# Patient Record
Sex: Male | Born: 1945
Health system: Southern US, Community
[De-identification: ages and names within clinical notes are randomized; demographics above are authoritative.]

## PROBLEM LIST (undated history)

## (undated) DIAGNOSIS — Z860100 Personal history of colon polyps, unspecified: Secondary | ICD-10-CM

## (undated) DIAGNOSIS — I714 Abdominal aortic aneurysm, without rupture, unspecified: Secondary | ICD-10-CM

## (undated) DIAGNOSIS — I2510 Atherosclerotic heart disease of native coronary artery without angina pectoris: Secondary | ICD-10-CM

## (undated) DIAGNOSIS — R1310 Dysphagia, unspecified: Secondary | ICD-10-CM

## (undated) DIAGNOSIS — I6529 Occlusion and stenosis of unspecified carotid artery: Secondary | ICD-10-CM

## (undated) DIAGNOSIS — I499 Cardiac arrhythmia, unspecified: Secondary | ICD-10-CM

## (undated) DIAGNOSIS — F209 Schizophrenia, unspecified: Secondary | ICD-10-CM

## (undated) DIAGNOSIS — I10 Essential (primary) hypertension: Secondary | ICD-10-CM

## (undated) DIAGNOSIS — M549 Dorsalgia, unspecified: Secondary | ICD-10-CM

## (undated) DIAGNOSIS — E785 Hyperlipidemia, unspecified: Secondary | ICD-10-CM

## (undated) DIAGNOSIS — R569 Unspecified convulsions: Secondary | ICD-10-CM

## (undated) DIAGNOSIS — M1990 Unspecified osteoarthritis, unspecified site: Secondary | ICD-10-CM

## (undated) DIAGNOSIS — I639 Cerebral infarction, unspecified: Secondary | ICD-10-CM

## (undated) DIAGNOSIS — Z9289 Personal history of other medical treatment: Secondary | ICD-10-CM

## (undated) DIAGNOSIS — Z8709 Personal history of other diseases of the respiratory system: Secondary | ICD-10-CM

## (undated) DIAGNOSIS — G8929 Other chronic pain: Secondary | ICD-10-CM

## (undated) HISTORY — PX: COLONOSCOPY: SHX174

## (undated) HISTORY — PX: EYE SURGERY: SHX253

## (undated) HISTORY — PX: CARPAL TUNNEL RELEASE: SHX101

## (undated) HISTORY — DX: Abdominal aortic aneurysm, without rupture, unspecified: I71.40

## (undated) HISTORY — DX: Cerebral infarction, unspecified: I63.9

## (undated) HISTORY — DX: Abdominal aortic aneurysm, without rupture: I71.4

## (undated) HISTORY — DX: Occlusion and stenosis of unspecified carotid artery: I65.29

---

## 1986-04-16 DIAGNOSIS — I639 Cerebral infarction, unspecified: Secondary | ICD-10-CM

## 1986-04-16 HISTORY — DX: Cerebral infarction, unspecified: I63.9

## 2011-08-15 DIAGNOSIS — F29 Unspecified psychosis not due to a substance or known physiological condition: Secondary | ICD-10-CM | POA: Diagnosis not present

## 2011-08-15 DIAGNOSIS — E785 Hyperlipidemia, unspecified: Secondary | ICD-10-CM | POA: Diagnosis not present

## 2011-08-15 DIAGNOSIS — I10 Essential (primary) hypertension: Secondary | ICD-10-CM | POA: Diagnosis not present

## 2011-08-15 DIAGNOSIS — F0390 Unspecified dementia without behavioral disturbance: Secondary | ICD-10-CM | POA: Diagnosis not present

## 2011-09-06 DIAGNOSIS — M2040 Other hammer toe(s) (acquired), unspecified foot: Secondary | ICD-10-CM | POA: Diagnosis not present

## 2011-09-06 DIAGNOSIS — M79609 Pain in unspecified limb: Secondary | ICD-10-CM | POA: Diagnosis not present

## 2011-09-06 DIAGNOSIS — B351 Tinea unguium: Secondary | ICD-10-CM | POA: Diagnosis not present

## 2012-05-01 DIAGNOSIS — M79609 Pain in unspecified limb: Secondary | ICD-10-CM | POA: Diagnosis not present

## 2012-05-01 DIAGNOSIS — B351 Tinea unguium: Secondary | ICD-10-CM | POA: Diagnosis not present

## 2012-08-21 DIAGNOSIS — H2510 Age-related nuclear cataract, unspecified eye: Secondary | ICD-10-CM | POA: Diagnosis not present

## 2012-08-21 DIAGNOSIS — H26019 Infantile and juvenile cortical, lamellar, or zonular cataract, unspecified eye: Secondary | ICD-10-CM | POA: Diagnosis not present

## 2012-09-03 DIAGNOSIS — H2510 Age-related nuclear cataract, unspecified eye: Secondary | ICD-10-CM | POA: Diagnosis not present

## 2012-09-17 DIAGNOSIS — E785 Hyperlipidemia, unspecified: Secondary | ICD-10-CM | POA: Diagnosis not present

## 2012-09-17 DIAGNOSIS — I10 Essential (primary) hypertension: Secondary | ICD-10-CM | POA: Diagnosis not present

## 2012-09-17 DIAGNOSIS — F29 Unspecified psychosis not due to a substance or known physiological condition: Secondary | ICD-10-CM | POA: Diagnosis not present

## 2012-09-17 DIAGNOSIS — Z131 Encounter for screening for diabetes mellitus: Secondary | ICD-10-CM | POA: Diagnosis not present

## 2012-09-24 DIAGNOSIS — H2510 Age-related nuclear cataract, unspecified eye: Secondary | ICD-10-CM | POA: Diagnosis not present

## 2012-09-24 DIAGNOSIS — H26019 Infantile and juvenile cortical, lamellar, or zonular cataract, unspecified eye: Secondary | ICD-10-CM | POA: Diagnosis not present

## 2013-03-13 DIAGNOSIS — F29 Unspecified psychosis not due to a substance or known physiological condition: Secondary | ICD-10-CM | POA: Diagnosis not present

## 2013-03-13 DIAGNOSIS — I10 Essential (primary) hypertension: Secondary | ICD-10-CM | POA: Diagnosis not present

## 2013-03-13 DIAGNOSIS — E785 Hyperlipidemia, unspecified: Secondary | ICD-10-CM | POA: Diagnosis not present

## 2013-03-13 DIAGNOSIS — Z1211 Encounter for screening for malignant neoplasm of colon: Secondary | ICD-10-CM | POA: Diagnosis not present

## 2013-04-21 DIAGNOSIS — F205 Residual schizophrenia: Secondary | ICD-10-CM | POA: Diagnosis not present

## 2013-04-24 DIAGNOSIS — D126 Benign neoplasm of colon, unspecified: Secondary | ICD-10-CM | POA: Diagnosis not present

## 2013-04-24 DIAGNOSIS — Z1211 Encounter for screening for malignant neoplasm of colon: Secondary | ICD-10-CM | POA: Diagnosis not present

## 2013-06-12 DIAGNOSIS — E785 Hyperlipidemia, unspecified: Secondary | ICD-10-CM | POA: Diagnosis not present

## 2013-06-12 DIAGNOSIS — M1710 Unilateral primary osteoarthritis, unspecified knee: Secondary | ICD-10-CM | POA: Diagnosis not present

## 2013-06-12 DIAGNOSIS — I10 Essential (primary) hypertension: Secondary | ICD-10-CM | POA: Diagnosis not present

## 2013-06-12 DIAGNOSIS — Z23 Encounter for immunization: Secondary | ICD-10-CM | POA: Diagnosis not present

## 2013-06-23 DIAGNOSIS — F205 Residual schizophrenia: Secondary | ICD-10-CM | POA: Diagnosis not present

## 2013-09-07 DIAGNOSIS — I10 Essential (primary) hypertension: Secondary | ICD-10-CM | POA: Diagnosis not present

## 2013-09-07 DIAGNOSIS — Z131 Encounter for screening for diabetes mellitus: Secondary | ICD-10-CM | POA: Diagnosis not present

## 2013-09-07 DIAGNOSIS — F29 Unspecified psychosis not due to a substance or known physiological condition: Secondary | ICD-10-CM | POA: Diagnosis not present

## 2013-09-07 DIAGNOSIS — E785 Hyperlipidemia, unspecified: Secondary | ICD-10-CM | POA: Diagnosis not present

## 2013-10-12 DIAGNOSIS — R1900 Intra-abdominal and pelvic swelling, mass and lump, unspecified site: Secondary | ICD-10-CM | POA: Diagnosis not present

## 2013-10-12 DIAGNOSIS — I739 Peripheral vascular disease, unspecified: Secondary | ICD-10-CM | POA: Diagnosis not present

## 2013-10-12 DIAGNOSIS — R0602 Shortness of breath: Secondary | ICD-10-CM | POA: Diagnosis not present

## 2013-10-12 DIAGNOSIS — I4891 Unspecified atrial fibrillation: Secondary | ICD-10-CM | POA: Diagnosis not present

## 2013-10-19 ENCOUNTER — Ambulatory Visit (INDEPENDENT_AMBULATORY_CARE_PROVIDER_SITE_OTHER): Payer: Medicare Other | Admitting: Podiatry

## 2013-10-19 ENCOUNTER — Encounter: Payer: Self-pay | Admitting: Podiatry

## 2013-10-19 VITALS — BP 162/76 | HR 86 | Resp 12

## 2013-10-19 DIAGNOSIS — F0390 Unspecified dementia without behavioral disturbance: Secondary | ICD-10-CM | POA: Diagnosis not present

## 2013-10-19 DIAGNOSIS — M79609 Pain in unspecified limb: Secondary | ICD-10-CM

## 2013-10-19 DIAGNOSIS — I10 Essential (primary) hypertension: Secondary | ICD-10-CM | POA: Diagnosis not present

## 2013-10-19 DIAGNOSIS — B351 Tinea unguium: Secondary | ICD-10-CM

## 2013-10-19 DIAGNOSIS — I4892 Unspecified atrial flutter: Secondary | ICD-10-CM | POA: Diagnosis not present

## 2013-10-19 DIAGNOSIS — E785 Hyperlipidemia, unspecified: Secondary | ICD-10-CM | POA: Diagnosis not present

## 2013-10-23 DIAGNOSIS — I4891 Unspecified atrial fibrillation: Secondary | ICD-10-CM | POA: Diagnosis not present

## 2013-10-23 DIAGNOSIS — R0602 Shortness of breath: Secondary | ICD-10-CM | POA: Diagnosis not present

## 2013-10-26 DIAGNOSIS — Z8673 Personal history of transient ischemic attack (TIA), and cerebral infarction without residual deficits: Secondary | ICD-10-CM | POA: Diagnosis not present

## 2013-10-26 DIAGNOSIS — R0989 Other specified symptoms and signs involving the circulatory and respiratory systems: Secondary | ICD-10-CM | POA: Diagnosis not present

## 2013-11-09 DIAGNOSIS — I714 Abdominal aortic aneurysm, without rupture, unspecified: Secondary | ICD-10-CM | POA: Diagnosis not present

## 2013-11-09 DIAGNOSIS — I4891 Unspecified atrial fibrillation: Secondary | ICD-10-CM | POA: Diagnosis not present

## 2013-11-12 DIAGNOSIS — R0602 Shortness of breath: Secondary | ICD-10-CM | POA: Diagnosis not present

## 2013-11-12 DIAGNOSIS — I4891 Unspecified atrial fibrillation: Secondary | ICD-10-CM | POA: Diagnosis not present

## 2013-11-18 DIAGNOSIS — F205 Residual schizophrenia: Secondary | ICD-10-CM | POA: Diagnosis not present

## 2013-11-19 DIAGNOSIS — I739 Peripheral vascular disease, unspecified: Secondary | ICD-10-CM | POA: Diagnosis not present

## 2013-11-19 DIAGNOSIS — I7389 Other specified peripheral vascular diseases: Secondary | ICD-10-CM | POA: Diagnosis not present

## 2013-12-02 ENCOUNTER — Other Ambulatory Visit: Payer: Self-pay | Admitting: Cardiology

## 2013-12-02 DIAGNOSIS — I714 Abdominal aortic aneurysm, without rupture, unspecified: Secondary | ICD-10-CM | POA: Diagnosis not present

## 2013-12-02 DIAGNOSIS — R9439 Abnormal result of other cardiovascular function study: Secondary | ICD-10-CM | POA: Diagnosis not present

## 2013-12-02 DIAGNOSIS — I739 Peripheral vascular disease, unspecified: Secondary | ICD-10-CM | POA: Diagnosis not present

## 2013-12-02 DIAGNOSIS — E785 Hyperlipidemia, unspecified: Secondary | ICD-10-CM | POA: Diagnosis not present

## 2013-12-02 DIAGNOSIS — I6529 Occlusion and stenosis of unspecified carotid artery: Secondary | ICD-10-CM

## 2013-12-02 DIAGNOSIS — I4891 Unspecified atrial fibrillation: Secondary | ICD-10-CM | POA: Diagnosis not present

## 2013-12-09 ENCOUNTER — Ambulatory Visit
Admission: RE | Admit: 2013-12-09 | Discharge: 2013-12-09 | Disposition: A | Discharge status: Home / Self Care | Payer: Medicare Other | Source: Ambulatory Visit | Attending: Cardiology | Admitting: Cardiology

## 2013-12-09 DIAGNOSIS — I714 Abdominal aortic aneurysm, without rupture, unspecified: Secondary | ICD-10-CM

## 2013-12-09 DIAGNOSIS — I658 Occlusion and stenosis of other precerebral arteries: Secondary | ICD-10-CM | POA: Diagnosis not present

## 2013-12-09 DIAGNOSIS — I6529 Occlusion and stenosis of unspecified carotid artery: Secondary | ICD-10-CM

## 2013-12-09 MED ORDER — IOHEXOL 350 MG/ML SOLN
100.0000 mL | Freq: Once | INTRAVENOUS | Status: AC | PRN
  Administered 2013-12-09: 100 mL via INTRAVENOUS

## 2013-12-09 MED ORDER — IOHEXOL 350 MG/ML SOLN
80.0000 mL | Freq: Once | INTRAVENOUS | Status: AC | PRN
  Administered 2013-12-09: 80 mL via INTRAVENOUS

## 2013-12-14 DIAGNOSIS — I10 Essential (primary) hypertension: Secondary | ICD-10-CM | POA: Diagnosis not present

## 2013-12-14 NOTE — Progress Notes (Signed)
Subjective:     Patient ID: Wesley [name], male   DOB: 1946/04/03, 68 y.o.   MRN: [id number]  HPI patient is found to have thick nailbeds 1-5 both feet that he cannot cut and are making it difficult to wear shoe gear   Review of Systems     Objective:   Physical Exam Neurovascular status unchanged with thick nailbeds 1-5 both feet    Assessment:     Mycotic nail infection with pain 1-5 both feet    Plan:     Debridement painful nail bed 1-5 both feet with no bleeding noted

## 2013-12-23 DIAGNOSIS — I4892 Unspecified atrial flutter: Secondary | ICD-10-CM | POA: Diagnosis not present

## 2013-12-23 DIAGNOSIS — R7309 Other abnormal glucose: Secondary | ICD-10-CM | POA: Diagnosis not present

## 2013-12-23 DIAGNOSIS — F29 Unspecified psychosis not due to a substance or known physiological condition: Secondary | ICD-10-CM | POA: Diagnosis not present

## 2013-12-23 DIAGNOSIS — I10 Essential (primary) hypertension: Secondary | ICD-10-CM | POA: Diagnosis not present

## 2013-12-24 DIAGNOSIS — I63239 Cerebral infarction due to unspecified occlusion or stenosis of unspecified carotid arteries: Secondary | ICD-10-CM | POA: Diagnosis not present

## 2013-12-24 DIAGNOSIS — I4891 Unspecified atrial fibrillation: Secondary | ICD-10-CM | POA: Diagnosis not present

## 2013-12-24 DIAGNOSIS — Z8673 Personal history of transient ischemic attack (TIA), and cerebral infarction without residual deficits: Secondary | ICD-10-CM | POA: Diagnosis not present

## 2013-12-24 DIAGNOSIS — R9439 Abnormal result of other cardiovascular function study: Secondary | ICD-10-CM | POA: Diagnosis not present

## 2013-12-30 ENCOUNTER — Encounter: Payer: Self-pay | Admitting: Vascular Surgery

## 2013-12-31 ENCOUNTER — Ambulatory Visit (INDEPENDENT_AMBULATORY_CARE_PROVIDER_SITE_OTHER): Payer: Medicare Other | Admitting: Vascular Surgery

## 2013-12-31 ENCOUNTER — Encounter: Payer: Self-pay | Admitting: Vascular Surgery

## 2013-12-31 VITALS — BP 158/114 | HR 77 | Resp 20 | Ht 73.0 in | Wt 197.5 lb

## 2013-12-31 DIAGNOSIS — I714 Abdominal aortic aneurysm, without rupture, unspecified: Secondary | ICD-10-CM | POA: Diagnosis not present

## 2013-12-31 DIAGNOSIS — I6529 Occlusion and stenosis of unspecified carotid artery: Secondary | ICD-10-CM

## 2013-12-31 NOTE — Progress Notes (Signed)
[facility] HISTORY AND PHYSICAL  Referring: Clayton Lefort, M.D. History of Present Illness:  Patient is a 68 y.o. year old male who presents for evaluation of asymptomatic high-grade carotid stenosis and asymptomatic infrarenal abdominal aortic aneurysm. He denies abdominal or back pain. He denies recent symptoms of TIA amaurosis or stroke. He is a former smoker quit approximately 2 years ago to The patient lives with his son that overall performance his daily activities independently. He does have a history of abdominal aortic aneurysm in his sister. Other medical problems include psychiatric history with possible history of schizophrenia, "heat stroke" in 1987 details of this event are unavailable, chronic right upper extremity tremor and weakness since his "heat stroke"  Past Medical History  Diagnosis Date  . AAA (abdominal aortic aneurysm)   . Carotid artery occlusion   . Stroke 04/16/1986    Past Surgical History  Procedure Laterality Date  . Nerve block limb Right     Social History History  Substance Use Topics  . Smoking status: Former Smoker -- 1.00 packs/day for 58 years    Types: Cigarettes    Quit date: 12/29/2011  . Smokeless tobacco: Not on file  . Alcohol Use: No    Family History Family History  Problem Relation Age of Onset  . Cancer Father   . Hyperlipidemia Father   . Hypertension Father   . Heart disease Sister   . AAA (abdominal aortic aneurysm) Sister     Allergies  No Known Allergies   Current Outpatient Prescriptions  Medication Sig Dispense Refill  . atenolol (TENORMIN) 50 MG tablet       . benazepril-hydrochlorthiazide (LOTENSIN HCT) 10-12.5 MG per tablet       . cloNIDine (CATAPRES) 0.1 MG tablet       . haloperidol (HALDOL) 5 MG tablet       . simvastatin (ZOCOR) 20 MG tablet       . trihexyphenidyl (ARTANE) 2 MG tablet       . benztropine (COGENTIN) 2 MG tablet        No current facility-administered  medications for this visit.    ROS:   General:  No weight loss, Fever, chills  HEENT: No recent headaches, no nasal bleeding, no visual changes, no sore throat  Neurologic: No dizziness, blackouts, seizures. No recent symptoms of stroke or mini- stroke. No recent episodes of slurred speech, or temporary blindness.  Cardiac: No recent episodes of chest pain/pressure, no shortness of breath at rest.  + shortness of breath with exertion.  Denies history of atrial fibrillation or irregular heartbeat  Vascular: No history of rest pain in feet.  No history of claudication.  No history of non-healing ulcer, No history of DVT   Pulmonary: No home oxygen, no productive cough, no hemoptysis,  No asthma or wheezing  Musculoskeletal:  [ ]  Arthritis, [ ]  Low back pain,  [ ]  Joint pain  Hematologic:No history of hypercoagulable state.  No history of easy bleeding.  No history of anemia  Gastrointestinal: No hematochezia or melena,  No gastroesophageal reflux, no trouble swallowing  Urinary: [ ]  chronic Kidney disease, [ ]  on HD - [ ]  MWF or [ ]  TTHS, [ ]  Burning with urination, [ ]  Frequent urination, [ ]  Difficulty urinating;   Skin: No rashes  Psychological: No history of anxiety,  No history of depression   Physical Examination  Filed Vitals:   12/31/13 1342 12/31/13 1345  BP: 169/92 158/114  Pulse:  77   Resp: 20   Height: 6\' 1"  (1.854 m)   Weight: 197 lb 8 oz (89.585 kg)     Body mass index is 26.06 kg/(m^2).  General:  Alert and oriented, no acute distress HEENT: Normal Neck: No bruit or JVD Pulmonary: Clear to auscultation bilaterally Cardiac: Regular Rate and Rhythm without murmur Abdomen: Soft, non-tender, non-distended, no mass Skin: No rash Extremity Pulses:  2+ radial, brachial, femoral, dorsalis pedis, posterior tibial pulses bilaterally Musculoskeletal: No deformity or edema  Neurologic: Upper and lower extremity motor 5/5 and symmetric, tremor right hand  DATA:   CT scan of abdomen and pelvis is reviewed. This shows a 5.7 cm abdominal aortic aneurysm. The neck is 2.6 cm. The right common iliac artery is ectatic at 16 mm. The left external iliac artery is slightly narrowed. 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 CT Angio of the head and neck is also reviewed. This shows a 75% right internal carotid artery stenosis and an 85% left internal carotid artery stenosis   ASSESSMENT:  #1 asymptomatic 5.7 cm abdominal aortic aneurysm #2 bilateral high grade internal carotid artery stenosis, asymptomatic   PLAN:  On preliminary review the CT images the patient appears to be a percutaneous aneurysm stent graft candidate. We will schedule him for cardiac risk stratification by Dr. Clayton Lefort. After his aneurysm has been addressed we will consider whether or not to do elective carotid endarterectomy versus carotid stenting. I discussed all the image findings as well as the pathophysiology of carotid stenosis an abdominal aortic aneurysm with the patient and his son today. He understand agree to proceed aneurysm repair all potential by addressing the carotid artery stenosis. The patient also would benefit from being on antiplatelet therapy in the form of aspirin.  Ruta Hinds, MD [facility] Office: [phone] Pager: [phone]

## 2014-01-04 ENCOUNTER — Encounter: Payer: Self-pay | Admitting: Vascular Surgery

## 2014-01-05 ENCOUNTER — Other Ambulatory Visit: Payer: Self-pay

## 2014-01-07 ENCOUNTER — Encounter (HOSPITAL_COMMUNITY): Payer: Self-pay | Admitting: Pharmacy Technician

## 2014-01-13 DIAGNOSIS — R0602 Shortness of breath: Secondary | ICD-10-CM | POA: Diagnosis not present

## 2014-01-18 DIAGNOSIS — Z8673 Personal history of transient ischemic attack (TIA), and cerebral infarction without residual deficits: Secondary | ICD-10-CM | POA: Diagnosis not present

## 2014-01-18 DIAGNOSIS — I4891 Unspecified atrial fibrillation: Secondary | ICD-10-CM | POA: Diagnosis not present

## 2014-01-18 DIAGNOSIS — I2510 Atherosclerotic heart disease of native coronary artery without angina pectoris: Secondary | ICD-10-CM | POA: Diagnosis not present

## 2014-01-18 DIAGNOSIS — I70219 Atherosclerosis of native arteries of extremities with intermittent claudication, unspecified extremity: Secondary | ICD-10-CM | POA: Diagnosis not present

## 2014-01-18 DIAGNOSIS — I119 Hypertensive heart disease without heart failure: Secondary | ICD-10-CM | POA: Diagnosis not present

## 2014-01-18 DIAGNOSIS — E785 Hyperlipidemia, unspecified: Secondary | ICD-10-CM | POA: Diagnosis not present

## 2014-01-18 DIAGNOSIS — F0390 Unspecified dementia without behavioral disturbance: Secondary | ICD-10-CM | POA: Diagnosis not present

## 2014-01-18 MED ORDER — DEXTROSE 5 % IV SOLN
1.5000 g | INTRAVENOUS | Status: DC
  Filled 2014-01-18: qty 1.5

## 2014-01-18 MED ORDER — SODIUM CHLORIDE 0.9 % IV SOLN: INTRAVENOUS | Status: DC

## 2014-01-19 ENCOUNTER — Encounter (HOSPITAL_COMMUNITY): Payer: Self-pay | Admitting: General Practice

## 2014-01-19 ENCOUNTER — Ambulatory Visit (HOSPITAL_COMMUNITY)
Admission: RE | Admit: 2014-01-19 | Discharge: 2014-01-20 | Disposition: A | Discharge status: Home / Self Care | Payer: Medicare Other | Source: Ambulatory Visit | Attending: Cardiology | Admitting: Cardiology

## 2014-01-19 ENCOUNTER — Ambulatory Visit (HOSPITAL_COMMUNITY): Payer: Medicare Other

## 2014-01-19 ENCOUNTER — Other Ambulatory Visit (HOSPITAL_COMMUNITY): Payer: Medicare Other

## 2014-01-19 ENCOUNTER — Encounter (HOSPITAL_COMMUNITY)
Admission: RE | Disposition: A | Discharge status: Home / Self Care | Payer: Medicare Other | Source: Ambulatory Visit | Attending: Cardiology

## 2014-01-19 ENCOUNTER — Other Ambulatory Visit: Payer: Self-pay

## 2014-01-19 DIAGNOSIS — Z9861 Coronary angioplasty status: Secondary | ICD-10-CM

## 2014-01-19 DIAGNOSIS — J40 Bronchitis, not specified as acute or chronic: Secondary | ICD-10-CM | POA: Diagnosis not present

## 2014-01-19 DIAGNOSIS — I2510 Atherosclerotic heart disease of native coronary artery without angina pectoris: Secondary | ICD-10-CM | POA: Insufficient documentation

## 2014-01-19 DIAGNOSIS — E785 Hyperlipidemia, unspecified: Secondary | ICD-10-CM | POA: Insufficient documentation

## 2014-01-19 DIAGNOSIS — Z8673 Personal history of transient ischemic attack (TIA), and cerebral infarction without residual deficits: Secondary | ICD-10-CM | POA: Insufficient documentation

## 2014-01-19 DIAGNOSIS — R9439 Abnormal result of other cardiovascular function study: Secondary | ICD-10-CM | POA: Diagnosis not present

## 2014-01-19 DIAGNOSIS — I119 Hypertensive heart disease without heart failure: Secondary | ICD-10-CM | POA: Insufficient documentation

## 2014-01-19 DIAGNOSIS — I4891 Unspecified atrial fibrillation: Secondary | ICD-10-CM | POA: Diagnosis not present

## 2014-01-19 DIAGNOSIS — F0390 Unspecified dementia without behavioral disturbance: Secondary | ICD-10-CM | POA: Diagnosis not present

## 2014-01-19 DIAGNOSIS — Z01818 Encounter for other preprocedural examination: Secondary | ICD-10-CM | POA: Diagnosis not present

## 2014-01-19 DIAGNOSIS — I70219 Atherosclerosis of native arteries of extremities with intermittent claudication, unspecified extremity: Secondary | ICD-10-CM | POA: Insufficient documentation

## 2014-01-19 HISTORY — DX: Unspecified osteoarthritis, unspecified site: M19.90

## 2014-01-19 HISTORY — PX: PERCUTANEOUS CORONARY STENT INTERVENTION (PCI-S): SHX5485

## 2014-01-19 HISTORY — DX: Other chronic pain: G89.29

## 2014-01-19 HISTORY — DX: Essential (primary) hypertension: I10

## 2014-01-19 HISTORY — PX: LEFT HEART CATHETERIZATION WITH CORONARY ANGIOGRAM: SHX5451

## 2014-01-19 HISTORY — DX: Dorsalgia, unspecified: M54.9

## 2014-01-19 HISTORY — DX: Atherosclerotic heart disease of native coronary artery without angina pectoris: I25.10

## 2014-01-19 HISTORY — PX: CORONARY ANGIOPLASTY WITH STENT PLACEMENT: SHX49

## 2014-01-19 LAB — URINALYSIS, ROUTINE W REFLEX MICROSCOPIC
Bilirubin Urine: NEGATIVE
Glucose, UA: NEGATIVE mg/dL
Hgb urine dipstick: NEGATIVE
Ketones, ur: NEGATIVE mg/dL
Leukocytes, UA: NEGATIVE
Nitrite: NEGATIVE
Protein, ur: NEGATIVE mg/dL
Specific Gravity, Urine: 1.014 (ref 1.005–1.030)
Urobilinogen, UA: 1 mg/dL (ref 0.0–1.0)
pH: 6.5 (ref 5.0–8.0)

## 2014-01-19 LAB — BLOOD GAS, ARTERIAL
ACID-BASE DEFICIT: 0.9 mmol/L (ref 0.0–2.0)
Bicarbonate: 23.4 mEq/L (ref 20.0–24.0)
Drawn by: 274071
O2 Content: 2 L/min
O2 Saturation: 97.2 %
PCO2 ART: 39.4 mmHg (ref 35.0–45.0)
Patient temperature: 98.6
TCO2: 24.6 mmol/L (ref 0–100)
pH, Arterial: 7.391 (ref 7.350–7.450)
pO2, Arterial: 82.5 mmHg (ref 80.0–100.0)

## 2014-01-19 LAB — PROTIME-INR
INR: 1.25 (ref 0.00–1.49)
PROTHROMBIN TIME: 15.4 s — AB (ref 11.6–15.2)

## 2014-01-19 LAB — COMPREHENSIVE METABOLIC PANEL
ALK PHOS: 58 U/L (ref 39–117)
ALT: 16 U/L (ref 0–53)
AST: 17 U/L (ref 0–37)
Albumin: 3.3 g/dL — ABNORMAL LOW (ref 3.5–5.2)
BUN: 11 mg/dL (ref 6–23)
CALCIUM: 8.7 mg/dL (ref 8.4–10.5)
CO2: 21 mEq/L (ref 19–32)
Chloride: 105 mEq/L (ref 96–112)
Creatinine, Ser: 0.95 mg/dL (ref 0.50–1.35)
GFR calc Af Amer: >90 mL/min (ref >90)
GFR calc non Af Amer: 83 mL/min — ABNORMAL LOW (ref >90)
Glucose, Bld: 164 mg/dL — ABNORMAL HIGH (ref 70–99)
Potassium: 3.9 mEq/L (ref 3.7–5.3)
Sodium: 140 mEq/L (ref 137–147)
TOTAL PROTEIN: 6.9 g/dL (ref 6.0–8.3)
Total Bilirubin: 0.5 mg/dL (ref 0.3–1.2)

## 2014-01-19 LAB — SURGICAL PCR SCREEN
MRSA, PCR: NEGATIVE
Staphylococcus aureus: NEGATIVE

## 2014-01-19 LAB — TYPE AND SCREEN
ABO/RH(D): O POS
ANTIBODY SCREEN: NEGATIVE

## 2014-01-19 LAB — CBC
HEMATOCRIT: 39.6 % (ref 39.0–52.0)
HEMOGLOBIN: 13.2 g/dL (ref 13.0–17.0)
MCH: 29.1 pg (ref 26.0–34.0)
MCHC: 33.3 g/dL (ref 30.0–36.0)
MCV: 87.2 fL (ref 78.0–100.0)
Platelets: 169 10*3/uL (ref 150–400)
RBC: 4.54 MIL/uL (ref 4.22–5.81)
RDW: 13.5 % (ref 11.5–15.5)
WBC: 4.5 10*3/uL (ref 4.0–10.5)

## 2014-01-19 LAB — POCT ACTIVATED CLOTTING TIME: ACTIVATED CLOTTING TIME: 523 s

## 2014-01-19 LAB — APTT: aPTT: 45 seconds — ABNORMAL HIGH (ref 24–37)

## 2014-01-19 LAB — ABO/RH: ABO/RH(D): O POS

## 2014-01-19 SURGERY — LEFT HEART CATHETERIZATION WITH CORONARY ANGIOGRAM
Anesthesia: LOCAL

## 2014-01-19 MED ORDER — ASPIRIN 81 MG PO CHEW: 81.0000 mg | CHEWABLE_TABLET | ORAL | Status: DC

## 2014-01-19 MED ORDER — CLOPIDOGREL BISULFATE 75 MG PO TABS
75.0000 mg | ORAL_TABLET | Freq: Every day | ORAL | Status: DC
  Administered 2014-01-20: 09:00:00 75 mg via ORAL
  Filled 2014-01-19: qty 1

## 2014-01-19 MED ORDER — SODIUM CHLORIDE 0.9 % IJ SOLN: 3.0000 mL | INTRAMUSCULAR | Status: DC | PRN

## 2014-01-19 MED ORDER — SODIUM CHLORIDE 0.9 % IV SOLN: 250.0000 mL | INTRAVENOUS | Status: DC | PRN

## 2014-01-19 MED ORDER — LABETALOL HCL 5 MG/ML IV SOLN
15.0000 mg | INTRAVENOUS | Status: DC | PRN
  Administered 2014-01-19 (×2): 15 mg via INTRAVENOUS
  Filled 2014-01-19: qty 4

## 2014-01-19 MED ORDER — EDOXABAN TOSYLATE 60 MG PO TABS
60.0000 mg | ORAL_TABLET | Freq: Every day | ORAL | Status: DC
  Administered 2014-01-19 – 2014-01-20 (×2): 60 mg via ORAL
  Filled 2014-01-19 (×2): qty 60

## 2014-01-19 MED ORDER — DIAZEPAM 2 MG PO TABS
2.0000 mg | ORAL_TABLET | Freq: Four times a day (QID) | ORAL | Status: DC | PRN
  Administered 2014-01-19: 22:00:00 2 mg via ORAL
  Filled 2014-01-19: qty 1

## 2014-01-19 MED ORDER — CLOPIDOGREL BISULFATE 300 MG PO TABS
ORAL_TABLET | ORAL | Status: AC
Start: 2014-01-19 — End: 2014-01-19
  Filled 2014-01-19: qty 1

## 2014-01-19 MED ORDER — SODIUM CHLORIDE 0.9 % IV SOLN: INTRAVENOUS | Status: DC

## 2014-01-19 MED ORDER — NITROGLYCERIN 0.2 MG/ML ON CALL CATH LAB
INTRAVENOUS | Status: AC
  Filled 2014-01-19: qty 1

## 2014-01-19 MED ORDER — LIDOCAINE HCL (PF) 1 % IJ SOLN
INTRAMUSCULAR | Status: AC
Start: 2014-01-19 — End: 2014-01-19
  Filled 2014-01-19: qty 30

## 2014-01-19 MED ORDER — ATENOLOL 100 MG PO TABS
100.0000 mg | ORAL_TABLET | Freq: Every day | ORAL | Status: DC
  Administered 2014-01-19: 100 mg via ORAL
  Filled 2014-01-19: qty 1

## 2014-01-19 MED ORDER — HEPARIN (PORCINE) IN NACL 2-0.9 UNIT/ML-% IJ SOLN
INTRAMUSCULAR | Status: AC
  Filled 2014-01-19: qty 1500

## 2014-01-19 MED ORDER — TRIHEXYPHENIDYL HCL 2 MG PO TABS
2.0000 mg | ORAL_TABLET | Freq: Every day | ORAL | Status: DC
  Administered 2014-01-19 – 2014-01-20 (×2): 2 mg via ORAL
  Filled 2014-01-19 (×2): qty 1

## 2014-01-19 MED ORDER — MIDAZOLAM HCL 2 MG/2ML IJ SOLN
INTRAMUSCULAR | Status: AC
  Filled 2014-01-19: qty 2

## 2014-01-19 MED ORDER — ASPIRIN 81 MG PO CHEW
CHEWABLE_TABLET | ORAL | Status: AC
  Administered 2014-01-19: 81 mg
  Filled 2014-01-19: qty 1

## 2014-01-19 MED ORDER — CLOPIDOGREL BISULFATE 300 MG PO TABS
ORAL_TABLET | ORAL | Status: AC
  Filled 2014-01-19: qty 1

## 2014-01-19 MED ORDER — SODIUM CHLORIDE 0.9 % IJ SOLN: 3.0000 mL | Freq: Two times a day (BID) | INTRAMUSCULAR | Status: DC

## 2014-01-19 MED ORDER — FENTANYL CITRATE 0.05 MG/ML IJ SOLN
INTRAMUSCULAR | Status: AC
  Filled 2014-01-19: qty 2

## 2014-01-19 MED ORDER — CLONIDINE HCL 0.1 MG PO TABS
0.1000 mg | ORAL_TABLET | Freq: Every day | ORAL | Status: DC
  Administered 2014-01-19 – 2014-01-20 (×2): 0.1 mg via ORAL
  Filled 2014-01-19 (×2): qty 1

## 2014-01-19 MED ORDER — SODIUM CHLORIDE 0.9 % IV BOLUS (SEPSIS)
500.0000 mL | Freq: Once | INTRAVENOUS | Status: AC
  Administered 2014-01-19: 500 mL via INTRAVENOUS

## 2014-01-19 MED ORDER — BIVALIRUDIN 250 MG IV SOLR
INTRAVENOUS | Status: AC
Start: 2014-01-19 — End: 2014-01-19
  Filled 2014-01-19: qty 250

## 2014-01-19 MED ORDER — LABETALOL HCL 5 MG/ML IV SOLN
INTRAVENOUS | Status: AC
  Filled 2014-01-19: qty 4

## 2014-01-19 MED ORDER — HEPARIN SODIUM (PORCINE) 1000 UNIT/ML IJ SOLN
INTRAMUSCULAR | Status: AC
  Filled 2014-01-19: qty 1

## 2014-01-19 MED ORDER — HYDRALAZINE HCL 20 MG/ML IJ SOLN
10.0000 mg | INTRAMUSCULAR | Status: DC | PRN
  Administered 2014-01-19: 22:00:00 10 mg via INTRAVENOUS
  Filled 2014-01-19: qty 1

## 2014-01-19 MED ORDER — VERAPAMIL HCL 2.5 MG/ML IV SOLN
INTRAVENOUS | Status: AC
  Filled 2014-01-19: qty 2

## 2014-01-19 MED ORDER — HALOPERIDOL 2 MG PO TABS
2.5000 mg | ORAL_TABLET | Freq: Every day | ORAL | Status: DC
  Administered 2014-01-19 – 2014-01-20 (×2): 2.5 mg via ORAL
  Filled 2014-01-19 (×3): qty 1

## 2014-01-19 MED ORDER — SODIUM CHLORIDE 0.9 % IV SOLN: 1.0000 mL/kg/h | INTRAVENOUS | Status: AC

## 2014-01-19 MED ORDER — SIMVASTATIN 40 MG PO TABS
40.0000 mg | ORAL_TABLET | Freq: Every day | ORAL | Status: DC
  Administered 2014-01-19 – 2014-01-20 (×2): 40 mg via ORAL
  Filled 2014-01-19 (×2): qty 1

## 2014-01-19 NOTE — Interval H&P Note (Signed)
History and Physical Interval Note:  01/19/2014 7:42 AM  Wesley [name]  has presented today for surgery, with the diagnosis of cad  The various methods of treatment have been discussed with the patient and family. After consideration of risks, benefits and other options for treatment, the patient has consented to  Procedure(s): LEFT HEART CATHETERIZATION WITH CORONARY ANGIOGRAM (N/A) and possible PCI as a surgical intervention .  The patient's history has been reviewed, patient examined, no change in status, stable for surgery.  I have reviewed the patient's chart and labs.  Questions were answered to the patient's satisfaction.   .Patient consents to having students observing: Raoul Pitch,  Laverda Page

## 2014-01-19 NOTE — Progress Notes (Signed)
TR BAND REMOVAL  LOCATION:    right radial  DEFLATED PER PROTOCOL:    yes  TIME BAND OFF / DRESSING APPLIED:    1245   SITE UPON ARRIVAL:    Level 0  SITE AFTER BAND REMOVAL:    Level 0  REVERSE ALLEN'S TEST:     positive  CIRCULATION SENSATION AND MOVEMENT:    Within Normal Limits   yes  COMMENTS:   TRB air deflation completed on arrival to floor.site level 0

## 2014-01-19 NOTE — H&P (Signed)
  Please see office visit notes for complete details of HPI.  

## 2014-01-19 NOTE — CV Procedure (Addendum)
Procedure performed:  Left heart catheterization including hemodynamic monitoring of the left ventricle, LV gram. Selective right and left coronary arteriography. PTCA and stenting of the distal RCA with implantation of a 4.0 x 24 mm Rebel non-drug-eluting stent for a high-grade 99% stenosis..  Indication: Patient is a 68 year-old [place] American male with history of hypertension,  hyperlipidemia, mild dementia, cerebrovascular disease, large abdominal pannus, schedule for a abdominal aortic endograft placement in a week to 10 days, chronic atrial fibrillation. Outpatient stress testing had revealed severe inferior and inferolateral ischemia, moderate to large area of distribution. Given his high risk clinical features, although patient does not have symptoms of angina, patient also has peripheral arterial disease with claudication, felt it was necessary to proceed with coronary angiography to evaluate for proximal vessel, large vessel disease. Anticoagulation/antiplatelet regimen was previously discussed with Dr. Ruta Hinds.  Hemodynamic data: Left ventricular pressure was 91/6 with LVEDP of 11 mm mercury. Aortic pressure was 113/75 with a mean of 90 mm mercury. There was no pressure gradient across the aortic valve.   Left ventricle: Performed in the RAO projection revealed LVEF of 65 %. There was no significant MR. no wall motion abnormality.  Right coronary artery: Dominant. If you very large caliber vessel. There is a high-grade 99% stenosis followed by a 60-70% stenosis in the distal RCA. Very large PL branch and a moderate size PDA. There is mild diffuse luminal irregularity in the proximal to mid segments. There were no collaterals to the right coronary artery from the contralateral side.  Left main coronary artery is large and normal.  Circumflex coronary artery: A moderate to small sized vessel giving origin to a moderate sized obtuse marginal 1. Mild luminal noted  LAD:  LAD gives  origin to several small diagonals, D3 his small to moderate.  LAD has mild diffuse luminal irregularities.   Ramus intermediate: Very large vessel, giving origin to a large secondary branch, a second branch has multiple branches. Mild luminal irregularity evident.  Impression: Single vessel coronary artery disease, a very large RCA distal stenosis of 99%, no collaterals were evident to the right coronary artery from the left system. Ejection fraction was normal. Felt necessary to proceed with intervention due to his multiple medical comorbidities, although patient does not have symptoms of angina, has intermediate risk stress test. Probably patient asymptomatic due to presence of claudication, prior CVA, multiple medical comorbidities.  Interventional data: Successful PTCA and stenting of the distal RCA with implantation of a 4.0 x 24 mm Rebel Non-DES.  Will need antiplatelet therapy with Plavix and will continue Savyaso for A. Fib.  No ASA due to increased risk of bleed.  Technique of diagnostic cardiac catheterization:  Under sterile precautions using a 6 French right radial  arterial access, a 6 French sheath was introduced into the right radial artery. A 5 [place] Tig 4 catheter was advanced into the ascending aorta selective  right coronary artery and left coronary artery was cannulated and angiography was performed in multiple views. The catheter was pulled back Out of the body over exchange length J-wire. Same Catheter was used to perform LV gram which was performed in RAO projection. Catheter exchanged out of the body over J-Wire. NO immediate complications noted.  Patient tolerated the procedure well.   Technique of intervention:  Using a 6 [place] Ikari Right 1.0  guide catheter the RCA  coronary  was selected and cannulated. Using Angiomax for anticoagulation, I utilized a cougar 0.014 x 190 cm guidewire and  across the right coronary artery without any difficulty. I placed the tip of the wire  into the distal  coronary artery. Angiography was performed.   Then I utilized a 3.5 x 15 mm [place] Euphora balloon , I performed balloon angioplasty at 14 pressure x 1 for 45 seconds each. There was persistence of stenosis after the angioplasty, I try to readdress the balloon back to the site of the stenosis, I was unable to pass the balloon. Hence I utilized a 3.0 x 15 mm [place] Euphora and perform balloon angioplasty at 20 atmospheres for 40 seconds. Excellent result was evident post-balloon angioplasty mild luminal irregularity at the site of stenosis. I proceeded with implantation of a 4.0 x 24 mm Rebel non- drug-eluting stent into the distal RCA. The stent was deployed at 11 atmospheric pressure for 50  seconds.  Post-angioplasty results were excellent with 0% residual stenoses and TIMI-3 flow was maintained. There was no evidence of edge dissection. The guidewire was withdrawn out of the body and the guide catheter was engaged and pulled out of the body over the J-wire the was no immediate complication. Patient tolerated the procedure well. Hemostasis achieved with TR band. A total of 110 cc of contrast was utilized for diagnostic and interventional procedure.  Disposition: Patient will be discharged in morning unless complications with out-patient follow up.

## 2014-01-19 NOTE — Interval H&P Note (Signed)
History and Physical Interval Note:  01/19/2014 7:41 AM  Wesley [name]  has presented today for surgery, with the diagnosis of cad  The various methods of treatment have been discussed with the patient and family. After consideration of risks, benefits and other options for treatment, the patient has consented to  Procedure(s): LEFT HEART CATHETERIZATION WITH CORONARY ANGIOGRAM (N/A) and possible PCI as a surgical intervention .  The patient's history has been reviewed, patient examined, no change in status, stable for surgery.  I have reviewed the patient's chart and labs.  Questions were answered to the patient's satisfaction.     Laverda Page

## 2014-01-20 DIAGNOSIS — I70219 Atherosclerosis of native arteries of extremities with intermittent claudication, unspecified extremity: Secondary | ICD-10-CM | POA: Diagnosis not present

## 2014-01-20 DIAGNOSIS — F0390 Unspecified dementia without behavioral disturbance: Secondary | ICD-10-CM | POA: Diagnosis not present

## 2014-01-20 DIAGNOSIS — E785 Hyperlipidemia, unspecified: Secondary | ICD-10-CM | POA: Diagnosis not present

## 2014-01-20 DIAGNOSIS — I2510 Atherosclerotic heart disease of native coronary artery without angina pectoris: Secondary | ICD-10-CM | POA: Diagnosis not present

## 2014-01-20 LAB — CBC
HCT: 42.8 % (ref 39.0–52.0)
Hemoglobin: 14.2 g/dL (ref 13.0–17.0)
MCH: 29.5 pg (ref 26.0–34.0)
MCHC: 33.2 g/dL (ref 30.0–36.0)
MCV: 89 fL (ref 78.0–100.0)
PLATELETS: 181 10*3/uL (ref 150–400)
RBC: 4.81 MIL/uL (ref 4.22–5.81)
RDW: 13.8 % (ref 11.5–15.5)
WBC: 5.4 10*3/uL (ref 4.0–10.5)

## 2014-01-20 LAB — BASIC METABOLIC PANEL
BUN: 11 mg/dL (ref 6–23)
CALCIUM: 9.5 mg/dL (ref 8.4–10.5)
CO2: 21 meq/L (ref 19–32)
Chloride: 103 mEq/L (ref 96–112)
Creatinine, Ser: 0.95 mg/dL (ref 0.50–1.35)
GFR calc Af Amer: >90 mL/min (ref >90)
GFR, EST NON AFRICAN AMERICAN: 83 mL/min — AB (ref >90)
Glucose, Bld: 134 mg/dL — ABNORMAL HIGH (ref 70–99)
Potassium: 3.6 mEq/L — ABNORMAL LOW (ref 3.7–5.3)
SODIUM: 142 meq/L (ref 137–147)

## 2014-01-20 MED ORDER — CLOPIDOGREL BISULFATE 75 MG PO TABS
75.0000 mg | ORAL_TABLET | Freq: Every day | ORAL | Status: DC
Start: 2014-01-20 — End: 2019-05-04

## 2014-01-20 MED ORDER — ATENOLOL 100 MG PO TABS: 100.0000 mg | ORAL_TABLET | Freq: Two times a day (BID) | ORAL | Status: DC

## 2014-01-20 MED ORDER — ATENOLOL 100 MG PO TABS
100.0000 mg | ORAL_TABLET | Freq: Once | ORAL | Status: AC
  Administered 2014-01-20: 100 mg via ORAL
  Filled 2014-01-20: qty 1

## 2014-01-20 MED ORDER — ATENOLOL 100 MG PO TABS
100.0000 mg | ORAL_TABLET | Freq: Two times a day (BID) | ORAL | Status: DC
  Administered 2014-01-20: 100 mg via ORAL
  Filled 2014-01-20 (×3): qty 1

## 2014-01-20 MED ORDER — HYDRALAZINE HCL 50 MG PO TABS
50.0000 mg | ORAL_TABLET | Freq: Once | ORAL | Status: DC
Start: 2014-01-20 — End: 2014-02-01

## 2014-01-20 MED ORDER — ISOSORBIDE MONONITRATE ER 60 MG PO TB24
60.0000 mg | ORAL_TABLET | Freq: Every day | ORAL | Status: DC
Start: 2014-01-20 — End: 2014-01-27

## 2014-01-20 MED ORDER — HYDRALAZINE HCL 25 MG PO TABS
25.0000 mg | ORAL_TABLET | Freq: Three times a day (TID) | ORAL | Status: DC
  Filled 2014-01-20 (×4): qty 1

## 2014-01-20 MED ORDER — HYDRALAZINE HCL 50 MG PO TABS
50.0000 mg | ORAL_TABLET | Freq: Once | ORAL | Status: AC
  Administered 2014-01-20: 09:00:00 50 mg via ORAL
  Filled 2014-01-20: qty 1

## 2014-01-20 MED FILL — Sodium Chloride IV Soln 0.9%: INTRAVENOUS | Qty: 50 | Status: AC

## 2014-01-20 NOTE — Discharge Summary (Signed)
Physician Discharge Summary  Patient ID: Wesley [name] MRN: [id number] DOB/AGE: 1946-02-10 68 y.o.  Admit date: 01/19/2014 Discharge date: 01/20/2014  Primary Discharge Diagnosis CAD of the native vessels, status post stenting of dominant large mid RCA with implantation of a 4.0 x 24 mm Rebel non-DES. Secondary Discharge Diagnosis Hypertension with hypertensive heart disease Peripheral arterial disease Abdominal aortic aneurysm, patient scheduled for endograft placement on 02/01/2014 by Dr. Ruta Hinds. Chronic atrial fibrillation History of CVA Mild dementia  Significant Diagnostic Studies: 01/19/2014: Hemodynamic data:  Left ventricular pressure was 91/6 with LVEDP of 11 mm mercury. Aortic pressure was 113/75 with a mean of 90 mm mercury. There was no pressure gradient across the aortic valve.  Left ventricle: Performed in the RAO projection revealed LVEF of 65 %. There was no significant MR. no wall motion abnormality.  Right coronary artery: Dominant. If you very large caliber vessel. There is a high-grade 99% stenosis followed by a 60-70% stenosis in the distal RCA. Very large PL branch and a moderate size PDA. There is mild diffuse luminal irregularity in the proximal to mid segments. There were no collaterals to the right coronary artery from the contralateral side.  Left main coronary artery is large and normal.  Circumflex coronary artery: A moderate to small sized vessel giving origin to a moderate sized obtuse marginal 1. Mild luminal noted  LAD: LAD gives origin to several small diagonals, D3 his small to moderate. LAD has mild diffuse luminal irregularities.  Ramus intermediate: Very large vessel, giving origin to a large secondary branch, a second branch has multiple branches. Mild luminal irregularity evident.  Impression: Single vessel coronary artery disease, a very large RCA distal stenosis of 99%, no collaterals were evident to the right coronary artery from the left  system. Ejection fraction was normal. Felt necessary to proceed with intervention due to his multiple medical comorbidities, although patient does not have symptoms of angina, has intermediate risk stress test. Probably patient asymptomatic due to presence of claudication, prior CVA, multiple medical comorbidities.  Interventional data: Successful PTCA and stenting of the distal RCA with implantation of a 4.0 x 24 mm Rebel Non-DES. Will need antiplatelet therapy with Plavix and will continue Savyaso for A. Fib. No ASA due to increased risk of bleed.    Hospital Course:  Patient is a 68 year-old [place] American male with history of hypertension, hyperlipidemia, mild dementia, cerebrovascular disease, large abdominal pannus, schedule for a abdominal aortic endograft placement in a week to 10 days, chronic atrial fibrillation. Outpatient stress testing had revealed severe inferior and inferolateral ischemia, moderate to large area of distribution. Given his high risk clinical features, although patient does not have symptoms of angina, patient also has peripheral arterial disease with claudication, felt it was necessary to proceed with coronary angiography to evaluate for proximal vessel, large vessel disease. Anticoagulation/antiplatelet regimen was previously discussed with Dr. Juanda Crumble fields. Patient then went coronary angiography via right radial arterial access. He was found to have a focal high-grade stenosis in the very large right coronary artery with subtotal/95% stenosis, there were no collaterals from the left system to the right coronary artery. Hence was felt that we should proceed with angioplasty.  Patient tolerated the procedure well. Excellent result was evident post procedure. The following morning she was walking with the help of cardiac rehabilitation without any complications. His blood pressure continued to remain elevated, hence the dosage of atenolol was increased from 100 mg daily to  100 mg twice a day and hydralazine  50 mg 3 times a day and isosorbide mononitrate 90 mg by mouth daily were added upon discharge along with Plavix 75 mg by mouth daily. He will continue Savaysa for anticoagulation for atrial fibrillation which will be held for 48 hours prior to endovascular stent placement for AAA.  Recommendations on discharge: Do not drive for two days. Keep right hand straight as possible and not use frequently.  Stop Savaysa after the evening dose on Friday 01/29/2014 for Aortic procedure by Dr. Oneida Alar.  Discharge Exam: Blood pressure 189/124, pulse 105, temperature 98.1 F (36.7 C), temperature source Oral, resp. rate 18, height 6\' 1"  (1.854 m), weight 90.5 kg (199 lb 8.3 oz), SpO2 99.00%.     General appearance: alert, cooperative, appears stated age and no distress Resp: clear to auscultation bilaterally Cardio: S1 we will, S2 is normal. There is no gallop or murmur. No rub. GI: soft, non-tender; bowel sounds normal; no masses,  no organomegaly Extremities: extremities normal, atraumatic, no cyanosis or edema Pulses: Right radial arterial access has healed well without any complications. Faint bilateral carotid bruit. Pedal pulses absent bilaterally. Prominent abdominal pulsation felt, nontender. Labs:   Lab Results  Component Value Date   WBC 5.4 01/20/2014   HGB 14.2 01/20/2014   HCT 42.8 01/20/2014   MCV 89.0 01/20/2014   PLT 181 01/20/2014    Recent Labs Lab 01/19/14 1455 01/20/14 0415  NA 140 142  K 3.9 3.6*  CL 105 103  CO2 21 21  BUN 11 11  CREATININE 0.95 0.95  CALCIUM 8.7 9.5  PROT 6.9  --   BILITOT 0.5  --   ALKPHOS 58  --   ALT 16  --   AST 17  --   GLUCOSE 164* 134*   No results found for this basename: CKTOTAL, CKMB, CKMBINDEX, TROPONINI    Lipid Panel  No results found for this basename: chol, trig, hdl, cholhdl, vldl, ldlcalc    EKG 01/20/2014: Atrial fibrillation with controlled ventricular response, LVH, diffuse nonspecific T.  Abnormality. PVC.    Radiology: Dg Chest 2 View  01/19/2014   CLINICAL DATA:  Preop evaluation for EVAR, history hypertension, asthma  EXAM: CHEST  2 VIEW  COMPARISON:  None  FINDINGS: Upper normal heart size.  Calcified mildly tortuous thoracic aorta.  Pulmonary vascularity normal.  Minimal bronchitic changes.  No pulmonary infiltrate, pleural effusion or pneumothorax.  Bones diffusely demineralized.  IMPRESSION: Minimal bronchitic changes.   Electronically Signed   By: Lavonia Dana M.D.   On: 01/19/2014 15:25   FOLLOW UP PLANS AND APPOINTMENTS    Medication List         atenolol 100 MG tablet  Commonly known as:  TENORMIN  Take 1 tablet (100 mg total) by mouth 2 (two) times daily.     cloNIDine 0.1 MG tablet  Commonly known as:  CATAPRES  Take 0.1 mg by mouth daily.     clopidogrel 75 MG tablet  Commonly known as:  PLAVIX  Take 1 tablet (75 mg total) by mouth daily with breakfast.     haloperidol 5 MG tablet  Commonly known as:  HALDOL  Take 2.5 mg by mouth daily.     hydrALAZINE 50 MG tablet  Commonly known as:  APRESOLINE  Take 1 tablet (50 mg total) by mouth once.     isosorbide mononitrate 60 MG 24 hr tablet  Commonly known as:  IMDUR  Take 1 tablet (60 mg total) by mouth daily.  SAVAYSA 60 MG Tabs tablet  Generic drug:  edoxaban  Take 60 mg by mouth daily.     simvastatin 40 MG tablet  Commonly known as:  ZOCOR  Take 40 mg by mouth daily.     trihexyphenidyl 2 MG tablet  Commonly known as:  ARTANE  Take 2 mg by mouth daily.           Follow-up Information   Follow up with Laverda Page, MD. (Keep previous appointment)    Specialty:  Cardiology   Contact information:   [place] [place] [address] [phone]        Laverda Page, MD 01/20/2014, 8:54 AM  Pager: [phone] Office: [phone] If no answer: [phone]

## 2014-01-20 NOTE — Discharge Instructions (Signed)
Do not drive for two days. Keep right hand straight as possible and not use frequently.  Stop Savaysa after the evening dose on Friday 01/29/2014 for Aortic procedure by Dr. Oneida Alar.

## 2014-01-20 NOTE — Progress Notes (Signed)
CARDIAC REHAB PHASE I   PRE:  Rate/Rhythm: 98 afib with PVC    BP: sitting 175/100    SaO2: 99 RA  MODE:  Ambulation: 200 ft   POST:  Rate/Rhythm: 120 afib    BP: sitting 195/115     SaO2: 100 RA  Pt steady, no c/o. BP very elevated therefore only walked short distance. Afib with slightly elevated rate. Son present and ed completed. Pt able to complete some teach back. Interested in [place] once other procedures are taken care of. Will send referral to [facility]   Darrick Meigs CES, ACSM 01/20/2014 9:13 AM

## 2014-01-21 ENCOUNTER — Ambulatory Visit (INDEPENDENT_AMBULATORY_CARE_PROVIDER_SITE_OTHER): Payer: Medicare Other | Admitting: Podiatry

## 2014-01-21 ENCOUNTER — Encounter: Payer: Self-pay | Admitting: Podiatry

## 2014-01-21 DIAGNOSIS — M79673 Pain in unspecified foot: Secondary | ICD-10-CM

## 2014-01-21 DIAGNOSIS — M79609 Pain in unspecified limb: Secondary | ICD-10-CM

## 2014-01-21 DIAGNOSIS — B351 Tinea unguium: Secondary | ICD-10-CM | POA: Diagnosis not present

## 2014-01-21 NOTE — Progress Notes (Signed)
Subjective:     Patient ID: Wesley [name], male   DOB: 1945/08/23, 68 y.o.   MRN: [id number]  HPI patient has thick yellow nailbeds 1-5 both feet that can become tender   Review of Systems     Objective:   Physical Exam Neurovascular status is intact with thick yellow brittle nailbeds 1-5 of both feet that are painful when pressed from a dorsal direction    Assessment:     Mycotic nail infection with pain 1-5 both feet    Plan:     Debris painful nailbeds 1-5 both feet with no iatrogenic bleeding noted

## 2014-01-27 ENCOUNTER — Encounter (HOSPITAL_COMMUNITY): Payer: Self-pay | Admitting: *Deleted

## 2014-01-27 DIAGNOSIS — I209 Angina pectoris, unspecified: Secondary | ICD-10-CM | POA: Diagnosis not present

## 2014-01-27 DIAGNOSIS — I714 Abdominal aortic aneurysm, without rupture, unspecified: Secondary | ICD-10-CM | POA: Diagnosis not present

## 2014-01-27 DIAGNOSIS — I2510 Atherosclerotic heart disease of native coronary artery without angina pectoris: Secondary | ICD-10-CM | POA: Diagnosis not present

## 2014-01-27 DIAGNOSIS — I4891 Unspecified atrial fibrillation: Secondary | ICD-10-CM | POA: Diagnosis not present

## 2014-01-27 NOTE — Progress Notes (Signed)
01/27/14 1037  OBSTRUCTIVE SLEEP APNEA  Have you ever been diagnosed with sleep apnea through a sleep study? No  Do you snore loudly (loud enough to be heard through closed doors)?  0  Do you often feel tired, fatigued, or sleepy during the daytime? 1  Has anyone observed you stop breathing during your sleep? 1  Do you have, or are you being treated for high blood pressure? 1  BMI more than 35 kg/m2? 0  Age over 70 years old? 1  Neck circumference greater than 40 cm/16 inches? 1  Gender: 1  Obstructive Sleep Apnea Score 6  Score 4 or greater  Results sent to PCP

## 2014-01-27 NOTE — Progress Notes (Signed)
Pt's son verified medical and surgical hx, meds and allergies for pre-op call. Pre-op instructions given to son, Othel Hoogendoorn.  Per Dr. Irven Shelling discharge note on 01/19/14, pt is not to take [facility] after 01/29/14 (last dose to be 01/29/14). Pt's son thought they told him last dose would be 01/28/14. Pt has an appt with Dr. Einar Gip this morning and he will clarify this with Dr. Einar Gip. His son will also clarify that pt continues his Plavix prior to surgery, but not take it day of surgery.   Pt did not keep Blood Bank bracelet on, will need to obtain another type and screen morning of surgery.

## 2014-01-28 NOTE — Progress Notes (Signed)
Discussed with Dr. Oneida Alar the pre-op instructions for holding Savaysa.  Recommended pt. to hold Savaysa 72 hrs. pre-op.  Notified pt's son, Jenny Reichmann to have pt. hold Savaysa starting tomorrow 7/3, and remain off of it until instructed to resume following surgery.  Son, Jenny Reichmann verbalized understanding.

## 2014-01-28 NOTE — Progress Notes (Signed)
Anesthesia Chart Review:  Patient is a 68 year old male scheduled for EVAR AAA on 02/01/14 by Dr. Oneida Alar. 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 PCP is Dr. Jeanie Cooks.   Cardiologist is Dr. Einar Gip who is aware of plans for surgery. He has instructed patient to "continue Savaysa for anticoagulation for atrial fibrillation which will be held for 48 hours prior to endovascular stent placement for AAA." Patient is staying on Plavix.  Dr. Oneida Alar is aware.  I spoke with Colletta Maryland, RN this morning who said they have already been in contact with patient today regarding clarification of perioperative anti-platelet therapy instructions.    He had an abnormal nuclear stress test on 10/24/13 showing a moderate sized severe ischemia in the mid inferior wall and inferolateral wall.  EF 61%. He subsequently had a cardiac cath on 01/19/14 showed:  Left ventricle: Performed in the RAO projection revealed LVEF of 65 %. There was no significant MR. no wall motion abnormality.  Right coronary artery: Dominant. If you very large caliber vessel. There is a high-grade 99% stenosis followed by a 60-70% stenosis in the distal RCA. Very large PL branch and a moderate size PDA. There is mild diffuse luminal irregularity in the proximal to mid segments. There were no collaterals to the right coronary artery from the contralateral side. S/P successful PTCA and Rebel Non-DES to distal RCA. Will need antiplatelet therapy with Plavix and will continue Savyaso for A. Fib. No ASA due to increased risk of bleed. Left main coronary artery is large and normal.  Circumflex coronary artery: A moderate to small sized vessel giving origin to a moderate sized obtuse marginal 1. Mild luminal noted  LAD: LAD gives origin to several small diagonals,  D3 his small to moderate. LAD has mild diffuse luminal irregularities.  Ramus intermediate: Very large vessel, giving origin to a large secondary branch, a second branch has multiple branches. Mild luminal irregularity evident.  Echo on 11/12/13 [facility] CV) showed: Normal LV cavity size. Mild concentric LVH. Calculated EF 62%. No LV wall motion abnormalities.  Mildly dilated LA/RA. Moderate MR. Moderate TR. Mild pulmonary hypertension with approximate PA systolic pressure 40 mmHg.   EKG on 01/27/14 [facility] CV) showed afib @ 85 bpm. 01/20/14 showed: afib @ 87 bpm, PVC or aberrantly conducted complexes, non-specific T wave abnormalilty.  DATA: 12/09/13 CT scan of abdomen and pelvis showed a 5.7 cm abdominal aortic aneurysm. The neck is 2.6 cm. The right common iliac artery is ectatic at 16 mm. The left external iliac artery is slightly narrowed. 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 12/09/13 CT Angio of the head and neck showed a 75% right internal carotid artery stenosis and an 85% left internal carotid artery stenosis   CXR on 01/19/14 showed: Minimal bronchitic changes.  Labs from 01/19/14 and 01/20/14 noted. He took his blood bracelet off, so T&S will need to be repeated on the day of surgery.  A repeat PTT was also ordered.   If no acute changes then I anticipate that he can proceed as planned.  I did discuss patient being on Artane for extrapyramidal symptoms with anesthesiologist Dr. Albertina Parr. He thought it may be best for patient to hold on the morning of surgery, but if patient felt it really helped with his symptoms then it should  be okay to take.  I spoke with patients' son. He is planning to hold it until he speaks with patient's assigned anesthesiologist on the day of surgery.    George Hugh [facility]/Anesthesiology Phone [phone] 01/28/2014 2:15 PM

## 2014-01-31 MED ORDER — DEXTROSE 5 % IV SOLN
1.5000 g | INTRAVENOUS | Status: AC
  Administered 2014-02-01: 1.5 g via INTRAVENOUS
  Filled 2014-01-31: qty 1.5

## 2014-02-01 ENCOUNTER — Inpatient Hospital Stay (HOSPITAL_COMMUNITY): Payer: Medicare Other | Admitting: Vascular Surgery

## 2014-02-01 ENCOUNTER — Inpatient Hospital Stay (HOSPITAL_COMMUNITY): Payer: Medicare Other

## 2014-02-01 ENCOUNTER — Encounter (HOSPITAL_COMMUNITY): Payer: Self-pay | Admitting: Certified Registered Nurse Anesthetist

## 2014-02-01 ENCOUNTER — Other Ambulatory Visit: Payer: Self-pay

## 2014-02-01 ENCOUNTER — Inpatient Hospital Stay (HOSPITAL_COMMUNITY)
Admission: RE | Admit: 2014-02-01 | Discharge: 2014-02-02 | DRG: 238 | Disposition: A | Discharge status: Home / Self Care | Payer: Medicare Other | Source: Ambulatory Visit | Attending: Vascular Surgery | Admitting: Vascular Surgery

## 2014-02-01 ENCOUNTER — Encounter (HOSPITAL_COMMUNITY)
Admission: RE | Disposition: A | Discharge status: Home / Self Care | Payer: Self-pay | Source: Ambulatory Visit | Attending: Vascular Surgery

## 2014-02-01 ENCOUNTER — Encounter (HOSPITAL_COMMUNITY): Payer: Medicare Other | Admitting: Vascular Surgery

## 2014-02-01 DIAGNOSIS — I714 Abdominal aortic aneurysm, without rupture, unspecified: Principal | ICD-10-CM | POA: Diagnosis present

## 2014-02-01 DIAGNOSIS — I10 Essential (primary) hypertension: Secondary | ICD-10-CM | POA: Diagnosis present

## 2014-02-01 DIAGNOSIS — Z9849 Cataract extraction status, unspecified eye: Secondary | ICD-10-CM | POA: Diagnosis not present

## 2014-02-01 DIAGNOSIS — Z8249 Family history of ischemic heart disease and other diseases of the circulatory system: Secondary | ICD-10-CM

## 2014-02-01 DIAGNOSIS — I6529 Occlusion and stenosis of unspecified carotid artery: Secondary | ICD-10-CM | POA: Diagnosis not present

## 2014-02-01 DIAGNOSIS — M549 Dorsalgia, unspecified: Secondary | ICD-10-CM | POA: Diagnosis not present

## 2014-02-01 DIAGNOSIS — Z87891 Personal history of nicotine dependence: Secondary | ICD-10-CM

## 2014-02-01 DIAGNOSIS — Z79899 Other long term (current) drug therapy: Secondary | ICD-10-CM | POA: Diagnosis not present

## 2014-02-01 DIAGNOSIS — F209 Schizophrenia, unspecified: Secondary | ICD-10-CM | POA: Diagnosis present

## 2014-02-01 DIAGNOSIS — I713 Abdominal aortic aneurysm, ruptured, unspecified: Secondary | ICD-10-CM | POA: Diagnosis not present

## 2014-02-01 DIAGNOSIS — I4891 Unspecified atrial fibrillation: Secondary | ICD-10-CM | POA: Diagnosis present

## 2014-02-01 DIAGNOSIS — R259 Unspecified abnormal involuntary movements: Secondary | ICD-10-CM | POA: Diagnosis present

## 2014-02-01 DIAGNOSIS — Z7902 Long term (current) use of antithrombotics/antiplatelets: Secondary | ICD-10-CM | POA: Diagnosis not present

## 2014-02-01 DIAGNOSIS — Z8673 Personal history of transient ischemic attack (TIA), and cerebral infarction without residual deficits: Secondary | ICD-10-CM

## 2014-02-01 DIAGNOSIS — G8929 Other chronic pain: Secondary | ICD-10-CM | POA: Diagnosis present

## 2014-02-01 DIAGNOSIS — Z9861 Coronary angioplasty status: Secondary | ICD-10-CM

## 2014-02-01 DIAGNOSIS — I658 Occlusion and stenosis of other precerebral arteries: Secondary | ICD-10-CM | POA: Diagnosis present

## 2014-02-01 DIAGNOSIS — Z48812 Encounter for surgical aftercare following surgery on the circulatory system: Secondary | ICD-10-CM

## 2014-02-01 DIAGNOSIS — I2510 Atherosclerotic heart disease of native coronary artery without angina pectoris: Secondary | ICD-10-CM | POA: Diagnosis present

## 2014-02-01 DIAGNOSIS — R918 Other nonspecific abnormal finding of lung field: Secondary | ICD-10-CM | POA: Diagnosis not present

## 2014-02-01 HISTORY — DX: Cardiac arrhythmia, unspecified: I49.9

## 2014-02-01 HISTORY — DX: Schizophrenia, unspecified: F20.9

## 2014-02-01 HISTORY — PX: ABDOMINAL AORTIC ENDOVASCULAR STENT GRAFT: SHX5707

## 2014-02-01 LAB — BASIC METABOLIC PANEL
Anion gap: 11 (ref 5–15)
BUN: 15 mg/dL (ref 6–23)
CO2: 24 mEq/L (ref 19–32)
CREATININE: 1.04 mg/dL (ref 0.50–1.35)
Calcium: 8.9 mg/dL (ref 8.4–10.5)
Chloride: 105 mEq/L (ref 96–112)
GFR calc Af Amer: 83 mL/min — ABNORMAL LOW (ref >90)
GFR, EST NON AFRICAN AMERICAN: 71 mL/min — AB (ref >90)
Glucose, Bld: 105 mg/dL — ABNORMAL HIGH (ref 70–99)
Potassium: 4.6 mEq/L (ref 3.7–5.3)
Sodium: 140 mEq/L (ref 137–147)

## 2014-02-01 LAB — APTT
APTT: 33 s (ref 24–37)
aPTT: 34 seconds (ref 24–37)

## 2014-02-01 LAB — CBC
HEMATOCRIT: 39.8 % (ref 39.0–52.0)
Hemoglobin: 13.1 g/dL (ref 13.0–17.0)
MCH: 29.6 pg (ref 26.0–34.0)
MCHC: 32.9 g/dL (ref 30.0–36.0)
MCV: 90 fL (ref 78.0–100.0)
Platelets: 212 10*3/uL (ref 150–400)
RBC: 4.42 MIL/uL (ref 4.22–5.81)
RDW: 14 % (ref 11.5–15.5)
WBC: 4.1 10*3/uL (ref 4.0–10.5)

## 2014-02-01 LAB — PROTIME-INR
INR: 1.27 (ref 0.00–1.49)
Prothrombin Time: 15.9 seconds — ABNORMAL HIGH (ref 11.6–15.2)

## 2014-02-01 LAB — TYPE AND SCREEN
ABO/RH(D): O POS
ANTIBODY SCREEN: NEGATIVE

## 2014-02-01 LAB — MAGNESIUM: Magnesium: 2 mg/dL (ref 1.5–2.5)

## 2014-02-01 SURGERY — INSERTION, ENDOVASCULAR STENT GRAFT, AORTA, ABDOMINAL
Anesthesia: General | Site: Abdomen

## 2014-02-01 MED ORDER — IODIXANOL 320 MG/ML IV SOLN
INTRAVENOUS | Status: DC | PRN
  Administered 2014-02-01: 70.6 mL via INTRA_ARTERIAL

## 2014-02-01 MED ORDER — ROCURONIUM BROMIDE 100 MG/10ML IV SOLN
INTRAVENOUS | Status: DC | PRN
  Administered 2014-02-01: 10 mg via INTRAVENOUS
  Administered 2014-02-01: 50 mg via INTRAVENOUS

## 2014-02-01 MED ORDER — ONDANSETRON HCL 4 MG/2ML IJ SOLN
INTRAMUSCULAR | Status: DC | PRN
  Administered 2014-02-01: 4 mg via INTRAVENOUS

## 2014-02-01 MED ORDER — DOPAMINE-DEXTROSE 3.2-5 MG/ML-% IV SOLN: 3.0000 ug/kg/min | INTRAVENOUS | Status: DC

## 2014-02-01 MED ORDER — ATENOLOL 50 MG PO TABS
100.0000 mg | ORAL_TABLET | Freq: Two times a day (BID) | ORAL | Status: DC
  Administered 2014-02-01 – 2014-02-02 (×3): 100 mg via ORAL
  Filled 2014-02-01: qty 1
  Filled 2014-02-01: qty 2
  Filled 2014-02-01: qty 1
  Filled 2014-02-01: qty 2
  Filled 2014-02-01: qty 1

## 2014-02-01 MED ORDER — PROPOFOL 10 MG/ML IV BOLUS
INTRAVENOUS | Status: DC | PRN
  Administered 2014-02-01: 150 mg via INTRAVENOUS

## 2014-02-01 MED ORDER — DEXTROSE 5 % IV SOLN
10.0000 mg | INTRAVENOUS | Status: DC | PRN
  Administered 2014-02-01: 15 ug/min via INTRAVENOUS

## 2014-02-01 MED ORDER — SODIUM CHLORIDE 0.9 % IV SOLN: 500.0000 mL | Freq: Once | INTRAVENOUS | Status: AC | PRN

## 2014-02-01 MED ORDER — ONDANSETRON HCL 4 MG/2ML IJ SOLN
INTRAMUSCULAR | Status: AC
  Filled 2014-02-01: qty 2

## 2014-02-01 MED ORDER — HYDROMORPHONE HCL PF 1 MG/ML IJ SOLN
INTRAMUSCULAR | Status: AC
  Filled 2014-02-01: qty 1

## 2014-02-01 MED ORDER — HYDRALAZINE HCL 20 MG/ML IJ SOLN
10.0000 mg | INTRAMUSCULAR | Status: DC | PRN
  Administered 2014-02-01: 10 mg via INTRAVENOUS
  Filled 2014-02-01: qty 0.5

## 2014-02-01 MED ORDER — GLYCOPYRROLATE 0.2 MG/ML IJ SOLN
INTRAMUSCULAR | Status: DC | PRN
  Administered 2014-02-01: .8 mg via INTRAVENOUS

## 2014-02-01 MED ORDER — HEPARIN SODIUM (PORCINE) 1000 UNIT/ML IJ SOLN
INTRAMUSCULAR | Status: DC | PRN
  Administered 2014-02-01: 10000 [IU] via INTRAVENOUS

## 2014-02-01 MED ORDER — MIDAZOLAM HCL 2 MG/2ML IJ SOLN
INTRAMUSCULAR | Status: AC
  Filled 2014-02-01: qty 2

## 2014-02-01 MED ORDER — LACTATED RINGERS IV SOLN
INTRAVENOUS | Status: DC | PRN
  Administered 2014-02-01: 08:00:00 via INTRAVENOUS

## 2014-02-01 MED ORDER — FENTANYL CITRATE 0.05 MG/ML IJ SOLN
INTRAMUSCULAR | Status: DC | PRN
  Administered 2014-02-01 (×2): 50 ug via INTRAVENOUS
  Administered 2014-02-01: 100 ug via INTRAVENOUS

## 2014-02-01 MED ORDER — OXYCODONE HCL 5 MG PO TABS: 5.0000 mg | ORAL_TABLET | Freq: Once | ORAL | Status: DC | PRN

## 2014-02-01 MED ORDER — METOPROLOL TARTRATE 1 MG/ML IV SOLN: 2.0000 mg | INTRAVENOUS | Status: DC | PRN

## 2014-02-01 MED ORDER — PANTOPRAZOLE SODIUM 40 MG PO TBEC
40.0000 mg | DELAYED_RELEASE_TABLET | Freq: Every day | ORAL | Status: DC
  Administered 2014-02-01 – 2014-02-02 (×2): 40 mg via ORAL
  Filled 2014-02-01 (×2): qty 1

## 2014-02-01 MED ORDER — HYDRALAZINE HCL 50 MG PO TABS
50.0000 mg | ORAL_TABLET | Freq: Every day | ORAL | Status: DC
  Administered 2014-02-02: 50 mg via ORAL
  Filled 2014-02-01: qty 1

## 2014-02-01 MED ORDER — ACETAMINOPHEN 325 MG PO TABS: 325.0000 mg | ORAL_TABLET | ORAL | Status: DC | PRN

## 2014-02-01 MED ORDER — OXYCODONE HCL 5 MG PO TABS
5.0000 mg | ORAL_TABLET | ORAL | Status: DC | PRN
  Administered 2014-02-01: 10 mg via ORAL

## 2014-02-01 MED ORDER — OXYCODONE HCL 5 MG/5ML PO SOLN: 5.0000 mg | Freq: Once | ORAL | Status: DC | PRN

## 2014-02-01 MED ORDER — ISOSORBIDE MONONITRATE ER 60 MG PO TB24
60.0000 mg | ORAL_TABLET | Freq: Every day | ORAL | Status: DC
  Administered 2014-02-01: 60 mg via ORAL
  Filled 2014-02-01 (×2): qty 1

## 2014-02-01 MED ORDER — HALOPERIDOL 2 MG PO TABS
2.5000 mg | ORAL_TABLET | Freq: Every day | ORAL | Status: DC
  Administered 2014-02-01: 2.5 mg via ORAL
  Filled 2014-02-01 (×2): qty 1

## 2014-02-01 MED ORDER — MIDAZOLAM HCL 5 MG/5ML IJ SOLN
INTRAMUSCULAR | Status: DC | PRN
  Administered 2014-02-01: 1 mg via INTRAVENOUS

## 2014-02-01 MED ORDER — LIDOCAINE HCL (CARDIAC) 20 MG/ML IV SOLN
INTRAVENOUS | Status: DC | PRN
  Administered 2014-02-01: 60 mg via INTRAVENOUS

## 2014-02-01 MED ORDER — DEXTROSE 5 % IV SOLN
1.5000 g | Freq: Two times a day (BID) | INTRAVENOUS | Status: AC
  Administered 2014-02-01 – 2014-02-02 (×2): 1.5 g via INTRAVENOUS
  Filled 2014-02-01 (×2): qty 1.5

## 2014-02-01 MED ORDER — 0.9 % SODIUM CHLORIDE (POUR BTL) OPTIME
TOPICAL | Status: DC | PRN
  Administered 2014-02-01: 1000 mL

## 2014-02-01 MED ORDER — TRIHEXYPHENIDYL HCL 2 MG PO TABS
2.0000 mg | ORAL_TABLET | Freq: Every day | ORAL | Status: DC
  Administered 2014-02-01 – 2014-02-02 (×2): 2 mg via ORAL
  Filled 2014-02-01 (×2): qty 1

## 2014-02-01 MED ORDER — ALUM & MAG HYDROXIDE-SIMETH 200-200-20 MG/5ML PO SUSP: 15.0000 mL | ORAL | Status: DC | PRN

## 2014-02-01 MED ORDER — CHLORHEXIDINE GLUCONATE CLOTH 2 % EX PADS: 6.0000 | MEDICATED_PAD | Freq: Once | CUTANEOUS | Status: DC

## 2014-02-01 MED ORDER — FENTANYL CITRATE 0.05 MG/ML IJ SOLN
INTRAMUSCULAR | Status: AC
  Filled 2014-02-01: qty 5

## 2014-02-01 MED ORDER — POTASSIUM CHLORIDE CRYS ER 20 MEQ PO TBCR: 20.0000 meq | EXTENDED_RELEASE_TABLET | Freq: Every day | ORAL | Status: DC | PRN

## 2014-02-01 MED ORDER — LABETALOL HCL 5 MG/ML IV SOLN
10.0000 mg | INTRAVENOUS | Status: DC | PRN
  Administered 2014-02-01: 10 mg via INTRAVENOUS

## 2014-02-01 MED ORDER — OXYCODONE HCL 5 MG/5ML PO SOLN
ORAL | Status: AC
  Filled 2014-02-01: qty 10

## 2014-02-01 MED ORDER — PROTAMINE SULFATE 10 MG/ML IV SOLN
INTRAVENOUS | Status: DC | PRN
  Administered 2014-02-01: 10 mg via INTRAVENOUS
  Administered 2014-02-01: 90 mg via INTRAVENOUS

## 2014-02-01 MED ORDER — SODIUM CHLORIDE 0.9 % IV SOLN
INTRAVENOUS | Status: DC
  Administered 2014-02-01: 18:00:00 via INTRAVENOUS

## 2014-02-01 MED ORDER — PROPOFOL 10 MG/ML IV BOLUS
INTRAVENOUS | Status: AC
  Filled 2014-02-01: qty 20

## 2014-02-01 MED ORDER — LIDOCAINE HCL (CARDIAC) 20 MG/ML IV SOLN
INTRAVENOUS | Status: AC
  Filled 2014-02-01: qty 5

## 2014-02-01 MED ORDER — CLONIDINE HCL 0.1 MG PO TABS
0.1000 mg | ORAL_TABLET | Freq: Every day | ORAL | Status: DC
  Administered 2014-02-01 – 2014-02-02 (×2): 0.1 mg via ORAL
  Filled 2014-02-01 (×2): qty 1

## 2014-02-01 MED ORDER — LABETALOL HCL 5 MG/ML IV SOLN
INTRAVENOUS | Status: AC
  Filled 2014-02-01: qty 4

## 2014-02-01 MED ORDER — PHENOL 1.4 % MT LIQD: 1.0000 | OROMUCOSAL | Status: DC | PRN

## 2014-02-01 MED ORDER — GUAIFENESIN-DM 100-10 MG/5ML PO SYRP: 15.0000 mL | ORAL_SOLUTION | ORAL | Status: DC | PRN

## 2014-02-01 MED ORDER — ONDANSETRON HCL 4 MG/2ML IJ SOLN: 4.0000 mg | Freq: Four times a day (QID) | INTRAMUSCULAR | Status: DC | PRN

## 2014-02-01 MED ORDER — MORPHINE SULFATE 2 MG/ML IJ SOLN: 2.0000 mg | INTRAMUSCULAR | Status: DC | PRN

## 2014-02-01 MED ORDER — LACTATED RINGERS IV SOLN: INTRAVENOUS | Status: DC

## 2014-02-01 MED ORDER — LACTATED RINGERS IV SOLN
INTRAVENOUS | Status: DC | PRN
  Administered 2014-02-01: 09:00:00 via INTRAVENOUS

## 2014-02-01 MED ORDER — EDOXABAN TOSYLATE 60 MG PO TABS
60.0000 mg | ORAL_TABLET | Freq: Every day | ORAL | Status: DC
  Administered 2014-02-02: 60 mg via ORAL
  Filled 2014-02-01: qty 60

## 2014-02-01 MED ORDER — DOCUSATE SODIUM 100 MG PO CAPS
100.0000 mg | ORAL_CAPSULE | Freq: Every day | ORAL | Status: DC
  Administered 2014-02-02: 100 mg via ORAL
  Filled 2014-02-01: qty 1

## 2014-02-01 MED ORDER — SIMVASTATIN 40 MG PO TABS
40.0000 mg | ORAL_TABLET | Freq: Every day | ORAL | Status: DC
  Administered 2014-02-01: 40 mg via ORAL
  Filled 2014-02-01 (×2): qty 1

## 2014-02-01 MED ORDER — HYDROMORPHONE HCL PF 1 MG/ML IJ SOLN
0.2500 mg | INTRAMUSCULAR | Status: DC | PRN
  Administered 2014-02-01: 1 mg via INTRAVENOUS

## 2014-02-01 MED ORDER — CLOPIDOGREL BISULFATE 75 MG PO TABS
75.0000 mg | ORAL_TABLET | Freq: Every day | ORAL | Status: DC
  Administered 2014-02-02: 75 mg via ORAL
  Filled 2014-02-01 (×2): qty 1

## 2014-02-01 MED ORDER — ROCURONIUM BROMIDE 50 MG/5ML IV SOLN
INTRAVENOUS | Status: AC
  Filled 2014-02-01: qty 1

## 2014-02-01 MED ORDER — METOCLOPRAMIDE HCL 5 MG/ML IJ SOLN: 10.0000 mg | Freq: Once | INTRAMUSCULAR | Status: DC | PRN

## 2014-02-01 MED ORDER — ACETAMINOPHEN 650 MG RE SUPP: 325.0000 mg | RECTAL | Status: DC | PRN

## 2014-02-01 MED ORDER — SODIUM CHLORIDE 0.9 % IR SOLN
Status: DC | PRN
  Administered 2014-02-01: 10:00:00

## 2014-02-01 MED ORDER — NEOSTIGMINE METHYLSULFATE 10 MG/10ML IV SOLN
INTRAVENOUS | Status: DC | PRN
  Administered 2014-02-01: 5 mg via INTRAVENOUS

## 2014-02-01 SURGICAL SUPPLY — 78 items
BAG BANDED W/RUBBER/TAPE 36X54 (MISCELLANEOUS) ×2 IMPLANT
BAG SNAP BAND KOVER 36X36 (MISCELLANEOUS) ×2 IMPLANT
BLADE 10 SAFETY STRL DISP (BLADE) ×2 IMPLANT
CANISTER SUCTION 2500CC (MISCELLANEOUS) ×2 IMPLANT
CATH BEACON 5.038 65CM KMP-01 (CATHETERS) ×2 IMPLANT
CATH OMNI FLUSH .035X70CM (CATHETERS) ×2 IMPLANT
CLIP TI MEDIUM 24 (CLIP) IMPLANT
CLIP TI WIDE RED SMALL 24 (CLIP) IMPLANT
COVER DOME SNAP 22 D (MISCELLANEOUS) ×2 IMPLANT
COVER MAYO STAND STRL (DRAPES) ×2 IMPLANT
COVER PROBE W GEL 5X96 (DRAPES) ×2 IMPLANT
COVER SURGICAL LIGHT HANDLE (MISCELLANEOUS) ×2 IMPLANT
DERMABOND ADVANCED (GAUZE/BANDAGES/DRESSINGS) ×2
DERMABOND ADVANCED .7 DNX12 (GAUZE/BANDAGES/DRESSINGS) ×2 IMPLANT
DEVICE CLOSURE PERCLS PRGLD 6F (VASCULAR PRODUCTS) ×6 IMPLANT
DRAPE TABLE COVER HEAVY DUTY (DRAPES) ×2 IMPLANT
DRESSING OPSITE X SMALL 2X3 (GAUZE/BANDAGES/DRESSINGS) ×4 IMPLANT
DRSG TEGADERM 2-3/8X2-3/4 SM (GAUZE/BANDAGES/DRESSINGS) ×4 IMPLANT
DRYSEAL FLEXSHEATH 12FR 33CM (SHEATH) ×1
DRYSEAL FLEXSHEATH 18FR 33CM (SHEATH) ×1
ELECT CAUTERY BLADE 6.4 (BLADE) ×2 IMPLANT
ELECT REM PT RETURN 9FT ADLT (ELECTROSURGICAL) ×4
ELECTRODE REM PT RTRN 9FT ADLT (ELECTROSURGICAL) ×2 IMPLANT
EXCLUDER TNK 28X14.5MMX12CM (Endovascular Graft) ×1 IMPLANT
EXCLUDER TRUNK 28X14.5MMX12CM (Endovascular Graft) ×2 IMPLANT
GAUZE SPONGE 2X2 8PLY STRL LF (GAUZE/BANDAGES/DRESSINGS) ×2 IMPLANT
GAUZE SPONGE 4X4 16PLY XRAY LF (GAUZE/BANDAGES/DRESSINGS) ×2 IMPLANT
GLOVE BIO SURGEON STRL SZ 6.5 (GLOVE) ×4 IMPLANT
GLOVE BIO SURGEON STRL SZ7.5 (GLOVE) ×2 IMPLANT
GLOVE BIOGEL PI IND STRL 6.5 (GLOVE) ×1 IMPLANT
GLOVE BIOGEL PI IND STRL 7.0 (GLOVE) ×1 IMPLANT
GLOVE BIOGEL PI INDICATOR 6.5 (GLOVE) ×1
GLOVE BIOGEL PI INDICATOR 7.0 (GLOVE) ×1
GLOVE ECLIPSE 6.5 STRL STRAW (GLOVE) ×2 IMPLANT
GLOVE SURG SS PI 6.5 STRL IVOR (GLOVE) ×2 IMPLANT
GOWN STRL REUS W/ TWL LRG LVL3 (GOWN DISPOSABLE) ×4 IMPLANT
GOWN STRL REUS W/TWL LRG LVL3 (GOWN DISPOSABLE) ×4
GRAFT BALLN CATH 65CM (STENTS) ×1 IMPLANT
GRAFT EXCLUDER LEG 14.5X14 (Endovascular Graft) ×2 IMPLANT
KIT BASIN OR (CUSTOM PROCEDURE TRAY) ×2 IMPLANT
KIT ROOM TURNOVER OR (KITS) ×2 IMPLANT
LEG CONTRALATERAL 16X20X9.5 (Endovascular Graft) ×1 IMPLANT
NEEDLE PERC 18GX7CM (NEEDLE) ×2 IMPLANT
NS IRRIG 1000ML POUR BTL (IV SOLUTION) ×2 IMPLANT
PACK AORTA (CUSTOM PROCEDURE TRAY) ×2 IMPLANT
PAD ARMBOARD 7.5X6 YLW CONV (MISCELLANEOUS) ×4 IMPLANT
PENCIL BUTTON HOLSTER BLD 10FT (ELECTRODE) IMPLANT
PERCLOSE PROGLIDE 6F (VASCULAR PRODUCTS) ×12
PROBE PENCIL 8 MHZ STRL DISP (MISCELLANEOUS) ×2 IMPLANT
PROTECTION STATION PRESSURIZED (MISCELLANEOUS) ×2
SHEATH AVANTI 11CM 8FR (MISCELLANEOUS) ×2 IMPLANT
SHEATH BRITE TIP 8FR 23CM (MISCELLANEOUS) ×2 IMPLANT
SHEATH DRYSEAL FLEX 12FR 33CM (SHEATH) ×1 IMPLANT
SHEATH DRYSEAL FLEX 18FR 33CM (SHEATH) ×1 IMPLANT
SPONGE GAUZE 2X2 STER 10/PKG (GAUZE/BANDAGES/DRESSINGS) ×2
SPONGE SURGIFOAM ABS GEL 100 (HEMOSTASIS) IMPLANT
STAPLER VISISTAT 35W (STAPLE) IMPLANT
STATION PROTECTION PRESSURIZED (MISCELLANEOUS) ×1 IMPLANT
STENT GRAFT BALLN CATH 65CM (STENTS) ×1
STENT GRAFT CONTRALAT 16X20X9. (Endovascular Graft) ×1 IMPLANT
STOPCOCK MORSE 400PSI 3WAY (MISCELLANEOUS) ×2 IMPLANT
SUT PROLENE 5 0 C 1 24 (SUTURE) IMPLANT
SUT VIC AB 2-0 CT1 27 (SUTURE)
SUT VIC AB 2-0 CT1 TAPERPNT 27 (SUTURE) IMPLANT
SUT VIC AB 3-0 SH 27 (SUTURE)
SUT VIC AB 3-0 SH 27X BRD (SUTURE) IMPLANT
SUT VICRYL 4-0 PS2 18IN ABS (SUTURE) ×4 IMPLANT
SYR 20CC LL (SYRINGE) ×4 IMPLANT
SYR 30ML LL (SYRINGE) IMPLANT
SYR 5ML LL (SYRINGE) ×2 IMPLANT
SYR MEDRAD MARK V 150ML (SYRINGE) ×2 IMPLANT
SYRINGE 10CC LL (SYRINGE) ×6 IMPLANT
TOWEL OR 17X24 6PK STRL BLUE (TOWEL DISPOSABLE) ×4 IMPLANT
TOWEL OR 17X26 10 PK STRL BLUE (TOWEL DISPOSABLE) ×4 IMPLANT
TRAY FOLEY CATH 16FRSI W/METER (SET/KITS/TRAYS/PACK) ×2 IMPLANT
TUBING HIGH PRESSURE 120CM (CONNECTOR) ×2 IMPLANT
WIRE AMPLATZ SS-J .035X180CM (WIRE) ×4 IMPLANT
WIRE BENTSON .035X145CM (WIRE) ×4 IMPLANT

## 2014-02-01 NOTE — Transfer of Care (Signed)
Immediate Anesthesia Transfer of Care Note  Patient: Wesley [name]  Procedure(s) Performed: Procedure(s): ABDOMINAL AORTIC ENDOVASCULAR STENT GRAFT (N/A)  Patient Location: PACU  Anesthesia Type:General  Level of Consciousness: awake, alert  and oriented  Airway & Oxygen Therapy: Patient Spontanous Breathing  Post-op Assessment: Report given to PACU RN  Post vital signs: Reviewed and stable  Complications: No apparent anesthesia complications

## 2014-02-01 NOTE — Consult Note (Signed)
[facility] [place] HISTORY AND PHYSICAL   Referring: Clayton Lefort, M.D. History of Present Illness:  Patient is a 68 y.o. year old male who presents for evaluation of asymptomatic high-grade carotid stenosis and asymptomatic infrarenal abdominal aortic aneurysm. He denies abdominal or back pain. He denies recent symptoms of TIA amaurosis or stroke. He is a former smoker quit approximately 2 years ago to The patient lives with his son that overall performance his daily activities independently. He does have a history of abdominal aortic aneurysm in his sister. Other medical problems include psychiatric history with possible history of schizophrenia, "heat stroke" in 1987 details of this event are unavailable, chronic right upper extremity tremor and weakness since his "heat stroke"    Past Medical History   Diagnosis  Date   .  AAA (abdominal aortic aneurysm)     .  Carotid artery occlusion     .  Stroke  04/16/1986       Past Surgical History   Procedure  Laterality  Date   .  Nerve block limb  Right       Social History History   Substance Use Topics   .  Smoking status:  Former Smoker -- 1.00 packs/day for 58 years       Types:  Cigarettes       Quit date:  12/29/2011   .  Smokeless tobacco:  Not on file   .  Alcohol Use:  No     Family History Family History   Problem  Relation  Age of Onset   .  Cancer  Father     .  Hyperlipidemia  Father     .  Hypertension  Father     .  Heart disease  Sister     .  AAA (abdominal aortic aneurysm)  Sister       Allergies  No Known Allergies     No current facility-administered medications on file prior to encounter.   Current Outpatient Prescriptions on File Prior to Encounter  Medication Sig Dispense Refill  . atenolol (TENORMIN) 100 MG tablet Take 1 tablet (100 mg total) by mouth 2 (two) times daily.  180 tablet  1  . cloNIDine (CATAPRES) 0.1 MG tablet Take 0.1 mg by mouth daily.       . clopidogrel  (PLAVIX) 75 MG tablet Take 1 tablet (75 mg total) by mouth daily with breakfast.  30 tablet  6  . Edoxaban Tosylate (SAVAYSA) 60 MG TABS Take 60 mg by mouth daily.      . haloperidol (HALDOL) 5 MG tablet Take 2.5 mg by mouth at bedtime.       . hydrALAZINE (APRESOLINE) 50 MG tablet Take 1 tablet (50 mg total) by mouth once.  90 tablet  3  . simvastatin (ZOCOR) 40 MG tablet Take 40 mg by mouth at bedtime.       . trihexyphenidyl (ARTANE) 2 MG tablet Take 2 mg by mouth daily.         ROS:    General:  No weight loss, Fever, chills  HEENT: No recent headaches, no nasal bleeding, no visual changes, no sore throat  Neurologic: No dizziness, blackouts, seizures. No recent symptoms of stroke or mini- stroke. No recent episodes of slurred speech, or temporary blindness.  Cardiac: No recent episodes of chest pain/pressure, no shortness of breath at rest.  + shortness of breath with exertion.  Denies history of atrial fibrillation or irregular heartbeat  Vascular: No history of rest pain in feet.  No history of claudication.  No history of non-healing ulcer, No history of DVT    Pulmonary: No home oxygen, no productive cough, no hemoptysis,  No asthma or wheezing  Musculoskeletal:  [ ]  Arthritis, [ ]  Low back pain,  [ ]  Joint pain  Hematologic:No history of hypercoagulable state.  No history of easy bleeding.  No history of anemia  Gastrointestinal: No hematochezia or melena,  No gastroesophageal reflux, no trouble swallowing  Urinary: [ ]  chronic Kidney disease, [ ]  on HD - [ ]  MWF or [ ]  TTHS, [ ]  Burning with urination, [ ]  Frequent urination, [ ]  Difficulty urinating;    Skin: No rashes  Psychological: No history of anxiety,  No history of depression   Physical Examination     Filed Vitals:   02/01/14 0759 02/01/14 0805 02/01/14 0825  BP: 141/101  152/91  Pulse: 86    Temp: 97.9 F (36.6 C)    TempSrc: Oral    Resp: 16    Height: 6\' 1"  (1.854 m)    Weight:  193 lb 5.5 oz  (87.7 kg)   SpO2: 100%     General:  Alert and oriented, no acute distress HEENT: Normal Neck: No bruit or JVD Pulmonary: Clear to auscultation bilaterally Cardiac: Regular Rate and Rhythm without murmur Abdomen: Soft, non-tender, non-distended, no mass Skin: No rash Extremity Pulses:  2+ radial, brachial, femoral, dorsalis pedis, posterior tibial pulses bilaterally Musculoskeletal: No deformity or edema     Neurologic: Upper and lower extremity motor 5/5 and symmetric, tremor right hand  DATA:  CT scan of abdomen and pelvis is reviewed. This shows a 5.7 cm abdominal aortic aneurysm. The neck is 2.6 cm. The right common iliac artery is ectatic at 16 mm. The left external iliac artery is slightly narrowed. 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 CT Angio of the head and neck is also reviewed. This shows a 75% right internal carotid artery stenosis and an 85% left internal carotid artery stenosis   ASSESSMENT:  #1 asymptomatic 5.7 cm abdominal aortic aneurysm #2 bilateral high grade internal carotid artery stenosis, asymptomatic   PLAN:  On preliminary review the CT images the patient appears to be a percutaneous aneurysm stent graft candidate. We will schedule him for cardiac risk stratification by Dr. Clayton Lefort. After his aneurysm has been addressed we will consider whether or not to do elective carotid endarterectomy versus carotid stenting. I discussed all the image findings as well as the pathophysiology of carotid stenosis an abdominal aortic aneurysm with the patient and his son today. He understand agree to proceed aneurysm repair all potential by addressing the carotid artery stenosis.  His sayvasa was stopped 3 days ago and will need to be restarted postoperatively.  He remains on Plavix.  Ruta Hinds, MD [facility] [place] Office: [phone] Pager: [phone]

## 2014-02-01 NOTE — Op Note (Signed)
Procedure: Gore Excluder Stent graft repair of Infrarenal Aortic Aneurysm  Preop Diagnosis: AAA Post op Diagnosis: AAA  Asst: Samantha Rhyne PA-C Operative findings:   #1 Bilateral Proglide closure   #2 28x14x12 cm main body Gore Excluder device delivered via a right femoral system   #3 14 x 14 cm left iliac limb contralateral    #4 20 x 9.5 ipsilateral iliac extension right   PROCEDURE DETAIL: After obtaining informed consent the patient was taken to the operating room. The patient was placed in supine position the operating room table. After induction of general anesthesia and endotracheal intubation a Foley catheter was placed. Next the patient was prepped and draped in usual sterile fashion from the nipples down to the knees. Ultrasound was used to identify the right common femoral artery as well as the femoral bifurcation. An 11 blade was used to make a small neck in the skin over the level of the right common femoral artery. An introducer needle was then used to cannulate the right common femoral artery without difficulty. A 0.035 Bentson wire was then threaded up into the abdominal aorta through the right femoral system. A short 9 French dilator was placed over the guidewire the right femoral system. This was used to dilate the tract. The dilator was then removed and a Proglide device inserted over the guidewire into the right femoral system and this was deployed at the 2:00 position. The Proglide device was then removed and an additional Proglide device was brought in operative field and deployed at the 10:00 position. The sutures were kept separate and tagged with suture tags. Next the short 9 French sheath was then placed back over the guidewire into the right femoral system and the dilator removed and the sheath thoroughly flushed with heparinized saline. Attention was then turned to the left groin. Again using ultrasound the left common femoral artery was identified. The femoral  bifurcation was also identified. A small nick was made in the skin with the 11 blade. A hemostat was used to dilate a tract down to the artery. An introducer needle was then used to cannulate the left common femoral artery without difficulty. A 0.035 Bentson wire was then threaded up into the abdominal aorta under fluoroscopic guidance. A 9 French dilator was then placed over the wire to dilate the tract. Two Proglide devices were again brought on operative field and these were fired sequentially in the 10:00 position followed by an additional Proglide at the 2:00 position. The Proglide delivery systems were removed and the long 9 French sheath placed back over the guidewire up to the level of the iliac of the aortic bifurcation.  At this point, a 5 [place] Omni flush catheter was placed over the guidewire and the left groin up and the abdominal aorta. An abdominal aortogram was obtained in the AP position to determine level of the left and right renal arteries. At this point a 28 x 14 x 12 Gore Excluder main body device was selected. The Bentson wire from the right groin was advanced up into the descending thoracic aorta over a Kumpe catheter and the Bentsen wire replaced with an 035 Amplatz wire. An 18 Fr sheath was placed over the Amplatz wire to the mid infrarenal aorta.  The main body device was then placed over the Amplatz wire in the right groin and advanced up to the level of the renal arteries. Magnified views of the renal arteries were performed to make sure that we were not covering these.  The top portion of the stent graft was then deployed with the end of the stent just below the level of the right renal artery and this came over to just below the level of the left renal artery. The main body was delivered all the way down to the contralateral gate. Attention was then turned to the left groin. The Omni flush catheter was pulled down over a guidewire and removed and the Bentson wire placed in position  to cannulate the contralateral gate. The Kumpe catheter was placed back over the guidewire in the left femoral system. A 5 [place] Kumpe catheter was placed over this and this was used to selectively catheterize the contralateral gate and the guidewire was then advanced into the descending thoracic aorta. The main body portion of the gate was confirmed by twirling the pigtail catheter. The pigtail  catheter was then placed in a location so that we could use its markers to determine the exact length to the iliac bifurcation. An Amplatz wire was placed back through the pigtail catheter. A retrograde contrast angiogram was performed to determine the level of the left internal iliac artery and a 14 x 14 cm iliac limb was selected. The pigtail catheter was removed over the guidewire as well as the 9 Fr sheath and a 12 French sheath placed over the wire into the gate.  A  14 x 14 cm limb advanced so there was full coverage of the long marker on the contralateral limb. This was then deployed in the usual fashion down to the iliac bifurcation. The delivery system was then removed. The remainder of the ipsilateral iliac limb was also deployed.  A retrograde contrast angiogram was also performed to make sure that the right iliac limb did not cover the right internal iliac artery.  Measurement was made of the right iliac bifurcation and a 22 x 9.5 cm ipsilateral limb was placed.  Attention was then turned back to the left iliac system and a Gore aortic balloon placed over the wire up to the level of the proximal end of the stent and this was ballooned to profile. The limb attachment site was also ballooned as well as the distal attachment site. Attention was then turned to the right groin and the balloon was advanced over the guidewire on the right side and the distal attachment site as well as the midportion of iliac limb were also ballooned. The 5 [place] Omni flush catheter was then placed back to the guidewire on the right  side and a completion arteriogram was obtained. This showed no evidence of proximal or distal type I endoleak. There was no type II endoleak. There is no filling of the aneurysm sac. There were patent internal external iliac and renal arteries bilaterally with no endoleak.    At this point the Omni flush catheter was removed over a guidewire. All delivery devices were removed. We then proceeded to remove the large 18 French sheath from the right side with the guidewire in place. With pressure held above and below the insertion site the lateral Proglide closure was secured down.  The lateral proglide was also secured over the wire. There was good hemostasis. The guidewire was removed from the right side. Attention was then turned to the left groin. In similar fashion the 12 French sheath was removed and the guidewire left in place.The lateral Proglide was secured to obtain hemostasis.  There was good hemostasis and the Bentsen wire was removed from the left groin. Both feet had  doppler signals.  The patient had been given 10000 units of heparin before introducing the main body device. This was fully reversed with 100 mg of protamine at the end of the case. Each groin puncture site was then closed with a running 4-0 Vicryl subcuticular stitch.  The patient tolerated procedure well and there were no complications. Instrument sponge and needle counts correct in the case. Patient was awakened in the operating room extubated and taken to the recovery room in stable condition.  Ruta Hinds, MD [facility] [place] Office: [phone] Pager: [phone]

## 2014-02-01 NOTE — Progress Notes (Signed)
Pt with non focal neuro exam.  Moves all extremities follows commands.  He had some memory deficits month year president immediate post procedure but this seems to be resolving.  Sheath is now out of groin.  To 3 S soon.  Ruta Hinds, MD [facility] [place] Office: [phone] Pager: [phone]

## 2014-02-01 NOTE — Progress Notes (Signed)
  [facility] Day of Surgery Note  Subjective:  No complaints  Filed Vitals:   02/01/14 1400  BP:   Pulse: 125  Temp: 97.4 F (36.3 C)  Resp: 18    Incisions:  Bilateral groins are soft without hematoma Extremities:  Bilateral feet warm with palpable DP bilaterally Lungs:  Non labored Abdomen: Soft, NT,ND   Assessment/Plan:  This is a 68 y.o. male who is s/p Gore Excluder Stent graft repair of Infrarenal Aortic Aneurysm   -pt doing well  -HR/BP increased this afternoon-will give pt's home BP medications and continue PRN meds -palpable DP bilaterally -will transfer to 3S this afternoon.   Leontine Locket, PA-C 02/01/2014 2:24 PM

## 2014-02-01 NOTE — Anesthesia Postprocedure Evaluation (Signed)
Anesthesia Post Note  Patient: Wesley [name]  Procedure(s) Performed: Procedure(s) (LRB): ABDOMINAL AORTIC ENDOVASCULAR STENT GRAFT (N/A)  Anesthesia type: General  Patient location: PACU  Post pain: Pain level controlled  Post assessment: Patient's Cardiovascular Status Stable  Last Vitals:  Filed Vitals:   02/01/14 1326  BP: 149/103  Pulse:   Temp: 36.4 C  Resp:     Post vital signs: Reviewed and stable  Level of consciousness: alert  Complications: No apparent anesthesia complications

## 2014-02-01 NOTE — Progress Notes (Signed)
Dr fields in to assess/routine. No changes

## 2014-02-01 NOTE — Anesthesia Preprocedure Evaluation (Addendum)
Anesthesia Evaluation  Patient identified by MRN, date of birth, ID band Patient awake    Reviewed: Allergy & Precautions, H&P , NPO status , Patient's Chart, lab work & pertinent test results, reviewed documented beta blocker date and time   Airway Mallampati: II TM Distance: >3 FB Neck ROM: full    Dental  (+) Missing   Pulmonary former smoker,  breath sounds clear to auscultation        Cardiovascular hypertension, + CAD and + Peripheral Vascular Disease + dysrhythmias Atrial Fibrillation Rhythm:Irregular     Neuro/Psych PSYCHIATRIC DISORDERS Schizophrenia CVA, Residual Symptoms    GI/Hepatic negative GI ROS, Neg liver ROS,   Endo/Other  negative endocrine ROS  Renal/GU negative Renal ROS  negative genitourinary   Musculoskeletal   Abdominal   Peds  Hematology negative hematology ROS (+)   Anesthesia Other Findings See surgeon's H&P   Reproductive/Obstetrics negative OB ROS                          Anesthesia Physical Anesthesia Plan  ASA: III  Anesthesia Plan: General   Post-op Pain Management:    Induction: Intravenous  Airway Management Planned: Oral ETT  Additional Equipment: Arterial line  Intra-op Plan:   Post-operative Plan: Extubation in OR  Informed Consent: I have reviewed the patients History and Physical, chart, labs and discussed the procedure including the risks, benefits and alternatives for the proposed anesthesia with the patient or authorized representative who has indicated his/her understanding and acceptance.   Dental Advisory Given  Plan Discussed with: CRNA, Surgeon and Anesthesiologist  Anesthesia Plan Comments:        Anesthesia Quick Evaluation

## 2014-02-02 ENCOUNTER — Telehealth: Payer: Self-pay | Admitting: Vascular Surgery

## 2014-02-02 ENCOUNTER — Other Ambulatory Visit: Payer: Self-pay

## 2014-02-02 DIAGNOSIS — I6529 Occlusion and stenosis of unspecified carotid artery: Secondary | ICD-10-CM

## 2014-02-02 LAB — BASIC METABOLIC PANEL
Anion gap: 13 (ref 5–15)
BUN: 13 mg/dL (ref 6–23)
CALCIUM: 8.6 mg/dL (ref 8.4–10.5)
CHLORIDE: 101 meq/L (ref 96–112)
CO2: 23 meq/L (ref 19–32)
CREATININE: 1.02 mg/dL (ref 0.50–1.35)
GFR calc Af Amer: 85 mL/min — ABNORMAL LOW (ref >90)
GFR calc non Af Amer: 73 mL/min — ABNORMAL LOW (ref >90)
GLUCOSE: 103 mg/dL — AB (ref 70–99)
Potassium: 4.1 mEq/L (ref 3.7–5.3)
SODIUM: 137 meq/L (ref 137–147)

## 2014-02-02 LAB — CBC
HCT: 35.5 % — ABNORMAL LOW (ref 39.0–52.0)
HEMOGLOBIN: 11.7 g/dL — AB (ref 13.0–17.0)
MCH: 29.1 pg (ref 26.0–34.0)
MCHC: 33 g/dL (ref 30.0–36.0)
MCV: 88.3 fL (ref 78.0–100.0)
Platelets: 223 10*3/uL (ref 150–400)
RBC: 4.02 MIL/uL — AB (ref 4.22–5.81)
RDW: 14 % (ref 11.5–15.5)
WBC: 6.3 10*3/uL (ref 4.0–10.5)

## 2014-02-02 MED ORDER — OXYCODONE HCL 5 MG PO TABS: 5.0000 mg | ORAL_TABLET | Freq: Four times a day (QID) | ORAL | Status: DC | PRN

## 2014-02-02 NOTE — Progress Notes (Addendum)
  [facility] Progress Note  02/02/2014 7:37 AM 1 Day Post-Op  Subjective:  States he is hungry  Afebrile HR  80's-110's irregular 144'R-154'M systolic  (086 yesterday afternoon at 1400 --since then, he has been 761'P-509'T systolic) 26% RA  Filed Vitals:   02/02/14 0700  BP: 122/70  Pulse: 76  Temp:   Resp: 18    Physical Exam: Incisions:  Bilateral groins are soft without hematoma Extremities:  +palpable DP bilaterally Abdomen:  Soft NT/ND Lungs:  Non labored  CBC    Component Value Date/Time   WBC 6.3 02/02/2014 0415   RBC 4.02* 02/02/2014 0415   HGB 11.7* 02/02/2014 0415   HCT 35.5* 02/02/2014 0415   PLT 223 02/02/2014 0415   MCV 88.3 02/02/2014 0415   MCH 29.1 02/02/2014 0415   MCHC 33.0 02/02/2014 0415   RDW 14.0 02/02/2014 0415    BMET    Component Value Date/Time   NA 137 02/02/2014 0415   K 4.1 02/02/2014 0415   CL 101 02/02/2014 0415   CO2 23 02/02/2014 0415   GLUCOSE 103* 02/02/2014 0415   BUN 13 02/02/2014 0415   CREATININE 1.02 02/02/2014 0415   CALCIUM 8.6 02/02/2014 0415   GFRNONAA 73* 02/02/2014 0415   GFRAA 85* 02/02/2014 0415    INR    Component Value Date/Time   INR 1.27 02/01/2014 1320     Intake/Output Summary (Last 24 hours) at 02/02/14 0737 Last data filed at 02/02/14 0700  Gross per 24 hour  Intake 1940.83 ml  Output   2715 ml  Net -774.17 ml     Assessment:  68 y.o. male is s/p:  Excluder Stent graft repair of Infrarenal Aortic Aneurysm   1 Day Post-Op  Plan: -doing well this am -foley out in the past hour-needs to void -dc A-line -ambulate Home later today -DVT prophylaxis:  Lovenox to start later today if pt is still here -Savaysa to be restarted today -f/u with Dr. Oneida Alar in 4 weeks with CTA    Leontine Locket, PA-C [facility] [phone] 02/02/2014 7:37 AM    Agree with above, restart Savaysa, no Lovenox Needs bilateral carotid duplex in one month with office visit to address carotid  disease  Ruta Hinds, MD [facility] [place]: [phone] Pager: [phone]

## 2014-02-02 NOTE — Progress Notes (Signed)
Volunteer services called to wheel patient out to front entrance of [employer] to meet his son whom will be driving him home

## 2014-02-02 NOTE — Telephone Encounter (Signed)
Message copied by Gena Fray on Tue Feb 02, 2014 10:24 AM ------      Message from: Denman George      Created: Mon Feb 01, 2014  2:25 PM      Regarding: needs 4 wk vasc lab and f/u appt w/ [facility]                    ----- Message -----         From: Gabriel Earing, PA-C         Sent: 02/01/2014   1:09 PM           To: [facility] Charge Pool            S/p EVAR 02/01/14.  F/u with [facility] in 4 weeks with CTA protocol.            Thanks      Aldona Bar ------

## 2014-02-02 NOTE — Progress Notes (Signed)
Discharge instructions and education completed with patients son in room, Wesley [name], A copy of all instructions along with prescription were given to the son, Wesley [name]. All questions were answered. Pt stable at time of discharge. All belongings were sent with patient

## 2014-02-02 NOTE — Telephone Encounter (Signed)
Received additional message from [facility] that patient needs a bilateral carotid at time of follow up. Patient is currently admitted. Waiting for discharge to schedule CTA. dpm

## 2014-02-03 ENCOUNTER — Other Ambulatory Visit: Payer: Self-pay

## 2014-02-03 ENCOUNTER — Encounter (HOSPITAL_COMMUNITY): Payer: Self-pay | Admitting: Vascular Surgery

## 2014-02-03 DIAGNOSIS — G8918 Other acute postprocedural pain: Secondary | ICD-10-CM

## 2014-02-03 MED ORDER — OXYCODONE HCL 5 MG PO TABS: 5.0000 mg | ORAL_TABLET | Freq: Four times a day (QID) | ORAL | Status: DC | PRN

## 2014-02-03 NOTE — Progress Notes (Signed)
Reordered/ printed Rx for Oxycodone IR 5 mg tablet, due to initial Rx not signed upon hospital discharge.  Pt's son here to pick up script.

## 2014-02-04 NOTE — Telephone Encounter (Signed)
Spoke with patients son, Jenny Reichmann to inform. Mailed detailed CT letter.  dpm

## 2014-02-11 NOTE — Discharge Summary (Signed)
[facility] Discharge Summary  Wesley [name] 1945-11-11 68 y.o. male  [id number]  Admission Date: 02/01/2014  Discharge Date: 02/02/14  Physician: No att. providers found  Admission Diagnosis: Abdominal aneurysm without mention of rupture    HPI:   This is a 68 y.o. male who presents for evaluation of asymptomatic high-grade carotid stenosis and asymptomatic infrarenal abdominal aortic aneurysm. He denies abdominal or back pain. He denies recent symptoms of TIA amaurosis or stroke. He is a former smoker quit approximately 2 years ago to The patient lives with his son that overall performance his daily activities independently. He does have a history of abdominal aortic aneurysm in his sister. Other medical problems include psychiatric history with possible history of schizophrenia, "heat stroke" in 1987 details of this event are unavailable, chronic right upper extremity tremor and weakness since his "heat stroke"  Hospital Course:  The patient was admitted to the hospital and taken to the operating room on 02/01/2014 and underwent: #1 Bilateral Proglide closure  #2 28x14x12 cm main body Gore Excluder device delivered via a right femoral system  #3 14 x 14 cm left iliac limb contralateral  #4 20 x 9.5 ipsilateral iliac extension right    The pt tolerated the procedure well and was transported to the PACU in good condition. Post operatively, he did have some memory deficits to month, year and president immediately post op, but was resolving.  Otherwise, he had a non focal neuro exam.  By  POD 1, he was doing well with palpable DP pulse bilaterally.  His memory deficits had resolved.  Wesley [name] was restarted and the pt discharged home.  He is to f/u in the office in one month with a carotid duplex.  The remainder of the hospital course consisted of increasing mobilization and increasing intake of solids without difficulty.  CBC    Component Value Date/Time   WBC  6.3 02/02/2014 0415   RBC 4.02* 02/02/2014 0415   HGB 11.7* 02/02/2014 0415   HCT 35.5* 02/02/2014 0415   PLT 223 02/02/2014 0415   MCV 88.3 02/02/2014 0415   MCH 29.1 02/02/2014 0415   MCHC 33.0 02/02/2014 0415   RDW 14.0 02/02/2014 0415    BMET    Component Value Date/Time   NA 137 02/02/2014 0415   K 4.1 02/02/2014 0415   CL 101 02/02/2014 0415   CO2 23 02/02/2014 0415   GLUCOSE 103* 02/02/2014 0415   BUN 13 02/02/2014 0415   CREATININE 1.02 02/02/2014 0415   CALCIUM 8.6 02/02/2014 0415   GFRNONAA 73* 02/02/2014 0415   GFRAA 85* 02/02/2014 0415     Discharge Instructions:   The patient is discharged with extensive instructions on wound care and progressive ambulation.  They are instructed not to drive or perform any heavy lifting until returning to see the physician in his office.  Discharge Instructions   ABDOMINAL PROCEDURE/ANEURYSM REPAIR/AORTO-BIFEMORAL BYPASS:  Call MD for increased abdominal pain; cramping diarrhea; nausea/vomiting    Complete by:  As directed      Call MD for:  redness, tenderness, or signs of infection (pain, swelling, bleeding, redness, odor or green/yellow discharge around incision site)    Complete by:  As directed      Call MD for:  severe or increased pain, loss or decreased feeling  in affected limb(s)    Complete by:  As directed      Call MD for:  temperature >100.5    Complete by:  As directed  Discharge patient    Complete by:  As directed   Discharge pt to home     Discharge wound care:    Complete by:  As directed   Shower daily with soap and water starting 02/03/14     Driving Restrictions    Complete by:  As directed   No driving for 2 weeks     Lifting restrictions    Complete by:  As directed   No lifting for 4 weeks     Resume previous diet    Complete by:  As directed            Discharge Diagnosis:  Abdominal aneurysm without mention of rupture   Secondary Diagnosis: Patient Active Problem List   Diagnosis Date Noted  . AAA (abdominal aortic  aneurysm) 02/01/2014  . S/P PTCA (percutaneous transluminal coronary angioplasty) 01/19/2014  . AAA (abdominal aortic aneurysm) without rupture 12/31/2013  . Occlusion and stenosis of carotid artery without mention of cerebral infarction 12/31/2013   Past Medical History  Diagnosis Date  . AAA (abdominal aortic aneurysm)   . Carotid artery occlusion   . Hypertension   . Coronary artery disease   . Stroke 04/16/1986    "had stroke in right arm; it's been weak since"  . Arthritis     "legs" (01/19/2014)  . Chronic back pain     "neck to tailbone" (01/19/2014)  . Schizophrenia     takes Haldol  . Dysrhythmia     Atrial Fib       Medication List         atenolol 100 MG tablet  Commonly known as:  TENORMIN  Take 1 tablet (100 mg total) by mouth 2 (two) times daily.     cloNIDine 0.1 MG tablet  Commonly known as:  CATAPRES  Take 0.1 mg by mouth daily.     clopidogrel 75 MG tablet  Commonly known as:  PLAVIX  Take 1 tablet (75 mg total) by mouth daily with breakfast.     haloperidol 5 MG tablet  Commonly known as:  HALDOL  Take 2.5 mg by mouth at bedtime.     hydrALAZINE 50 MG tablet  Commonly known as:  APRESOLINE  Take 50 mg by mouth daily.     isosorbide mononitrate 60 MG 24 hr tablet  Commonly known as:  IMDUR  Take 60 mg by mouth at bedtime.     SAVAYSA 60 MG Tabs tablet  Generic drug:  edoxaban  Take 60 mg by mouth daily.     simvastatin 40 MG tablet  Commonly known as:  ZOCOR  Take 40 mg by mouth at bedtime.     trihexyphenidyl 2 MG tablet  Commonly known as:  ARTANE  Take 2 mg by mouth daily.          Disposition: home  Patient's condition: is Good  Follow up: 1. Wesley [name] in 4 weeks with CTA and carotid duplex scan   Wesley Locket, PA-C [facility] [id number] 02/11/2014  9:03 AM   - For VQI Registry use --- Instructions: Press F2 to tab through selections.  Delete question if not applicable.   Post-op:  Time  to Extubation: [x ] In OR, [ ]  < 12 hrs, [ ]  12-24 hrs, [ ]  >=24 hrs Vasopressors Req. Post-op: No MI: No., [ ]  Troponin only, [ ]  EKG or Clinical New Arrhythmia: No CHF: No ICU Stay: 1 day in stepdown Transfusion: No  If yes, n/a units  given  Complications: Resp failure: No., [ ]  Pneumonia, [ ]  Ventilator Chg in renal function: No., [ ]  Inc. Cr > 0.5, [ ]  Temp. Dialysis, [ ]  Permanent dialysis Leg ischemia: No., no Surgery needed, [ ]  Yes, Surgery needed, [ ]  Amputation Bowel ischemia: No., [ ]  Medical Rx, [ ]  Surgical Rx Wound complication: No., [ ]  Superficial separation/infection, [ ]  Return to OR Return to OR: No  Return to OR for bleeding: No Stroke: No., [ ]  Minor, [ ]  Major  Discharge medications: Statin use:  Yes If No: [ ]  For Medical reasons, [ ]  Non-compliant, [ ]  Not-indicated ASA use:  No  If No: [ ]  For Medical reasons, [ ]  Non-compliant, [ ]  Not-indicated Plavix use:  Yes If No: [ ]  For Medical reasons, [ ]  Non-compliant, [ ]  Not-indicated Beta blocker use:  Yes If No: [ ]  For Medical reasons, [ ]  Non-compliant, [ ]  Not-indicated

## 2014-03-03 ENCOUNTER — Encounter: Payer: Self-pay | Admitting: Family

## 2014-03-04 ENCOUNTER — Ambulatory Visit
Admission: RE | Admit: 2014-03-04 | Discharge: 2014-03-04 | Disposition: A | Discharge status: Home / Self Care | Payer: Medicare Other | Source: Ambulatory Visit | Attending: Vascular Surgery | Admitting: Vascular Surgery

## 2014-03-04 ENCOUNTER — Encounter (INDEPENDENT_AMBULATORY_CARE_PROVIDER_SITE_OTHER): Payer: Self-pay

## 2014-03-04 ENCOUNTER — Encounter: Payer: Self-pay | Admitting: Vascular Surgery

## 2014-03-04 ENCOUNTER — Ambulatory Visit (HOSPITAL_COMMUNITY)
Admit: 2014-03-04 | Discharge: 2014-03-04 | Disposition: A | Discharge status: Home / Self Care | Payer: Medicare Other | Source: Ambulatory Visit | Attending: Vascular Surgery | Admitting: Vascular Surgery

## 2014-03-04 ENCOUNTER — Ambulatory Visit (INDEPENDENT_AMBULATORY_CARE_PROVIDER_SITE_OTHER): Payer: Medicare Other | Admitting: Vascular Surgery

## 2014-03-04 VITALS — BP 112/68 | HR 82 | Ht 73.0 in | Wt 192.0 lb

## 2014-03-04 DIAGNOSIS — I714 Abdominal aortic aneurysm, without rupture, unspecified: Secondary | ICD-10-CM

## 2014-03-04 DIAGNOSIS — I6529 Occlusion and stenosis of unspecified carotid artery: Secondary | ICD-10-CM

## 2014-03-04 DIAGNOSIS — Z48812 Encounter for surgical aftercare following surgery on the circulatory system: Secondary | ICD-10-CM

## 2014-03-04 MED ORDER — IOHEXOL 350 MG/ML SOLN
80.0000 mL | Freq: Once | INTRAVENOUS | Status: AC | PRN
  Administered 2014-03-04: 80 mL via INTRAVENOUS

## 2014-03-04 NOTE — Progress Notes (Signed)
[facility] [place] HISTORY AND PHYSICAL   Referring: Clayton Lefort, M.D. History of Present Illness:  Patient is a 68 y.o. year old male who presents for evaluation of asymptomatic high-grade carotid stenosis.  He underwent repair of infrarenal abdominal aortic aneurysm with a Gore Excluder device on July 6.  He denies abdominal or back pain. He denies recent symptoms of TIA amaurosis or stroke. He is a former smoker quit approximately 2 years ago to The patient lives with his son that overall performance his daily activities independently. He does have a history of abdominal aortic aneurysm in his sister. 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 He is on Savaysa for anticoagulation. This is for atrial fibrillation.    Past Medical History   Diagnosis  Date   .  AAA (abdominal aortic aneurysm)     .  Carotid artery occlusion     .  Stroke  04/16/1986   Atrial fibrillation    Past Surgical History   Procedure  Laterality  Date   .  Nerve block limb  Right       Social History History   Substance Use Topics   .  Smoking status:  Former Smoker -- 1.00 packs/day for 58 years       Types:  Cigarettes       Quit date:  12/29/2011   .  Smokeless tobacco:  Not on file   .  Alcohol Use:  No     Family History Family History   Problem  Relation  Age of Onset   .  Cancer  Father     .  Hyperlipidemia  Father     .  Hypertension  Father     .  Heart disease  Sister     .  AAA (abdominal aortic aneurysm)  Sister       Allergies  No Known Allergies     Current Outpatient Prescriptions on File Prior to Visit  Medication Sig Dispense Refill  . atenolol (TENORMIN) 100 MG tablet Take 1 tablet (100 mg total) by mouth 2 (two) times daily.  180 tablet  1  . cloNIDine (CATAPRES) 0.1 MG tablet  Take 0.1 mg by mouth daily.       . clopidogrel (PLAVIX) 75 MG tablet Take 1 tablet (75 mg total) by mouth daily with breakfast.  30 tablet  6  . Edoxaban Tosylate (SAVAYSA) 60 MG TABS Take 60 mg by mouth daily.      . haloperidol (HALDOL) 5 MG tablet Take 2.5 mg by mouth at bedtime.       . hydrALAZINE (APRESOLINE) 50 MG tablet Take 50 mg by mouth daily.      . isosorbide mononitrate (IMDUR) 60 MG 24 hr tablet Take 60 mg by mouth at bedtime.      Marland Kitchen oxyCODONE (ROXICODONE) 5 MG immediate release tablet Take 1 tablet (5 mg total) by mouth every 6 (six) hours as needed for severe pain.  15 tablet  0  . simvastatin (ZOCOR) 40 MG tablet Take 40 mg by mouth at bedtime.       . trihexyphenidyl (ARTANE) 2 MG tablet Take 2 mg by mouth daily.        No current facility-administered medications on file prior to visit.   ROS:    General:  No weight  loss, Fever, chills  Neurologic: No dizziness, blackouts, seizures. No recent symptoms of stroke or mini- stroke. No recent episodes of slurred speech, or temporary blindness.  Cardiac: No recent episodes of chest pain/pressure, no shortness of breath at rest.  + shortness of breath with exertion.  Denies history of atrial fibrillation or irregular heartbeat  Vascular: No history of rest pain in feet.  No history of claudication.  No history of non-healing ulcer, No history of DVT      Physical Examination     Filed Vitals:   03/04/14 1600 03/04/14 1602  BP: 104/71 112/68  Pulse: 82   Height: 6\' 1"  (1.854 m)   Weight: 192 lb (87.091 kg)   SpO2: 100%    General:  Alert and oriented, no acute distress HEENT: Normal Neck: No bruit or JVD Pulmonary: Clear to auscultation bilaterally Cardiac: Regular Rate and Rhythm without murmur Abdomen: Soft, non-tender, non-distended, no mass Skin: No rash Extremity Pulses:  2+ radial, brachial, femoral, dorsalis pedis, posterior tibial pulses bilaterally Musculoskeletal: No deformity or edema      Neurologic: Upper and lower extremity motor 5/5 and symmetric, tremor right hand  DATA:  patient had a carotid duplex scan today which showed a 60-80% right internal carotid artery stenosis and greater than 80% left internal carotid artery stenosis.  CT Angio of the head and neck is also reviewed. This shows a 75% right internal carotid artery stenosis and an 85% left internal carotid artery stenosis  CT scan of abdomen and pelvis is performed today post stent graft this shows a stent graft in good position with the proximal aspect just below the renal arteries. There is no endoleak. Aneurysm diameter is 5.8 cm.   ASSESSMENT:  #1 status post repair of abdominal aortic aneurysm needs  followup CT scan in 5 months #2 bilateral high grade internal carotid artery stenosis, asymptomatic left greater than right   PLAN:  needs left carotid endarterectomy for stroke prophylaxis. He will continue his Plavix. We will stop his Savaysa perioperatively. Left carotid endarterectomy scheduled for 03/16/2014. Risks benefits possible complications and procedure details including but not limited to cranial nerve injury bleeding infection stroke risk were explained the patient and his son today. They understand and agree to proceed.  Ruta Hinds, MD [facility] [place] Office: [phone] Pager: [phone]

## 2014-03-05 ENCOUNTER — Other Ambulatory Visit: Payer: Self-pay | Admitting: *Deleted

## 2014-03-10 ENCOUNTER — Encounter (HOSPITAL_COMMUNITY): Payer: Self-pay | Admitting: Pharmacy Technician

## 2014-03-10 DIAGNOSIS — Z95828 Presence of other vascular implants and grafts: Secondary | ICD-10-CM | POA: Diagnosis not present

## 2014-03-10 DIAGNOSIS — I714 Abdominal aortic aneurysm, without rupture, unspecified: Secondary | ICD-10-CM | POA: Diagnosis not present

## 2014-03-10 DIAGNOSIS — I4891 Unspecified atrial fibrillation: Secondary | ICD-10-CM | POA: Diagnosis not present

## 2014-03-10 DIAGNOSIS — Z8673 Personal history of transient ischemic attack (TIA), and cerebral infarction without residual deficits: Secondary | ICD-10-CM | POA: Diagnosis not present

## 2014-03-10 NOTE — Pre-Procedure Instructions (Signed)
Wesley [name]  03/10/2014   Your procedure is scheduled on:  Tues, Aug 18 @ 7:45 AM  Report to [facility]  at 5:30 AM.  Call this number if you have problems the morning of surgery: [phone]   Remember:   Do not eat food or drink liquids after midnight.   Take these medicines the morning of surgery with A SIP OF WATER: Atenolol(Tenormin),Clonidine(Catapres),and Pain Pill(if needed)               Follow instructions per Dr.Ganji regarding Plavix. No Goody's,BC's,Aleve,Ibuprofen,Fish Oil,or any Herbal Medications   Do not wear jewelry  Do not wear lotions, powders, or colognes. You may wear deodorant.  Men may shave face and neck.  Do not bring valuables to the hospital.  [facility] is not responsible                  for any belongings or valuables.               Contacts, dentures or bridgework may not be worn into surgery.  Leave suitcase in the car. After surgery it may be brought to your room.  For patients admitted to the hospital, discharge time is determined by your                treatment team.                Special Instructions:  [facility] - Preparing for Surgery  Before surgery, you can play an important role.  Because skin is not sterile, your skin needs to be as free of germs as possible.  You can reduce the number of germs on you skin by washing with CHG (chlorahexidine gluconate) soap before surgery.  CHG is an antiseptic cleaner which kills germs and bonds with the skin to continue killing germs even after washing.  Please DO NOT use if you have an allergy to CHG or antibacterial soaps.  If your skin becomes reddened/irritated stop using the CHG and inform your nurse when you arrive at Short Stay.  Do not shave (including legs and underarms) for at least 48 hours prior to the first CHG shower.  You may shave your face.  Please follow these instructions carefully:   1.  Shower with CHG Soap the night before surgery and the                                 morning of Surgery.  2.  If you choose to wash your hair, wash your hair first as usual with your       normal shampoo.  3.  After you shampoo, rinse your hair and body thoroughly to remove the                      Shampoo.  4.  Use CHG as you would any other liquid soap.  You can apply chg directly       to the skin and wash gently with scrungie or a clean washcloth.  5.  Apply the CHG Soap to your body ONLY FROM THE NECK DOWN.        Do not use on open wounds or open sores.  Avoid contact with your eyes,       ears, mouth and genitals (private parts).  Wash genitals (private parts)       with your normal soap.  6.  Wash thoroughly, paying special attention to the area where your surgery        will be performed.  7.  Thoroughly rinse your body with warm water from the neck down.  8.  DO NOT shower/wash with your normal soap after using and rinsing off       the CHG Soap.  9.  Pat yourself dry with a clean towel.            10.  Wear clean pajamas.            11.  Place clean sheets on your bed the night of your first shower and do not        sleep with pets.  Day of Surgery  Do not apply any lotions/deoderants the morning of surgery.  Please wear clean clothes to the hospital/surgery center.    Please read over the following fact sheets that you were given: Pain Booklet, Coughing and Deep Breathing, Blood Transfusion Information, MRSA Information and Surgical Site Infection Prevention

## 2014-03-11 ENCOUNTER — Encounter (HOSPITAL_COMMUNITY): Payer: Self-pay

## 2014-03-11 ENCOUNTER — Encounter (HOSPITAL_COMMUNITY)
Admission: RE | Admit: 2014-03-11 | Discharge: 2014-03-11 | Disposition: A | Discharge status: Home / Self Care | Payer: Medicare Other | Source: Ambulatory Visit | Attending: Vascular Surgery | Admitting: Vascular Surgery

## 2014-03-11 DIAGNOSIS — Z01818 Encounter for other preprocedural examination: Secondary | ICD-10-CM | POA: Diagnosis not present

## 2014-03-11 DIAGNOSIS — Z8673 Personal history of transient ischemic attack (TIA), and cerebral infarction without residual deficits: Secondary | ICD-10-CM | POA: Diagnosis not present

## 2014-03-11 DIAGNOSIS — I2510 Atherosclerotic heart disease of native coronary artery without angina pectoris: Secondary | ICD-10-CM | POA: Insufficient documentation

## 2014-03-11 DIAGNOSIS — I10 Essential (primary) hypertension: Secondary | ICD-10-CM | POA: Diagnosis not present

## 2014-03-11 DIAGNOSIS — G8929 Other chronic pain: Secondary | ICD-10-CM | POA: Insufficient documentation

## 2014-03-11 DIAGNOSIS — Z87891 Personal history of nicotine dependence: Secondary | ICD-10-CM | POA: Insufficient documentation

## 2014-03-11 DIAGNOSIS — I779 Disorder of arteries and arterioles, unspecified: Secondary | ICD-10-CM | POA: Insufficient documentation

## 2014-03-11 DIAGNOSIS — I4891 Unspecified atrial fibrillation: Secondary | ICD-10-CM | POA: Diagnosis not present

## 2014-03-11 DIAGNOSIS — Z01812 Encounter for preprocedural laboratory examination: Secondary | ICD-10-CM | POA: Diagnosis not present

## 2014-03-11 DIAGNOSIS — Z9861 Coronary angioplasty status: Secondary | ICD-10-CM | POA: Insufficient documentation

## 2014-03-11 HISTORY — DX: Dysphagia, unspecified: R13.10

## 2014-03-11 HISTORY — DX: Dorsalgia, unspecified: M54.9

## 2014-03-11 HISTORY — DX: Personal history of other diseases of the respiratory system: Z87.09

## 2014-03-11 HISTORY — DX: Hyperlipidemia, unspecified: E78.5

## 2014-03-11 HISTORY — DX: Unspecified convulsions: R56.9

## 2014-03-11 HISTORY — DX: Personal history of other medical treatment: Z92.89

## 2014-03-11 HISTORY — DX: Personal history of colon polyps, unspecified: Z86.0100

## 2014-03-11 HISTORY — DX: Personal history of colonic polyps: Z86.010

## 2014-03-11 LAB — TYPE AND SCREEN
ABO/RH(D): O POS
Antibody Screen: NEGATIVE

## 2014-03-11 LAB — COMPREHENSIVE METABOLIC PANEL
ALT: 17 U/L (ref 0–53)
AST: 18 U/L (ref 0–37)
Albumin: 3.4 g/dL — ABNORMAL LOW (ref 3.5–5.2)
Alkaline Phosphatase: 67 U/L (ref 39–117)
Anion gap: 16 — ABNORMAL HIGH (ref 5–15)
BUN: 17 mg/dL (ref 6–23)
CALCIUM: 8.9 mg/dL (ref 8.4–10.5)
CO2: 19 meq/L (ref 19–32)
CREATININE: 1.09 mg/dL (ref 0.50–1.35)
Chloride: 102 mEq/L (ref 96–112)
GFR calc non Af Amer: 67 mL/min — ABNORMAL LOW (ref >90)
GFR, EST AFRICAN AMERICAN: 78 mL/min — AB (ref >90)
Glucose, Bld: 86 mg/dL (ref 70–99)
Potassium: 3.9 mEq/L (ref 3.7–5.3)
Sodium: 137 mEq/L (ref 137–147)
Total Bilirubin: 0.4 mg/dL (ref 0.3–1.2)
Total Protein: 7.1 g/dL (ref 6.0–8.3)

## 2014-03-11 LAB — SURGICAL PCR SCREEN
MRSA, PCR: NEGATIVE
Staphylococcus aureus: NEGATIVE

## 2014-03-11 LAB — CBC
HCT: 38.5 % — ABNORMAL LOW (ref 39.0–52.0)
Hemoglobin: 12.8 g/dL — ABNORMAL LOW (ref 13.0–17.0)
MCH: 28.9 pg (ref 26.0–34.0)
MCHC: 33.2 g/dL (ref 30.0–36.0)
MCV: 86.9 fL (ref 78.0–100.0)
PLATELETS: 208 10*3/uL (ref 150–400)
RBC: 4.43 MIL/uL (ref 4.22–5.81)
RDW: 14.4 % (ref 11.5–15.5)
WBC: 4.9 10*3/uL (ref 4.0–10.5)

## 2014-03-11 LAB — URINALYSIS, ROUTINE W REFLEX MICROSCOPIC
BILIRUBIN URINE: NEGATIVE
Glucose, UA: NEGATIVE mg/dL
HGB URINE DIPSTICK: NEGATIVE
Ketones, ur: NEGATIVE mg/dL
Leukocytes, UA: NEGATIVE
Nitrite: NEGATIVE
PROTEIN: NEGATIVE mg/dL
Specific Gravity, Urine: 1.008 (ref 1.005–1.030)
UROBILINOGEN UA: 0.2 mg/dL (ref 0.0–1.0)
pH: 5.5 (ref 5.0–8.0)

## 2014-03-11 MED ORDER — CHLORHEXIDINE GLUCONATE 4 % EX LIQD: 60.0000 mL | Freq: Once | CUTANEOUS | Status: DC

## 2014-03-11 NOTE — Progress Notes (Addendum)
Cardiologist is Dr.Ganji to request visit  EKG repeated by [facility] 03/10/14  Stress test done about 88months ago  Unsure if he has ever had an echo but will check with [place] cath report in epic from 2015  EKG and CXR in epic from 01-19-14  Medical MD is Dr.Edwin Avbuere

## 2014-03-11 NOTE — Progress Notes (Signed)
03/11/14 0841  OBSTRUCTIVE SLEEP APNEA  Have you ever been diagnosed with sleep apnea through a sleep study? No  Do you snore loudly (loud enough to be heard through closed doors)?  0  Do you often feel tired, fatigued, or sleepy during the daytime? 0  Has anyone observed you stop breathing during your sleep? 1  Do you have, or are you being treated for high blood pressure? 1  BMI more than 35 kg/m2? 0  Age over 70 years old? 1  Neck circumference greater than 40 cm/16 inches? 1  Gender: 1  Obstructive Sleep Apnea Score 5  Score 4 or greater  Results sent to PCP

## 2014-03-11 NOTE — Progress Notes (Signed)
Main lab called Wesley [name] to say that not enough blood obtained to run PT

## 2014-03-12 NOTE — Progress Notes (Addendum)
Anesthesia Chart Review: Patient is a 68 year old male scheduled for left carotid endarterectomy on 03/16/14 by Dr. Oneida Alar.    History includes afib, CAD s/p non-DES stent to distal RCA 01/19/14, AAA s/p endovascular repair 02/01/14, carotid artery stenosis, HTN, CVA with RUE weakness '87, chronic back pain, schizophrenia, former smoker, cataract extraction. OSA screening score was 5. PCP is Dr. Jeanie Cooks. Notes indicate that he will stay on Plavix but hold Savaysa preoperatively. Cardiologist is Dr. Einar Gip who is aware of plans for surgery.   He had an abnormal nuclear stress test on 10/24/13 showing a moderate sized severe ischemia in the mid inferior wall and inferolateral wall. EF 61%. He subsequently had a cardiac cath on 01/19/14 showed:  Left ventricle: Performed in the RAO projection revealed LVEF of 65 %. There was no significant MR. no wall motion abnormality.  Right coronary artery: Dominant. If you very large caliber vessel. There is a high-grade 99% stenosis followed by a 60-70% stenosis in the distal RCA. Very large PL branch and a moderate size PDA. There is mild diffuse luminal irregularity in the proximal to mid segments. There were no collaterals to the right coronary artery from the contralateral side. S/P successful PTCA and Rebel Non-DES to distal RCA. Will need antiplatelet therapy with Plavix and will continue Savyaso for A. Fib. No ASA due to increased risk of bleed.  Left main coronary artery is large and normal.  Circumflex coronary artery: A moderate to small sized vessel giving origin to a moderate sized obtuse marginal 1. Mild luminal noted  LAD: LAD gives origin to several small diagonals, D3 his small to moderate. LAD has mild diffuse luminal irregularities.  Ramus intermediate: Very large vessel, giving origin to a large secondary branch, a second branch has multiple branches. Mild luminal irregularity evident.   Echo on 11/12/13 [facility] CV) showed: Normal LV cavity size. Mild  concentric LVH. Calculated EF 62%. No LV wall motion abnormalities. Mildly dilated LA/RA. Moderate MR. Moderate TR. Mild pulmonary hypertension with approximate PA systolic pressure 40 mmHg.   EKG on 03/10/14 [facility] CV) showed afib @ 77 bpm.   Carotid duplex 03/04/14 showed: 60-79% RICA stenosis and 17-49% LICA stenosis, antegrade VA flow. 12/09/13 CT Angio of the head and neck showed a 75% right internal carotid artery stenosis and an 85% left internal carotid artery stenosis.  CTA of the abd/pelvis on 03/04/14 showed: Endovascular repair of the abdominal aortic aneurysm. The stent graft is patent. There has been slight enlargement of the aneurysm sac, measuring up to 5.8 cm, but no evidence for an endoleak. Left renal cysts.   1V CXR on 02/01/14 showed No acute disease. 2VCXR on 01/19/14 showed: Minimal bronchitic changes.   Preoperative labs noted.  There was not enough blood sent at PAT to do coags, so PT/PTT will be done on the day of surgery.  If no acute changes then I anticipate that he can proceed as planned. Prior to his last surgery I reviewed with one of our anesthesiologist and spoke with patient's son regarding preoperative instructions for Artane which patient takes for extrapyramidal symptoms.  He was given permission to take on the morning of surgery if patient felt it really helped his symptoms, although I believe the patient ultimately decided to hold it.    George Hugh [facility] Short Stay Center/Anesthesiology Phone [phone] 03/12/2014 2:06 PM

## 2014-03-15 MED ORDER — SODIUM CHLORIDE 0.9 % IV SOLN
INTRAVENOUS | Status: DC
  Administered 2014-03-16: 20 mL via INTRAVENOUS
  Administered 2014-03-16: 1000 mL via INTRAVENOUS

## 2014-03-15 MED ORDER — CEFUROXIME SODIUM 1.5 G IJ SOLR
1.5000 g | INTRAMUSCULAR | Status: AC
  Administered 2014-03-16: 1.5 g via INTRAVENOUS
  Filled 2014-03-15: qty 1.5

## 2014-03-16 ENCOUNTER — Encounter (HOSPITAL_COMMUNITY)
Admission: RE | Disposition: A | Discharge status: Home / Self Care | Payer: Self-pay | Source: Ambulatory Visit | Attending: Vascular Surgery

## 2014-03-16 ENCOUNTER — Encounter (HOSPITAL_COMMUNITY): Payer: Medicare Other | Admitting: Vascular Surgery

## 2014-03-16 ENCOUNTER — Inpatient Hospital Stay (HOSPITAL_COMMUNITY)
Admission: RE | Admit: 2014-03-16 | Discharge: 2014-03-17 | DRG: 039 | Disposition: A | Discharge status: Home / Self Care | Payer: Medicare Other | Source: Ambulatory Visit | Attending: Vascular Surgery | Admitting: Vascular Surgery

## 2014-03-16 ENCOUNTER — Encounter (HOSPITAL_COMMUNITY): Payer: Self-pay | Admitting: *Deleted

## 2014-03-16 ENCOUNTER — Inpatient Hospital Stay (HOSPITAL_COMMUNITY): Payer: Medicare Other | Admitting: Certified Registered Nurse Anesthetist

## 2014-03-16 DIAGNOSIS — I6523 Occlusion and stenosis of bilateral carotid arteries: Secondary | ICD-10-CM | POA: Diagnosis present

## 2014-03-16 DIAGNOSIS — F209 Schizophrenia, unspecified: Secondary | ICD-10-CM | POA: Diagnosis present

## 2014-03-16 DIAGNOSIS — I6529 Occlusion and stenosis of unspecified carotid artery: Principal | ICD-10-CM

## 2014-03-16 DIAGNOSIS — Z8673 Personal history of transient ischemic attack (TIA), and cerebral infarction without residual deficits: Secondary | ICD-10-CM | POA: Diagnosis not present

## 2014-03-16 DIAGNOSIS — I4891 Unspecified atrial fibrillation: Secondary | ICD-10-CM | POA: Diagnosis present

## 2014-03-16 DIAGNOSIS — Z87891 Personal history of nicotine dependence: Secondary | ICD-10-CM | POA: Diagnosis not present

## 2014-03-16 DIAGNOSIS — I2510 Atherosclerotic heart disease of native coronary artery without angina pectoris: Secondary | ICD-10-CM | POA: Diagnosis not present

## 2014-03-16 DIAGNOSIS — M1990 Unspecified osteoarthritis, unspecified site: Secondary | ICD-10-CM | POA: Diagnosis not present

## 2014-03-16 DIAGNOSIS — I63239 Cerebral infarction due to unspecified occlusion or stenosis of unspecified carotid arteries: Secondary | ICD-10-CM | POA: Diagnosis not present

## 2014-03-16 HISTORY — PX: ENDARTERECTOMY: SHX5162

## 2014-03-16 LAB — PROTIME-INR
INR: 1.09 (ref 0.00–1.49)
Prothrombin Time: 14.1 seconds (ref 11.6–15.2)

## 2014-03-16 LAB — APTT: aPTT: 31 seconds (ref 24–37)

## 2014-03-16 SURGERY — ENDARTERECTOMY, CAROTID
Anesthesia: General | Site: Neck | Laterality: Left

## 2014-03-16 MED ORDER — ALBUMIN HUMAN 5 % IV SOLN
INTRAVENOUS | Status: DC | PRN
  Administered 2014-03-16: 09:00:00 via INTRAVENOUS

## 2014-03-16 MED ORDER — ONDANSETRON HCL 4 MG/2ML IJ SOLN
INTRAMUSCULAR | Status: AC
  Filled 2014-03-16: qty 2

## 2014-03-16 MED ORDER — LABETALOL HCL 5 MG/ML IV SOLN
INTRAVENOUS | Status: AC
  Filled 2014-03-16: qty 4

## 2014-03-16 MED ORDER — ALUM & MAG HYDROXIDE-SIMETH 200-200-20 MG/5ML PO SUSP: 15.0000 mL | ORAL | Status: DC | PRN

## 2014-03-16 MED ORDER — DOCUSATE SODIUM 100 MG PO CAPS
100.0000 mg | ORAL_CAPSULE | Freq: Every day | ORAL | Status: DC
  Administered 2014-03-17: 100 mg via ORAL
  Filled 2014-03-16: qty 1

## 2014-03-16 MED ORDER — PROPOFOL 10 MG/ML IV BOLUS
INTRAVENOUS | Status: AC
  Filled 2014-03-16: qty 20

## 2014-03-16 MED ORDER — DOPAMINE-DEXTROSE 3.2-5 MG/ML-% IV SOLN: 3.0000 ug/kg/min | INTRAVENOUS | Status: DC

## 2014-03-16 MED ORDER — HYDRALAZINE HCL 20 MG/ML IJ SOLN
INTRAMUSCULAR | Status: AC
  Filled 2014-03-16: qty 1

## 2014-03-16 MED ORDER — HYDRALAZINE HCL 20 MG/ML IJ SOLN
10.0000 mg | INTRAMUSCULAR | Status: DC | PRN
  Administered 2014-03-16: 10 mg via INTRAVENOUS
  Filled 2014-03-16: qty 0.5

## 2014-03-16 MED ORDER — SUCCINYLCHOLINE CHLORIDE 20 MG/ML IJ SOLN
INTRAMUSCULAR | Status: DC | PRN
  Administered 2014-03-16: 120 mg via INTRAVENOUS

## 2014-03-16 MED ORDER — ATENOLOL 100 MG PO TABS
100.0000 mg | ORAL_TABLET | Freq: Two times a day (BID) | ORAL | Status: DC
  Administered 2014-03-16 – 2014-03-17 (×2): 100 mg via ORAL
  Filled 2014-03-16 (×3): qty 1

## 2014-03-16 MED ORDER — TRIHEXYPHENIDYL HCL 2 MG PO TABS
2.0000 mg | ORAL_TABLET | Freq: Every day | ORAL | Status: DC
  Administered 2014-03-16 – 2014-03-17 (×2): 2 mg via ORAL
  Filled 2014-03-16 (×2): qty 1

## 2014-03-16 MED ORDER — LISINOPRIL 10 MG PO TABS
10.0000 mg | ORAL_TABLET | Freq: Every day | ORAL | Status: DC
  Administered 2014-03-16 – 2014-03-17 (×2): 10 mg via ORAL
  Filled 2014-03-16 (×2): qty 1

## 2014-03-16 MED ORDER — LIDOCAINE HCL 4 % MT SOLN
OROMUCOSAL | Status: DC | PRN
  Administered 2014-03-16: 4 mL via TOPICAL

## 2014-03-16 MED ORDER — CEFUROXIME SODIUM 1.5 G IJ SOLR
1.5000 g | Freq: Two times a day (BID) | INTRAMUSCULAR | Status: AC
  Administered 2014-03-16 – 2014-03-17 (×2): 1.5 g via INTRAVENOUS
  Filled 2014-03-16 (×2): qty 1.5

## 2014-03-16 MED ORDER — ISOSORBIDE MONONITRATE ER 60 MG PO TB24
60.0000 mg | ORAL_TABLET | Freq: Every day | ORAL | Status: DC
  Administered 2014-03-16: 60 mg via ORAL
  Filled 2014-03-16 (×2): qty 1

## 2014-03-16 MED ORDER — EDOXABAN TOSYLATE 60 MG PO TABS
60.0000 mg | ORAL_TABLET | Freq: Every day | ORAL | Status: DC
  Administered 2014-03-17: 60 mg via ORAL
  Filled 2014-03-16: qty 60

## 2014-03-16 MED ORDER — LIDOCAINE HCL (CARDIAC) 20 MG/ML IV SOLN
INTRAVENOUS | Status: AC
  Filled 2014-03-16: qty 5

## 2014-03-16 MED ORDER — THROMBIN 20000 UNITS EX SOLR
CUTANEOUS | Status: AC
  Filled 2014-03-16: qty 20000

## 2014-03-16 MED ORDER — NITROGLYCERIN IN D5W 200-5 MCG/ML-% IV SOLN: INTRAVENOUS | Status: DC | PRN

## 2014-03-16 MED ORDER — HEPARIN SODIUM (PORCINE) 1000 UNIT/ML IJ SOLN
INTRAMUSCULAR | Status: DC | PRN
  Administered 2014-03-16: 8000 [IU] via INTRAVENOUS
  Administered 2014-03-16: 3000 [IU] via INTRAVENOUS

## 2014-03-16 MED ORDER — LIDOCAINE HCL (PF) 1 % IJ SOLN
INTRAMUSCULAR | Status: AC
  Filled 2014-03-16: qty 30

## 2014-03-16 MED ORDER — HEPARIN SODIUM (PORCINE) 1000 UNIT/ML IJ SOLN
INTRAMUSCULAR | Status: AC
  Filled 2014-03-16: qty 1

## 2014-03-16 MED ORDER — THROMBIN 20000 UNITS EX SOLR
CUTANEOUS | Status: DC | PRN
  Administered 2014-03-16: 10:00:00 via TOPICAL

## 2014-03-16 MED ORDER — GUAIFENESIN-DM 100-10 MG/5ML PO SYRP: 15.0000 mL | ORAL_SOLUTION | ORAL | Status: DC | PRN

## 2014-03-16 MED ORDER — ONDANSETRON HCL 4 MG/2ML IJ SOLN: 4.0000 mg | Freq: Four times a day (QID) | INTRAMUSCULAR | Status: DC | PRN

## 2014-03-16 MED ORDER — BISACODYL 10 MG RE SUPP: 10.0000 mg | Freq: Every day | RECTAL | Status: DC | PRN

## 2014-03-16 MED ORDER — FENTANYL CITRATE 0.05 MG/ML IJ SOLN
INTRAMUSCULAR | Status: AC
  Filled 2014-03-16: qty 5

## 2014-03-16 MED ORDER — ARTIFICIAL TEARS OP OINT
TOPICAL_OINTMENT | OPHTHALMIC | Status: AC
  Filled 2014-03-16: qty 3.5

## 2014-03-16 MED ORDER — MORPHINE SULFATE 2 MG/ML IJ SOLN: 2.0000 mg | INTRAMUSCULAR | Status: DC | PRN

## 2014-03-16 MED ORDER — LIDOCAINE HCL (CARDIAC) 20 MG/ML IV SOLN
INTRAVENOUS | Status: DC | PRN
  Administered 2014-03-16: 60 mg via INTRAVENOUS

## 2014-03-16 MED ORDER — METOPROLOL TARTRATE 1 MG/ML IV SOLN: 2.0000 mg | INTRAVENOUS | Status: DC | PRN

## 2014-03-16 MED ORDER — SODIUM CHLORIDE 0.9 % IV SOLN: 500.0000 mL | Freq: Once | INTRAVENOUS | Status: AC | PRN

## 2014-03-16 MED ORDER — HYDRALAZINE HCL 50 MG PO TABS
50.0000 mg | ORAL_TABLET | Freq: Every day | ORAL | Status: DC
  Administered 2014-03-16 – 2014-03-17 (×2): 50 mg via ORAL
  Filled 2014-03-16 (×2): qty 1

## 2014-03-16 MED ORDER — PROTAMINE SULFATE 10 MG/ML IV SOLN
INTRAVENOUS | Status: AC
  Filled 2014-03-16: qty 5

## 2014-03-16 MED ORDER — ROCURONIUM BROMIDE 50 MG/5ML IV SOLN
INTRAVENOUS | Status: AC
  Filled 2014-03-16: qty 1

## 2014-03-16 MED ORDER — FENTANYL CITRATE 0.05 MG/ML IJ SOLN
INTRAMUSCULAR | Status: DC | PRN
  Administered 2014-03-16 (×3): 25 ug via INTRAVENOUS
  Administered 2014-03-16 (×2): 50 ug via INTRAVENOUS
  Administered 2014-03-16: 25 ug via INTRAVENOUS
  Administered 2014-03-16: 50 ug via INTRAVENOUS

## 2014-03-16 MED ORDER — SODIUM CHLORIDE 0.9 % IJ SOLN
INTRAMUSCULAR | Status: AC
  Filled 2014-03-16: qty 10

## 2014-03-16 MED ORDER — OXYCODONE HCL 5 MG PO TABS: 5.0000 mg | ORAL_TABLET | ORAL | Status: DC | PRN

## 2014-03-16 MED ORDER — CLOPIDOGREL BISULFATE 75 MG PO TABS
75.0000 mg | ORAL_TABLET | Freq: Every day | ORAL | Status: DC
  Administered 2014-03-17: 75 mg via ORAL
  Filled 2014-03-16 (×2): qty 1

## 2014-03-16 MED ORDER — PROPOFOL 10 MG/ML IV BOLUS
INTRAVENOUS | Status: DC | PRN
  Administered 2014-03-16: 100 mg via INTRAVENOUS

## 2014-03-16 MED ORDER — CLONIDINE HCL 0.1 MG PO TABS
0.1000 mg | ORAL_TABLET | Freq: Every day | ORAL | Status: DC
  Administered 2014-03-17: 0.1 mg via ORAL
  Filled 2014-03-16: qty 1

## 2014-03-16 MED ORDER — DEXAMETHASONE SODIUM PHOSPHATE 10 MG/ML IJ SOLN
INTRAMUSCULAR | Status: DC | PRN
  Administered 2014-03-16: 4 mg via INTRAVENOUS

## 2014-03-16 MED ORDER — ACETAMINOPHEN 325 MG PO TABS: 325.0000 mg | ORAL_TABLET | ORAL | Status: DC | PRN

## 2014-03-16 MED ORDER — 0.9 % SODIUM CHLORIDE (POUR BTL) OPTIME
TOPICAL | Status: DC | PRN
  Administered 2014-03-16: 1000 mL

## 2014-03-16 MED ORDER — PHENOL 1.4 % MT LIQD: 1.0000 | OROMUCOSAL | Status: DC | PRN

## 2014-03-16 MED ORDER — NEOSTIGMINE METHYLSULFATE 10 MG/10ML IV SOLN
INTRAVENOUS | Status: AC
  Filled 2014-03-16: qty 1

## 2014-03-16 MED ORDER — SODIUM CHLORIDE 0.9 % IR SOLN
Status: DC | PRN
  Administered 2014-03-16: 09:00:00

## 2014-03-16 MED ORDER — NEOSTIGMINE METHYLSULFATE 10 MG/10ML IV SOLN
INTRAVENOUS | Status: DC | PRN
  Administered 2014-03-16: 3 mg via INTRAVENOUS

## 2014-03-16 MED ORDER — HALOPERIDOL 2 MG PO TABS
2.5000 mg | ORAL_TABLET | Freq: Every day | ORAL | Status: DC
  Administered 2014-03-16: 2.5 mg via ORAL
  Filled 2014-03-16 (×2): qty 1

## 2014-03-16 MED ORDER — SIMVASTATIN 40 MG PO TABS
40.0000 mg | ORAL_TABLET | Freq: Every day | ORAL | Status: DC
  Administered 2014-03-16: 40 mg via ORAL
  Filled 2014-03-16 (×2): qty 1

## 2014-03-16 MED ORDER — ROCURONIUM BROMIDE 100 MG/10ML IV SOLN
INTRAVENOUS | Status: DC | PRN
  Administered 2014-03-16: 30 mg via INTRAVENOUS

## 2014-03-16 MED ORDER — HYDROMORPHONE HCL PF 1 MG/ML IJ SOLN
INTRAMUSCULAR | Status: AC
  Filled 2014-03-16: qty 1

## 2014-03-16 MED ORDER — SODIUM CHLORIDE 0.9 % IV SOLN: INTRAVENOUS | Status: DC

## 2014-03-16 MED ORDER — PHENYLEPHRINE HCL 10 MG/ML IJ SOLN
10.0000 mg | INTRAMUSCULAR | Status: DC | PRN
  Administered 2014-03-16: 10 ug/min via INTRAVENOUS

## 2014-03-16 MED ORDER — LACTATED RINGERS IV SOLN
INTRAVENOUS | Status: DC | PRN
  Administered 2014-03-16 (×3): via INTRAVENOUS

## 2014-03-16 MED ORDER — LABETALOL HCL 5 MG/ML IV SOLN
10.0000 mg | INTRAVENOUS | Status: DC | PRN
  Administered 2014-03-16 (×3): 5 mg via INTRAVENOUS
  Administered 2014-03-17 (×2): 10 mg via INTRAVENOUS
  Filled 2014-03-16 (×3): qty 4

## 2014-03-16 MED ORDER — EPHEDRINE SULFATE 50 MG/ML IJ SOLN
INTRAMUSCULAR | Status: AC
  Filled 2014-03-16: qty 1

## 2014-03-16 MED ORDER — NITROGLYCERIN IN D5W 200-5 MCG/ML-% IV SOLN
INTRAVENOUS | Status: DC | PRN
  Administered 2014-03-16: 5 ug/min via INTRAVENOUS

## 2014-03-16 MED ORDER — ARTIFICIAL TEARS OP OINT
TOPICAL_OINTMENT | OPHTHALMIC | Status: DC | PRN
  Administered 2014-03-16: 1 via OPHTHALMIC

## 2014-03-16 MED ORDER — ONDANSETRON HCL 4 MG/2ML IJ SOLN
INTRAMUSCULAR | Status: DC | PRN
Start: 2014-03-16 — End: 2014-03-16
  Administered 2014-03-16: 4 mg via INTRAVENOUS

## 2014-03-16 MED ORDER — PANTOPRAZOLE SODIUM 40 MG PO TBEC
40.0000 mg | DELAYED_RELEASE_TABLET | Freq: Every day | ORAL | Status: DC
  Administered 2014-03-16 – 2014-03-17 (×2): 40 mg via ORAL
  Filled 2014-03-16: qty 1

## 2014-03-16 MED ORDER — PROTAMINE SULFATE 10 MG/ML IV SOLN
INTRAVENOUS | Status: DC | PRN
  Administered 2014-03-16: 30 mg via INTRAVENOUS
  Administered 2014-03-16: 80 mg via INTRAVENOUS

## 2014-03-16 MED ORDER — POTASSIUM CHLORIDE CRYS ER 20 MEQ PO TBCR: 20.0000 meq | EXTENDED_RELEASE_TABLET | Freq: Every day | ORAL | Status: DC | PRN

## 2014-03-16 MED ORDER — GLYCOPYRROLATE 0.2 MG/ML IJ SOLN
INTRAMUSCULAR | Status: DC | PRN
  Administered 2014-03-16: 0.4 mg via INTRAVENOUS

## 2014-03-16 MED ORDER — LABETALOL HCL 5 MG/ML IV SOLN
INTRAVENOUS | Status: AC
  Administered 2014-03-16: 5 mg via INTRAVENOUS
  Filled 2014-03-16: qty 4

## 2014-03-16 MED ORDER — GLYCOPYRROLATE 0.2 MG/ML IJ SOLN
INTRAMUSCULAR | Status: AC
  Filled 2014-03-16: qty 3

## 2014-03-16 MED ORDER — NITROGLYCERIN 0.2 MG/ML ON CALL CATH LAB
INTRAVENOUS | Status: DC | PRN
  Administered 2014-03-16 (×4): 80 ug via INTRAVENOUS

## 2014-03-16 MED ORDER — ACETAMINOPHEN 650 MG RE SUPP: 325.0000 mg | RECTAL | Status: DC | PRN

## 2014-03-16 MED ORDER — HYDROMORPHONE HCL PF 1 MG/ML IJ SOLN
0.2500 mg | INTRAMUSCULAR | Status: DC | PRN
  Administered 2014-03-16 (×2): 0.5 mg via INTRAVENOUS

## 2014-03-16 SURGICAL SUPPLY — 50 items
BLADE SURG 10 STRL SS (BLADE) ×2 IMPLANT
CANISTER SUCTION 2500CC (MISCELLANEOUS) ×2 IMPLANT
CANNULA VESSEL 3MM 2 BLNT TIP (CANNULA) ×2 IMPLANT
CATH ROBINSON RED A/P 18FR (CATHETERS) ×2 IMPLANT
CLIP TI MEDIUM 6 (CLIP) ×2 IMPLANT
CLIP TI WIDE RED SMALL 6 (CLIP) ×2 IMPLANT
CRADLE DONUT ADULT HEAD (MISCELLANEOUS) ×2 IMPLANT
DECANTER SPIKE VIAL GLASS SM (MISCELLANEOUS) IMPLANT
DERMABOND ADVANCED (GAUZE/BANDAGES/DRESSINGS) ×1
DERMABOND ADVANCED .7 DNX12 (GAUZE/BANDAGES/DRESSINGS) ×1 IMPLANT
DRAIN HEMOVAC 1/8 X 5 (WOUND CARE) IMPLANT
ELECT REM PT RETURN 9FT ADLT (ELECTROSURGICAL) ×2
ELECTRODE REM PT RTRN 9FT ADLT (ELECTROSURGICAL) ×1 IMPLANT
EVACUATOR SILICONE 100CC (DRAIN) IMPLANT
GAUZE SPONGE 4X4 12PLY STRL (GAUZE/BANDAGES/DRESSINGS) ×2 IMPLANT
GEL ULTRASOUND 20GR AQUASONIC (MISCELLANEOUS) IMPLANT
GLOVE BIO SURGEON STRL SZ 6.5 (GLOVE) ×2 IMPLANT
GLOVE BIO SURGEON STRL SZ7.5 (GLOVE) ×2 IMPLANT
GLOVE BIOGEL PI IND STRL 6.5 (GLOVE) ×1 IMPLANT
GLOVE BIOGEL PI INDICATOR 6.5 (GLOVE) ×1
GLOVE ECLIPSE 6.5 STRL STRAW (GLOVE) ×2 IMPLANT
GLOVE SURG SS PI 6.5 STRL IVOR (GLOVE) ×4 IMPLANT
GOWN STRL REUS W/ TWL LRG LVL3 (GOWN DISPOSABLE) ×3 IMPLANT
GOWN STRL REUS W/ TWL XL LVL3 (GOWN DISPOSABLE) ×1 IMPLANT
GOWN STRL REUS W/TWL LRG LVL3 (GOWN DISPOSABLE) ×3
GOWN STRL REUS W/TWL XL LVL3 (GOWN DISPOSABLE) ×1
KIT BASIN OR (CUSTOM PROCEDURE TRAY) ×2 IMPLANT
KIT ROOM TURNOVER OR (KITS) ×2 IMPLANT
LOOP VESSEL MINI RED (MISCELLANEOUS) IMPLANT
NEEDLE HYPO 25GX1X1/2 BEV (NEEDLE) IMPLANT
NS IRRIG 1000ML POUR BTL (IV SOLUTION) ×4 IMPLANT
PACK CAROTID (CUSTOM PROCEDURE TRAY) ×2 IMPLANT
PAD ARMBOARD 7.5X6 YLW CONV (MISCELLANEOUS) ×4 IMPLANT
PATCH HEMASHIELD 8X75 (Vascular Products) ×2 IMPLANT
PROBE PENCIL 8 MHZ STRL DISP (MISCELLANEOUS) ×2 IMPLANT
SHUNT CAROTID BYPASS 10 (VASCULAR PRODUCTS) ×2 IMPLANT
SHUNT CAROTID BYPASS 12FRX15.5 (VASCULAR PRODUCTS) IMPLANT
SPONGE INTESTINAL PEANUT (DISPOSABLE) ×2 IMPLANT
SPONGE SURGIFOAM ABS GEL 100 (HEMOSTASIS) ×2 IMPLANT
SUT ETHILON 3 0 PS 1 (SUTURE) IMPLANT
SUT PROLENE 6 0 CC (SUTURE) ×4 IMPLANT
SUT PROLENE 7 0 BV 1 (SUTURE) IMPLANT
SUT PROLENE 7 0 BV1 MDA (SUTURE) ×2 IMPLANT
SUT SILK 3 0 TIES 17X18 (SUTURE)
SUT SILK 3-0 18XBRD TIE BLK (SUTURE) IMPLANT
SUT VIC AB 3-0 SH 27 (SUTURE) ×1
SUT VIC AB 3-0 SH 27X BRD (SUTURE) ×1 IMPLANT
SUT VICRYL 4-0 PS2 18IN ABS (SUTURE) ×2 IMPLANT
SYR CONTROL 10ML LL (SYRINGE) IMPLANT
WATER STERILE IRR 1000ML POUR (IV SOLUTION) ×2 IMPLANT

## 2014-03-16 NOTE — Transfer of Care (Signed)
Immediate Anesthesia Transfer of Care Note  Patient: Wesley [name]  Procedure(s) Performed: Procedure(s): LEFT CAROTID ENDARTERECTOMY WITH PATCH ANGIOPLASTY  (Left)  Patient Location: PACU  Anesthesia Type:General  Level of Consciousness: awake, patient cooperative and lethargic  Airway & Oxygen Therapy: Patient Spontanous Breathing and Patient connected to nasal cannula oxygen  Post-op Assessment: Report given to PACU RN, Post -op Vital signs reviewed and stable and Patient moving all extremities X 4  Post vital signs: Reviewed and stable  Complications: No apparent anesthesia complications

## 2014-03-16 NOTE — Interval H&P Note (Signed)
History and Physical Interval Note:  03/16/2014 7:25 AM  Wesley [name]  has presented today for surgery, with the diagnosis of Occlusion and stenosis of carotid artery with cerebral infarction  The various methods of treatment have been discussed with the patient and family. After consideration of risks, benefits and other options for treatment, the patient has consented to  Procedure(s): ENDARTERECTOMY CAROTID (Left) as a surgical intervention .  The patient's history has been reviewed, patient examined, no change in status, stable for surgery.  I have reviewed the patient's chart and labs.  Questions were answered to the patient's satisfaction.     FIELDS,CHARLES E

## 2014-03-16 NOTE — Progress Notes (Signed)
PA Lennie Muckle here and aware bp 170's / 90 after 15 mg total IV Labetelol. She fully updated Dr Oneida Alar by phone. Will give IV Hydralazine , Bp 160's OK. Will cont to monitor.

## 2014-03-16 NOTE — Anesthesia Procedure Notes (Signed)
Procedure Name: Intubation Date/Time: 03/16/2014 7:44 AM Performed by: Ned Grace Pre-anesthesia Checklist: Patient identified, Patient being monitored, Emergency Drugs available, Timeout performed and Suction available Patient Re-evaluated:Patient Re-evaluated prior to inductionOxygen Delivery Method: Circle system utilized Preoxygenation: Pre-oxygenation with 100% oxygen Intubation Type: IV induction, Rapid sequence and Cricoid Pressure applied Laryngoscope Size: Mac and 4 Grade View: Grade II Tube type: Oral Tube size: 7.5 mm Number of attempts: 1 Airway Equipment and Method: Stylet and LTA kit utilized Placement Confirmation: ETT inserted through vocal cords under direct vision,  breath sounds checked- equal and bilateral and positive ETCO2 Secured at: 23 cm Tube secured with: Tape Dental Injury: Teeth and Oropharynx as per pre-operative assessment  Comments: RSI d/t orange juice consumption at 0430 today.

## 2014-03-16 NOTE — Anesthesia Preprocedure Evaluation (Addendum)
Anesthesia Evaluation  Patient identified by MRN, date of birth, ID band Patient awake    Reviewed: Allergy & Precautions, H&P , NPO status , Patient's Chart, lab work & pertinent test results, reviewed documented beta blocker date and time   Airway Mallampati: III TM Distance: >3 FB Neck ROM: Full    Dental no notable dental hx. (+) Teeth Intact, Dental Advisory Given   Pulmonary neg pulmonary ROS, former smoker,  breath sounds clear to auscultation  Pulmonary exam normal       Cardiovascular hypertension, On Medications and On Home Beta Blockers + CAD and + Peripheral Vascular Disease + dysrhythmias Atrial Fibrillation Rhythm:Regular Rate:Normal     Neuro/Psych Seizures -, Well Controlled,  Schizophrenia CVA, Residual Symptoms negative psych ROS   GI/Hepatic negative GI ROS, Neg liver ROS,   Endo/Other  negative endocrine ROS  Renal/GU negative Renal ROS  negative genitourinary   Musculoskeletal   Abdominal   Peds  Hematology negative hematology ROS (+)   Anesthesia Other Findings   Reproductive/Obstetrics negative OB ROS                           Anesthesia Physical Anesthesia Plan  ASA: III  Anesthesia Plan: General   Post-op Pain Management:    Induction: Intravenous  Airway Management Planned: Oral ETT  Additional Equipment: Arterial line  Intra-op Plan:   Post-operative Plan: Extubation in OR and Possible Post-op intubation/ventilation  Informed Consent: I have reviewed the patients History and Physical, chart, labs and discussed the procedure including the risks, benefits and alternatives for the proposed anesthesia with the patient or authorized representative who has indicated his/her understanding and acceptance.   Dental advisory given  Plan Discussed with: CRNA  Anesthesia Plan Comments:         Anesthesia Quick Evaluation

## 2014-03-16 NOTE — Progress Notes (Addendum)
      Patient in PACU comfortable, palpable radial/brachial pulses.  No facial droop and no tongue deviation.  He has had hypertension post-op 155-181/91-111 cuff on right arm.  Cuff on left arm 168/84 which matches  A line reading 169/84.  He has received 15 mg since surgery.  Stable.  Will give hydralazine   Gailya Tauer MAUREEN PA-C

## 2014-03-16 NOTE — Progress Notes (Signed)
Lunch relief by D. Walker RN 

## 2014-03-16 NOTE — Anesthesia Postprocedure Evaluation (Signed)
  Anesthesia Post-op Note  Patient: Wesley [name]  Procedure(s) Performed: Procedure(s): LEFT CAROTID ENDARTERECTOMY WITH PATCH ANGIOPLASTY  (Left)  Patient Location: PACU  Anesthesia Type:General  Level of Consciousness: awake and alert   Airway and Oxygen Therapy: Patient Spontanous Breathing  Post-op Pain: none  Post-op Assessment: Post-op Vital signs reviewed, Patient's Cardiovascular Status Stable and Respiratory Function Stable  Post-op Vital Signs: Reviewed  Filed Vitals:   03/16/14 1249  BP: 155/91  Pulse: 89  Temp:   Resp: 17    Complications: No apparent anesthesia complications

## 2014-03-16 NOTE — H&P (View-Only) (Signed)
[facility] [place] HISTORY AND PHYSICAL   Referring: Clayton Lefort, M.D. History of Present Illness:  Patient is a 68 y.o. year old male who presents for evaluation of asymptomatic high-grade carotid stenosis.  He underwent repair of infrarenal abdominal aortic aneurysm with a Gore Excluder device on July 6.  He denies abdominal or back pain. He denies recent symptoms of TIA amaurosis or stroke. He is a former smoker quit approximately 2 years ago to The patient lives with his son that overall performance his daily activities independently. He does have a history of abdominal aortic aneurysm in his sister. 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 He is on Savaysa for anticoagulation. This is for atrial fibrillation.    Past Medical History   Diagnosis  Date   .  AAA (abdominal aortic aneurysm)     .  Carotid artery occlusion     .  Stroke  04/16/1986   Atrial fibrillation    Past Surgical History   Procedure  Laterality  Date   .  Nerve block limb  Right       Social History History   Substance Use Topics   .  Smoking status:  Former Smoker -- 1.00 packs/day for 58 years       Types:  Cigarettes       Quit date:  12/29/2011   .  Smokeless tobacco:  Not on file   .  Alcohol Use:  No     Family History Family History   Problem  Relation  Age of Onset   .  Cancer  Father     .  Hyperlipidemia  Father     .  Hypertension  Father     .  Heart disease  Sister     .  AAA (abdominal aortic aneurysm)  Sister       Allergies  No Known Allergies     Current Outpatient Prescriptions on File Prior to Visit  Medication Sig Dispense Refill  . atenolol (TENORMIN) 100 MG tablet Take 1 tablet (100 mg total) by mouth 2 (two) times daily.  180 tablet  1  . cloNIDine (CATAPRES) 0.1 MG tablet  Take 0.1 mg by mouth daily.       . clopidogrel (PLAVIX) 75 MG tablet Take 1 tablet (75 mg total) by mouth daily with breakfast.  30 tablet  6  . Edoxaban Tosylate (SAVAYSA) 60 MG TABS Take 60 mg by mouth daily.      . haloperidol (HALDOL) 5 MG tablet Take 2.5 mg by mouth at bedtime.       . hydrALAZINE (APRESOLINE) 50 MG tablet Take 50 mg by mouth daily.      . isosorbide mononitrate (IMDUR) 60 MG 24 hr tablet Take 60 mg by mouth at bedtime.      Marland Kitchen oxyCODONE (ROXICODONE) 5 MG immediate release tablet Take 1 tablet (5 mg total) by mouth every 6 (six) hours as needed for severe pain.  15 tablet  0  . simvastatin (ZOCOR) 40 MG tablet Take 40 mg by mouth at bedtime.       . trihexyphenidyl (ARTANE) 2 MG tablet Take 2 mg by mouth daily.        No current facility-administered medications on file prior to visit.   ROS:    General:  No weight  loss, Fever, chills  Neurologic: No dizziness, blackouts, seizures. No recent symptoms of stroke or mini- stroke. No recent episodes of slurred speech, or temporary blindness.  Cardiac: No recent episodes of chest pain/pressure, no shortness of breath at rest.  + shortness of breath with exertion.  Denies history of atrial fibrillation or irregular heartbeat  Vascular: No history of rest pain in feet.  No history of claudication.  No history of non-healing ulcer, No history of DVT      Physical Examination     Filed Vitals:   03/04/14 1600 03/04/14 1602  BP: 104/71 112/68  Pulse: 82   Height: 6\' 1"  (1.854 m)   Weight: 192 lb (87.091 kg)   SpO2: 100%    General:  Alert and oriented, no acute distress HEENT: Normal Neck: No bruit or JVD Pulmonary: Clear to auscultation bilaterally Cardiac: Regular Rate and Rhythm without murmur Abdomen: Soft, non-tender, non-distended, no mass Skin: No rash Extremity Pulses:  2+ radial, brachial, femoral, dorsalis pedis, posterior tibial pulses bilaterally Musculoskeletal: No deformity or edema      Neurologic: Upper and lower extremity motor 5/5 and symmetric, tremor right hand  DATA:  patient had a carotid duplex scan today which showed a 60-80% right internal carotid artery stenosis and greater than 80% left internal carotid artery stenosis.  CT Angio of the head and neck is also reviewed. This shows a 75% right internal carotid artery stenosis and an 85% left internal carotid artery stenosis  CT scan of abdomen and pelvis is performed today post stent graft this shows a stent graft in good position with the proximal aspect just below the renal arteries. There is no endoleak. Aneurysm diameter is 5.8 cm.   ASSESSMENT:  #1 status post repair of abdominal aortic aneurysm needs  followup CT scan in 5 months #2 bilateral high grade internal carotid artery stenosis, asymptomatic left greater than right   PLAN:  needs left carotid endarterectomy for stroke prophylaxis. He will continue his Plavix. We will stop his Savaysa perioperatively. Left carotid endarterectomy scheduled for 03/16/2014. Risks benefits possible complications and procedure details including but not limited to cranial nerve injury bleeding infection stroke risk were explained the patient and his son today. They understand and agree to proceed.  Ruta Hinds, MD [facility] [place] Office: [phone] Pager: [phone]

## 2014-03-16 NOTE — Op Note (Signed)
Procedure Left carotid endarterectomy  Preoperative diagnosis: High-grade asymptomatic left internal carotid artery stenosis  Postoperative diagnosis: Same  Anesthesia General  Asst.: Silva Bandy, [facility]  Operative findings: #1 greater than 80% left internal carotid artery stenosis                                #2 Dacron patch distal tip underneath hypoglossal nerve  Operative details: After obtaining informed consent, the patient was taken to the operating room. The patient was placed in a supine position on the operating room table. After induction of general anesthesia and endotracheal intubation a Foley catheter was placed. Next the patient's entire neck and chest was prepped and draped in the usual sterile fashion. An oblique incision was made on the left aspect of the patient's neck anterior to the border the left sternocleidomastoid muscle. The incision was carried into the subcutaneous tissues and through the platysma. The sternocleidomastoid muscle was identified and reflected laterally. The omohyoid muscle was identified and this was divided with cautery. The common carotid artery was then found at the base of the incision this was dissected free circumferentially. The vagus nerve was identified and protected. Dissection was then carried up to the level carotid bifurcation. The Ansa cervicalis was identified and traced up to its insertion into the hypoglossal nerve.  The hyperglossal nerve was draped over the diseased portion of the internal carotid artery. The internal carotid artery was dissected free circumferentially just above the level of the hypoglossal nerve and it was soft in character at this location and above any palpable disease. A vessel loop was placed around this. Next the external carotid and superior thyroid arteries were dissected free circumferentially and vessel loops were placed around these. The patient was given 8000 units of intravenous heparin. He was given an additional  3000 units of heparin during the course of the case.  After 2 minutes of circulation time and raising the mean arterial pressure to 90 mm mercury, the distal internal carotid artery was controlled with small bulldog clamp. The external carotid and superior thyroid arteries were controlled with vessel loops. The common carotid artery was controlled with a peripheral DeBakey clamp. A longitudinal opening was made in the common carotid artery just below the bifurcation. The arteriotomy was extended distally up into the internal carotid with Potts scissors. There was a large calcified plaque with greater than 90% stenosis in the internal carotid.    The arteriotomy was extended past the level of stenosis.  A 10 Fr shunt was then threaded into the distal internal carotid artery and allowed to back bleed thoroughly.  This was then placed down into the common carotid artery and secured with a Rummel tourniquet.  There was some backbleeding around the shunt distally which was controlled with a shunt clamp.  Attention was then turned to the common carotid artery once again. A suitable endarterectomy plane was obtained and endarterectomy was begun in the common carotid artery and a reasonable proximal endpoint was obtained. An eversion endarterectomy was performed on the external carotid artery and a good endpoint was obtained. The plaque was then elevated in the internal carotid artery and a nice feathered distal endpoint was also obtained. However the endpoint was lifting slightly so it was tacked posteriorly with several 7 0 Prolene sutures.  The plaque was passed off the table as a specimen. All loose debris was then removed from the carotid bed and everything was  thoroughly irrigated with heparinized saline. A Dacron patch was then brought on to the operative field and this was sewn on as a patch angioplasty using a running 6-0 Prolene suture. Prior to completion of the anastomosis the internal carotid artery was  thoroughly backbled. This was then controlled again with a small bulldog clamp.  The common carotid was thoroughly flushed forward. The external carotid was also thoroughly backbled.  The remainder of the patch was completed and the anastomosis was secured. Flow was then restored first retrograde from the external carotid into the carotid bed then antegrade from the common carotid to the external carotid artery and after approximately 5 cardiac cycles to the internal carotid artery. Doppler was used to evaluate the external/internal and common carotid arteries and these all had good Doppler flow. Hemostasis was obtained with 3 additional repair sutures at the distal end of the patch as well as thrombin and gelfoam. The patient was also given 110 mg of Protamine.    At conclusion of the case the thrombin and gelfoam were removed from the neck incision.  The platysma muscle was reapproximated using a running 3-0 Vicryl suture. The skin was closed with 4 0 Vicryl subcuticular stitch.  The patient was awakened in the operating room and was moving upper and lower extremities symmetrically and following commands.  The patient was stable on arrival to the PACU.  Ruta Hinds, MD [facility] [place] Office: [phone] Pager: [phone]

## 2014-03-16 NOTE — Progress Notes (Signed)
Notified dr. Tamala Julian of patient having 6oz of orange juice at 0430 this morning.

## 2014-03-17 ENCOUNTER — Telehealth: Payer: Self-pay | Admitting: Vascular Surgery

## 2014-03-17 ENCOUNTER — Encounter (HOSPITAL_COMMUNITY): Payer: Self-pay | Admitting: Vascular Surgery

## 2014-03-17 LAB — BASIC METABOLIC PANEL
ANION GAP: 15 (ref 5–15)
BUN: 16 mg/dL (ref 6–23)
CO2: 21 meq/L (ref 19–32)
Calcium: 9.5 mg/dL (ref 8.4–10.5)
Chloride: 104 mEq/L (ref 96–112)
Creatinine, Ser: 0.97 mg/dL (ref 0.50–1.35)
GFR calc Af Amer: >90 mL/min (ref >90)
GFR calc non Af Amer: 82 mL/min — ABNORMAL LOW (ref >90)
Glucose, Bld: 133 mg/dL — ABNORMAL HIGH (ref 70–99)
Potassium: 4.5 mEq/L (ref 3.7–5.3)
Sodium: 140 mEq/L (ref 137–147)

## 2014-03-17 LAB — CBC
HCT: 35.1 % — ABNORMAL LOW (ref 39.0–52.0)
Hemoglobin: 12 g/dL — ABNORMAL LOW (ref 13.0–17.0)
MCH: 29.3 pg (ref 26.0–34.0)
MCHC: 34.2 g/dL (ref 30.0–36.0)
MCV: 85.6 fL (ref 78.0–100.0)
PLATELETS: 221 10*3/uL (ref 150–400)
RBC: 4.1 MIL/uL — AB (ref 4.22–5.81)
RDW: 14.8 % (ref 11.5–15.5)
WBC: 14.2 10*3/uL — ABNORMAL HIGH (ref 4.0–10.5)

## 2014-03-17 LAB — PROTIME-INR
INR: 1.2 (ref 0.00–1.49)
Prothrombin Time: 15.2 seconds (ref 11.6–15.2)

## 2014-03-17 MED ORDER — OXYCODONE HCL 5 MG PO TABS: 5.0000 mg | ORAL_TABLET | Freq: Four times a day (QID) | ORAL | Status: DC | PRN

## 2014-03-17 NOTE — Progress Notes (Signed)
  [facility] Progress Note  03/17/2014 8:42 AM 1 Day Post-Op  Subjective:  Doing well this am. Sitting in the chair eating breakfast. No difficulty swallowing. No complaints.   Tmax 99.7 BP sys 130s-200s; currently 164/92 02 100% RA  Filed Vitals:   03/17/14 0740  BP:   Pulse:   Temp: 98.4 F (36.9 C)  Resp:      Physical Exam: Neuro:  No smile asymmetry or tongue deviation. Left hand grip 5/5, slightly weaker right hand grip from chronic RUE weakness. 5/5 strength lower extremities bilaterally. Vascular:  2+ radial pulses bilaterally Incision:  Left neck incision c/d/i. No hematoma.    CBC    Component Value Date/Time   WBC 14.2* 03/17/2014 0430   RBC 4.10* 03/17/2014 0430   HGB 12.0* 03/17/2014 0430   HCT 35.1* 03/17/2014 0430   PLT 221 03/17/2014 0430   MCV 85.6 03/17/2014 0430   MCH 29.3 03/17/2014 0430   MCHC 34.2 03/17/2014 0430   RDW 14.8 03/17/2014 0430    BMET    Component Value Date/Time   NA 140 03/17/2014 0430   K 4.5 03/17/2014 0430   CL 104 03/17/2014 0430   CO2 21 03/17/2014 0430   GLUCOSE 133* 03/17/2014 0430   BUN 16 03/17/2014 0430   CREATININE 0.97 03/17/2014 0430   CALCIUM 9.5 03/17/2014 0430   GFRNONAA 82* 03/17/2014 0430   GFRAA >90 03/17/2014 0430     Intake/Output Summary (Last 24 hours) at 03/17/14 0842 Last data filed at 03/17/14 0740  Gross per 24 hour  Intake   2990 ml  Output   1400 ml  Net   1590 ml      Assessment/Plan:  This is a 68 y.o. male who is s/p left CEA 1 Day Post-Op  -Patient is doing well this am. -Neuro exam is intact -Blood pressure stable today with systolic in 664Q after 10 mg labetolol given at 0751 -Has not ambulated; please mbulate this morning in halls.  -Has voided. -Resume Savaysa for afib.  -Discharge home today. Follow-up with Dr. Oneida Alar in 2 weeks.    Virgina Jock, PA-C [facility] Office: [phone] Pager: [phone] 03/17/2014 8:42 AM  Ambulating in  halls Neuro at baseline No hematoma Will d/c home  Ruta Hinds, MD [facility] [place] Office: [phone] Pager: [phone]

## 2014-03-17 NOTE — Telephone Encounter (Addendum)
Message copied by Gena Fray on Wed Mar 17, 2014 12:59 PM ------      Message from: Peter Minium K      Created: Wed Mar 17, 2014  9:24 AM      Regarding: Schedule                   ----- Message -----         From: Alvia Grove, PA-C         Sent: 03/17/2014   8:49 AM           To: [facility] Charge Pool            S/p left CEA 03/16/14            F/u with Dr. Oneida Alar in 2 weeks.            Thanks,       Kim ------  03/17/14: spoke with pts son to schedule post op, dpm

## 2014-03-17 NOTE — Care Management Note (Signed)
    Page 1 of 1   03/17/2014     10:50:04 AM CARE MANAGEMENT NOTE 03/17/2014  Patient:  Wesley [name], Wesley [name]   Account Number:  [id number]  Date Initiated:  03/17/2014  Documentation initiated by:  Marvetta Gibbons  Subjective/Objective Assessment:   Pt admitted s/p carotid     Action/Plan:   PTA pt lived at home- anticipate return home   Anticipated DC Date:  03/17/2014   Anticipated DC Plan:  HOME/SELF CARE         Choice offered to / List presented to:             Status of service:  Completed, signed off Medicare Important Message given?  NA - LOS <3 / Initial given by admissions (If response is "NO", the following Medicare IM given date fields will be blank) Date Medicare IM given:   Medicare IM given by:   Date Additional Medicare IM given:   Additional Medicare IM given by:    Discharge Disposition:  HOME/SELF CARE  Per UR Regulation:  Reviewed for med. necessity/level of care/duration of stay  If discussed at [place] of Stay Meetings, dates discussed:    Comments:

## 2014-03-17 NOTE — Progress Notes (Signed)
Utilization review completed.  

## 2014-03-18 NOTE — Discharge Summary (Signed)
[facility] Discharge Summary  Wesley [name] July 12, 1946 68 y.o. male  [id number]  Admission Date: 03/16/2014  Discharge Date: 03/17/2014  Physician: Ruta Hinds, MD  Admission Diagnosis: Occlusion and stenosis of carotid artery with cerebral infarction   HPI:   This is a 68 y.o. male who presented to the [facility] office for evaluation of asymptomatic high-grade carotid stenosis. He underwent repair of infrarenal abdominal aortic aneurysm with a Gore Excluder device on July 6. He denies abdominal or back pain. He denies recent symptoms of TIA amaurosis or stroke. He is a former smoker quit approximately 2 years ago to The patient lives with his son that overall performance his daily activities independently. He does have a history of abdominal aortic aneurysm in his sister. 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 He is on Savaysa for anticoagulation. This is for atrial fibrillation   Hospital Course:  The patient was admitted to the hospital and taken to the operating room on 03/16/2014 and underwent left carotid endarterectomy.  The patient tolerated the procedure well and was transported to the PACU in stable condition. In the PACU his blood pressure was in the 170s/90s after 15mg  total of IV labetalol was given. She required additional IV hydralazine and his blood pressure remained in the 160s. He was neurologically intact the remainder of the day of surgery.   By POD 1, the patient's neuro status remained intact. He was ambulating in the halls without difficulty and swallowing solids without difficulty. He had no hematoma of his neck. His blood pressure remained stable. He was discharged home on POD 1 in good condition.     Recent Labs  03/17/14 0430  NA 140  K 4.5  CL 104  CO2  21  GLUCOSE 133*  BUN 16  CALCIUM 9.5    Recent Labs  03/17/14 0430  WBC 14.2*  HGB 12.0*  HCT 35.1*  PLT 221    Recent Labs  03/16/14 0603 03/17/14 0430  INR 1.09 1.20    Discharge Instructions:   The patient is discharged to home with extensive instructions on wound care and progressive ambulation.  They are instructed not to drive or perform any heavy lifting until returning to see the physician in his office.  Discharge Instructions   CAROTID Sugery: Call MD for difficulty swallowing or speaking; weakness in arms or legs that is a new symtom; severe headache.  If you have increased swelling in the neck and/or  are having difficulty breathing, CALL [phone]    Complete by:  As directed      Call MD for:  redness, tenderness, or signs of infection (pain, swelling, bleeding, redness, odor or green/yellow discharge around incision site)    Complete by:  As directed      Call MD for:  severe or increased pain, loss or decreased feeling  in affected limb(s)    Complete by:  As directed      Call MD for:  temperature >100.5    Complete by:  As directed      Driving Restrictions    Complete by:  As directed   No driving for 2 weeks     Lifting restrictions    Complete by:  As directed   No lifting for 2 weeks     Resume previous diet    Complete by:  As directed      may wash over wound with mild soap and water    Complete by:  As directed            Discharge Diagnosis:  Occlusion and stenosis of carotid artery with cerebral infarction  Secondary Diagnosis: Patient Active Problem List   Diagnosis Date Noted  . Carotid aneurysm, left 03/16/2014  . AAA (abdominal aortic aneurysm) 02/01/2014  . S/P PTCA (percutaneous transluminal coronary angioplasty) 01/19/2014  . AAA (abdominal aortic aneurysm) without rupture 12/31/2013  . Occlusion and stenosis of carotid artery without mention of cerebral infarction 12/31/2013   Past Medical History  Diagnosis Date  . AAA  (abdominal aortic aneurysm)   . Carotid artery occlusion   . Coronary artery disease   . Arthritis     "legs" (01/19/2014)  . Chronic back pain     "neck to tailbone" (01/19/2014)  . Hyperlipidemia     takes Zocor daily  . History of bronchitis 81yrs ago  . Seizures     has had one several yrs ago but never been on meds  . Stroke 04/16/1986    takes Plavix daily as well as Savaysa;right sided weakness  . Back pain     reason unknown  . Dysphagia   . History of colon polyps   . History of blood transfusion   . Schizophrenia     takes Haldol daily  . Dysrhythmia     Atrial Fib-takes Atenolol daily  . Hypertension     takes Clonidine,Apresoline,Imdur,and Lisinopril daily      Medication List         atenolol 100 MG tablet  Commonly known as:  TENORMIN  Take 1 tablet (100 mg total) by mouth 2 (two) times daily.     cloNIDine 0.1 MG tablet  Commonly known as:  CATAPRES  Take 0.1 mg by mouth daily.     clopidogrel 75 MG tablet  Commonly known as:  PLAVIX  Take 1 tablet (75 mg total) by mouth daily with breakfast.     haloperidol 5 MG tablet  Commonly known as:  HALDOL  Take 2.5 mg by mouth at bedtime.     hydrALAZINE 50 MG tablet  Commonly known as:  APRESOLINE  Take 50 mg by mouth daily.     isosorbide mononitrate 60 MG 24 hr tablet  Commonly known as:  IMDUR  Take 60 mg by mouth at bedtime.     lisinopril 10 MG tablet  Commonly known as:  PRINIVIL,ZESTRIL  Take 10 mg by mouth daily.     oxyCODONE 5 MG immediate release tablet  Commonly known as:  Oxy IR/ROXICODONE  Take 1 tablet (5 mg total) by mouth every 6 (six) hours as needed for severe pain.     SAVAYSA 60 MG Tabs tablet  Generic drug:  edoxaban  Take 60 mg by mouth daily.     simvastatin 40 MG tablet  Commonly known as:  ZOCOR  Take 40 mg by mouth at bedtime.     trihexyphenidyl 2 MG tablet  Commonly known as:  ARTANE  Take 2 mg by mouth daily.        Roxicodone #20 No  Refill  Disposition: Home  Patient's condition: is Good  Follow up: 1. Dr.  Oneida Alar in 2 weeks.   Virgina Jock, PA-C [facility] [phone]  --- For [facility] use --- Instructions: Press F2 to tab through selections.  Delete question if not applicable.  Modified Rankin score at D/C (0-6): 0  IV medication needed for:  1. Hypertension: Yes 2. Hypotension: No  Post-op Complications: No  1. Post-op CVA or TIA: No  2. CN injury: No  3. Myocardial infarction: No  4.  CHF: No  5.  Dysrhythmia (new): No  6. Wound infection: No  7. Reperfusion symptoms: No  8. Return to OR: No   Discharge medications: Statin use:  Yes If No: [ ]  For Medical reasons, [ ]  Non-compliant, [ ]  Not-indicated ASA use:  No  If No: [ ]  For Medical reasons, [ ]  Non-compliant, [x ] Not-indicated, on plavix Beta blocker use:  Yes If No: [ ]  For Medical reasons, [ ]  Non-compliant, [ ]  Not-indicated ACE-Inhibitor use:  Yes If No: [ ]  For Medical reasons, [ ]  Non-compliant, [ ]  Not-indicated P2Y12 Antagonist use: Yes, [ x] Plavix, [ ]  Plasugrel, [ ]  Ticlopinine, [ ]  Ticagrelor, [ ]  Other, [ ]  No for medical reason, [ ]  Non-compliant, [ ]  Not-indicated Anti-coagulant use:  Yes, [ ]  Warfarin, [ ]  Rivaroxaban, [ ]  Dabigatran, [x ] Other, edoxaban [ ]  No for medical reason, [ ]  Non-compliant, [ ]  Not-indicated

## 2014-03-26 DIAGNOSIS — R7309 Other abnormal glucose: Secondary | ICD-10-CM | POA: Diagnosis not present

## 2014-03-26 DIAGNOSIS — I10 Essential (primary) hypertension: Secondary | ICD-10-CM | POA: Diagnosis not present

## 2014-03-26 DIAGNOSIS — E785 Hyperlipidemia, unspecified: Secondary | ICD-10-CM | POA: Diagnosis not present

## 2014-03-26 DIAGNOSIS — F0390 Unspecified dementia without behavioral disturbance: Secondary | ICD-10-CM | POA: Diagnosis not present

## 2014-03-31 ENCOUNTER — Encounter: Payer: Self-pay | Admitting: Vascular Surgery

## 2014-04-01 ENCOUNTER — Encounter: Payer: Self-pay | Admitting: Vascular Surgery

## 2014-04-01 ENCOUNTER — Ambulatory Visit (INDEPENDENT_AMBULATORY_CARE_PROVIDER_SITE_OTHER): Payer: Medicare Other | Admitting: Vascular Surgery

## 2014-04-01 VITALS — BP 134/76 | HR 89 | Temp 98.2°F | Resp 16 | Ht 73.0 in | Wt 189.0 lb

## 2014-04-01 DIAGNOSIS — I6529 Occlusion and stenosis of unspecified carotid artery: Secondary | ICD-10-CM

## 2014-04-01 DIAGNOSIS — I714 Abdominal aortic aneurysm, without rupture, unspecified: Secondary | ICD-10-CM

## 2014-04-01 DIAGNOSIS — Z48812 Encounter for surgical aftercare following surgery on the circulatory system: Secondary | ICD-10-CM

## 2014-04-01 NOTE — Addendum Note (Signed)
Addended by: Mena Goes on: 04/01/2014 03:09 PM   Modules accepted: Orders
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
## 2014-04-19 DIAGNOSIS — F205 Residual schizophrenia: Secondary | ICD-10-CM | POA: Diagnosis not present

## 2014-05-12 ENCOUNTER — Telehealth (HOSPITAL_COMMUNITY): Payer: Self-pay | Admitting: Cardiac Rehabilitation

## 2014-05-12 NOTE — Telephone Encounter (Signed)
pc to pt to discuss enrolling in cardiac rehab.  Pt son and pt declined.  Pt is recovering from vascular surgeries performed since his stent placement. He is not interested at this time. Dr. Einar Gip made aware.

## 2014-05-17 ENCOUNTER — Ambulatory Visit (INDEPENDENT_AMBULATORY_CARE_PROVIDER_SITE_OTHER): Payer: Medicare Other | Admitting: Podiatry

## 2014-05-17 DIAGNOSIS — M79673 Pain in unspecified foot: Secondary | ICD-10-CM

## 2014-05-17 DIAGNOSIS — B351 Tinea unguium: Secondary | ICD-10-CM | POA: Diagnosis not present

## 2014-05-17 NOTE — Progress Notes (Signed)
Subjective:     Patient ID: Wesley [name], male   DOB: 08/10/1945, 68 y.o.   MRN: [id number]  HPI patient presents with thick painful yellow nailbeds 1-5 both feet that he cannot cut   Review of Systems     Objective:   Physical Exam Neurovascular status intact with thick yellow brittle nailbeds 1-5 of both feet    Assessment:     Painful mycotic nail infection is 1-5 both feet    Plan:     Debris painful nailbeds 1-5 both feet with no iatrogenic bleeding noted      

## 2014-05-17 NOTE — Progress Notes (Signed)
   Subjective:    Patient ID: Wesley [name], male    DOB: 1945/08/30, 68 y.o.   MRN: [id number]  HPI Pt presents for nail debirdement   Review of Systems     Objective:   Physical Exam        Assessment & Plan:

## 2014-06-08 ENCOUNTER — Emergency Department (HOSPITAL_COMMUNITY): Payer: Medicare Other

## 2014-06-08 ENCOUNTER — Emergency Department (HOSPITAL_COMMUNITY)
Admission: EM | Admit: 2014-06-08 | Discharge: 2014-06-09 | Disposition: A | Discharge status: Home / Self Care | Payer: Medicare Other | Attending: Emergency Medicine | Admitting: Emergency Medicine

## 2014-06-08 ENCOUNTER — Encounter (HOSPITAL_COMMUNITY): Payer: Self-pay | Admitting: Emergency Medicine

## 2014-06-08 DIAGNOSIS — I10 Essential (primary) hypertension: Secondary | ICD-10-CM | POA: Insufficient documentation

## 2014-06-08 DIAGNOSIS — S3992XA Unspecified injury of lower back, initial encounter: Secondary | ICD-10-CM | POA: Diagnosis not present

## 2014-06-08 DIAGNOSIS — Y998 Other external cause status: Secondary | ICD-10-CM | POA: Insufficient documentation

## 2014-06-08 DIAGNOSIS — Z86010 Personal history of colonic polyps: Secondary | ICD-10-CM | POA: Insufficient documentation

## 2014-06-08 DIAGNOSIS — M549 Dorsalgia, unspecified: Secondary | ICD-10-CM | POA: Diagnosis not present

## 2014-06-08 DIAGNOSIS — Z8669 Personal history of other diseases of the nervous system and sense organs: Secondary | ICD-10-CM | POA: Insufficient documentation

## 2014-06-08 DIAGNOSIS — Y9389 Activity, other specified: Secondary | ICD-10-CM | POA: Insufficient documentation

## 2014-06-08 DIAGNOSIS — Z8709 Personal history of other diseases of the respiratory system: Secondary | ICD-10-CM | POA: Insufficient documentation

## 2014-06-08 DIAGNOSIS — Z955 Presence of coronary angioplasty implant and graft: Secondary | ICD-10-CM | POA: Insufficient documentation

## 2014-06-08 DIAGNOSIS — Z7901 Long term (current) use of anticoagulants: Secondary | ICD-10-CM | POA: Insufficient documentation

## 2014-06-08 DIAGNOSIS — Z87891 Personal history of nicotine dependence: Secondary | ICD-10-CM | POA: Insufficient documentation

## 2014-06-08 DIAGNOSIS — I4891 Unspecified atrial fibrillation: Secondary | ICD-10-CM | POA: Diagnosis not present

## 2014-06-08 DIAGNOSIS — I2510 Atherosclerotic heart disease of native coronary artery without angina pectoris: Secondary | ICD-10-CM | POA: Insufficient documentation

## 2014-06-08 DIAGNOSIS — Y92410 Unspecified street and highway as the place of occurrence of the external cause: Secondary | ICD-10-CM | POA: Insufficient documentation

## 2014-06-08 DIAGNOSIS — Z8619 Personal history of other infectious and parasitic diseases: Secondary | ICD-10-CM | POA: Diagnosis not present

## 2014-06-08 DIAGNOSIS — M546 Pain in thoracic spine: Secondary | ICD-10-CM

## 2014-06-08 DIAGNOSIS — F209 Schizophrenia, unspecified: Secondary | ICD-10-CM | POA: Diagnosis not present

## 2014-06-08 DIAGNOSIS — Z7902 Long term (current) use of antithrombotics/antiplatelets: Secondary | ICD-10-CM | POA: Insufficient documentation

## 2014-06-08 DIAGNOSIS — Z8673 Personal history of transient ischemic attack (TIA), and cerebral infarction without residual deficits: Secondary | ICD-10-CM | POA: Diagnosis not present

## 2014-06-08 DIAGNOSIS — M1990 Unspecified osteoarthritis, unspecified site: Secondary | ICD-10-CM | POA: Insufficient documentation

## 2014-06-08 DIAGNOSIS — E785 Hyperlipidemia, unspecified: Secondary | ICD-10-CM | POA: Insufficient documentation

## 2014-06-08 DIAGNOSIS — M545 Low back pain, unspecified: Secondary | ICD-10-CM

## 2014-06-08 DIAGNOSIS — Z79899 Other long term (current) drug therapy: Secondary | ICD-10-CM | POA: Insufficient documentation

## 2014-06-08 MED ORDER — OXYCODONE-ACETAMINOPHEN 5-325 MG PO TABS
1.0000 | ORAL_TABLET | Freq: Once | ORAL | Status: AC
  Administered 2014-06-08: 1 via ORAL
  Filled 2014-06-08: qty 1
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
## 2014-06-09 MED ORDER — OXYCODONE-ACETAMINOPHEN 5-325 MG PO TABS: 2.0000 | ORAL_TABLET | ORAL | Status: DC | PRN

## 2014-06-09 NOTE — ED Notes (Signed)
Pt. Refused wheelchair 

## 2014-06-09 NOTE — Discharge Instructions (Signed)

## 2014-06-10 DIAGNOSIS — I482 Chronic atrial fibrillation: Secondary | ICD-10-CM | POA: Diagnosis not present

## 2014-06-10 DIAGNOSIS — Z95828 Presence of other vascular implants and grafts: Secondary | ICD-10-CM | POA: Diagnosis not present

## 2014-06-10 DIAGNOSIS — I10 Essential (primary) hypertension: Secondary | ICD-10-CM | POA: Diagnosis not present

## 2014-06-10 DIAGNOSIS — Z9889 Other specified postprocedural states: Secondary | ICD-10-CM | POA: Diagnosis not present

## 2014-06-14 DIAGNOSIS — R7301 Impaired fasting glucose: Secondary | ICD-10-CM | POA: Diagnosis not present

## 2014-06-14 DIAGNOSIS — I10 Essential (primary) hypertension: Secondary | ICD-10-CM | POA: Diagnosis not present

## 2014-06-14 DIAGNOSIS — S335XXA Sprain of ligaments of lumbar spine, initial encounter: Secondary | ICD-10-CM | POA: Diagnosis not present

## 2014-06-28 DIAGNOSIS — I10 Essential (primary) hypertension: Secondary | ICD-10-CM | POA: Diagnosis not present

## 2014-07-05 DIAGNOSIS — I10 Essential (primary) hypertension: Secondary | ICD-10-CM | POA: Diagnosis not present

## 2014-07-05 DIAGNOSIS — R7301 Impaired fasting glucose: Secondary | ICD-10-CM | POA: Diagnosis not present

## 2014-07-05 DIAGNOSIS — G20 Parkinson's disease: Secondary | ICD-10-CM | POA: Diagnosis not present

## 2014-07-05 DIAGNOSIS — Z23 Encounter for immunization: Secondary | ICD-10-CM | POA: Diagnosis not present

## 2014-07-08 ENCOUNTER — Encounter (HOSPITAL_COMMUNITY): Payer: Self-pay | Admitting: Cardiology

## 2014-08-11 DIAGNOSIS — I25119 Atherosclerotic heart disease of native coronary artery with unspecified angina pectoris: Secondary | ICD-10-CM | POA: Diagnosis not present

## 2014-08-11 DIAGNOSIS — Z9889 Other specified postprocedural states: Secondary | ICD-10-CM | POA: Diagnosis not present

## 2014-08-11 DIAGNOSIS — I482 Chronic atrial fibrillation: Secondary | ICD-10-CM | POA: Diagnosis not present

## 2014-08-11 DIAGNOSIS — Z95828 Presence of other vascular implants and grafts: Secondary | ICD-10-CM | POA: Diagnosis not present

## 2014-08-23 ENCOUNTER — Ambulatory Visit (INDEPENDENT_AMBULATORY_CARE_PROVIDER_SITE_OTHER): Payer: Medicare Other | Admitting: Podiatry

## 2014-08-23 DIAGNOSIS — M79673 Pain in unspecified foot: Secondary | ICD-10-CM

## 2014-08-23 DIAGNOSIS — F205 Residual schizophrenia: Secondary | ICD-10-CM | POA: Diagnosis not present

## 2014-08-23 DIAGNOSIS — B351 Tinea unguium: Secondary | ICD-10-CM

## 2014-08-23 NOTE — Progress Notes (Signed)
Subjective:     Patient ID: Wesley [name], male   DOB: Mar 29, 1946, 69 y.o.   MRN: [id number]  HPI patient presents with thick painful yellow nailbeds 1-5 both feet that he cannot cut   Review of Systems     Objective:   Physical Exam Neurovascular status intact with thick yellow brittle nailbeds 1-5 of both feet    Assessment:     Painful mycotic nail infection is 1-5 both feet    Plan:     Debris painful nailbeds 1-5 both feet with no iatrogenic bleeding noted

## 2014-08-31 ENCOUNTER — Encounter (HOSPITAL_COMMUNITY): Payer: Self-pay | Admitting: Emergency Medicine

## 2014-08-31 ENCOUNTER — Emergency Department (HOSPITAL_COMMUNITY)
Admission: EM | Admit: 2014-08-31 | Discharge: 2014-08-31 | Disposition: A | Discharge status: Home / Self Care | Payer: Medicare Other | Attending: Emergency Medicine | Admitting: Emergency Medicine

## 2014-08-31 DIAGNOSIS — I10 Essential (primary) hypertension: Secondary | ICD-10-CM | POA: Insufficient documentation

## 2014-08-31 DIAGNOSIS — Z9861 Coronary angioplasty status: Secondary | ICD-10-CM | POA: Diagnosis not present

## 2014-08-31 DIAGNOSIS — G8929 Other chronic pain: Secondary | ICD-10-CM | POA: Diagnosis not present

## 2014-08-31 DIAGNOSIS — F209 Schizophrenia, unspecified: Secondary | ICD-10-CM | POA: Insufficient documentation

## 2014-08-31 DIAGNOSIS — R040 Epistaxis: Secondary | ICD-10-CM | POA: Insufficient documentation

## 2014-08-31 DIAGNOSIS — Z7902 Long term (current) use of antithrombotics/antiplatelets: Secondary | ICD-10-CM | POA: Diagnosis not present

## 2014-08-31 DIAGNOSIS — Z8673 Personal history of transient ischemic attack (TIA), and cerebral infarction without residual deficits: Secondary | ICD-10-CM | POA: Diagnosis not present

## 2014-08-31 DIAGNOSIS — M1990 Unspecified osteoarthritis, unspecified site: Secondary | ICD-10-CM | POA: Insufficient documentation

## 2014-08-31 DIAGNOSIS — I2510 Atherosclerotic heart disease of native coronary artery without angina pectoris: Secondary | ICD-10-CM | POA: Insufficient documentation

## 2014-08-31 DIAGNOSIS — Z79899 Other long term (current) drug therapy: Secondary | ICD-10-CM | POA: Diagnosis not present

## 2014-08-31 DIAGNOSIS — Z86010 Personal history of colonic polyps: Secondary | ICD-10-CM | POA: Insufficient documentation

## 2014-08-31 DIAGNOSIS — E785 Hyperlipidemia, unspecified: Secondary | ICD-10-CM | POA: Diagnosis not present

## 2014-08-31 DIAGNOSIS — Z8709 Personal history of other diseases of the respiratory system: Secondary | ICD-10-CM | POA: Insufficient documentation

## 2014-08-31 DIAGNOSIS — I4891 Unspecified atrial fibrillation: Secondary | ICD-10-CM | POA: Diagnosis not present

## 2014-08-31 DIAGNOSIS — Z87891 Personal history of nicotine dependence: Secondary | ICD-10-CM | POA: Insufficient documentation

## 2014-08-31 LAB — I-STAT CHEM 8, ED
BUN: 30 mg/dL — ABNORMAL HIGH (ref 6–23)
Calcium, Ion: 1.23 mmol/L (ref 1.13–1.30)
Chloride: 103 mmol/L (ref 96–112)
Creatinine, Ser: 1.5 mg/dL — ABNORMAL HIGH (ref 0.50–1.35)
Glucose, Bld: 86 mg/dL (ref 70–99)
HCT: 46 % (ref 39.0–52.0)
Hemoglobin: 15.6 g/dL (ref 13.0–17.0)
Potassium: 4.1 mmol/L (ref 3.5–5.1)
Sodium: 141 mmol/L (ref 135–145)
TCO2: 23 mmol/L (ref 0–100)

## 2014-08-31 MED ORDER — OXYMETAZOLINE HCL 0.05 % NA SOLN
1.0000 | Freq: Once | NASAL | Status: AC
  Administered 2014-08-31: 1 via NASAL
  Filled 2014-08-31: qty 15

## 2014-08-31 NOTE — ED Notes (Addendum)
Pt from home with reports of intermittent nose bleed x 2 month. Pt states recent surgeries on neck, stomach, and chest and started nose bleed since. nad noted. Pt denies taking any blood thinners. Denies any weakness or fatigue. No active bleeding at this time.

## 2014-08-31 NOTE — ED Provider Notes (Signed)
CSN: [id number]     Arrival date & time 08/31/14  1505 History   First MD Initiated Contact with Patient 08/31/14 1814     Chief Complaint  Patient presents with  . Epistaxis     (Consider location/radiation/quality/duration/timing/severity/associated sxs/prior Treatment) HPI Comments: Patient presents with 2 months of intermittent epistaxis from left naris.  It was bleeding earlier today, but is not bleeding currently.  He reports he has had a cold recently is blowing his nose a lot.  He denies putting anything up his nose.  Her using any nasal sprays.  He is on Plavix but no other blood thinners.  He has not had any fevers, chills, facial pain, chest pain, shortness of breath, lightheadedness.   Past Medical History  Diagnosis Date  . AAA (abdominal aortic aneurysm)   . Carotid artery occlusion   . Coronary artery disease   . Arthritis     "legs" (01/19/2014)  . Chronic back pain     "neck to tailbone" (01/19/2014)  . Hyperlipidemia     takes Zocor daily  . History of bronchitis 53yrs ago  . Seizures     has had one several yrs ago but never been on meds  . Stroke 04/16/1986    takes Plavix daily as well as Savaysa;right sided weakness  . Back pain     reason unknown  . Dysphagia   . History of colon polyps   . History of blood transfusion   . Schizophrenia     takes Haldol daily  . Dysrhythmia     Atrial Fib-takes Atenolol daily  . Hypertension     takes Clonidine,Apresoline,Imdur,and Lisinopril daily   Past Surgical History  Procedure Laterality Date  . Carpal tunnel release Right   . Eye surgery Bilateral     cataract surgery  . Colonoscopy    . Abdominal aortic endovascular stent graft N/A 02/01/2014    Procedure: ABDOMINAL AORTIC ENDOVASCULAR STENT GRAFT;  Surgeon: Elam Dutch, MD;  Location: [facility] OR;  Service: Vascular;  Laterality: N/A;  . Coronary angioplasty with stent placement  01/19/2014    "1"  . Endarterectomy Left 03/16/2014    Procedure: LEFT CAROTID  ENDARTERECTOMY WITH PATCH ANGIOPLASTY ;  Surgeon: Elam Dutch, MD;  Location: [place];  Service: Vascular;  Laterality: Left;  . Left heart catheterization with coronary angiogram N/A 01/19/2014    Procedure: LEFT HEART CATHETERIZATION WITH CORONARY ANGIOGRAM;  Surgeon: Laverda Page, MD;  Location: [facility] CATH LAB;  Service: Cardiovascular;  Laterality: N/A;  . Percutaneous coronary stent intervention (pci-s)  01/19/2014    Procedure: PERCUTANEOUS CORONARY STENT INTERVENTION (PCI-S);  Surgeon: Laverda Page, MD;  Location: [facility] CATH LAB;  Service: Cardiovascular;;   Family History  Problem Relation Age of Onset  . Cancer Father   . Hyperlipidemia Father   . Hypertension Father   . Heart disease Sister   . AAA (abdominal aortic aneurysm) Sister    History  Substance Use Topics  . Smoking status: Former Smoker -- 1.00 packs/day for 58 years    Types: Cigarettes  . Smokeless tobacco: Never Used     Comment: quit smoking a couple of yrs ago  . Alcohol Use: No    Review of Systems  Constitutional: Negative for fever, activity change, appetite change and fatigue.  HENT: Positive for nosebleeds. Negative for congestion, facial swelling, rhinorrhea and trouble swallowing.   Eyes: Negative for photophobia and pain.  Respiratory: Negative for cough, chest tightness and shortness of  breath.   Cardiovascular: Negative for chest pain and leg swelling.  Gastrointestinal: Negative for nausea, vomiting, abdominal pain, diarrhea and constipation.  Endocrine: Negative for polydipsia and polyuria.  Genitourinary: Negative for dysuria, urgency, decreased urine volume and difficulty urinating.  Musculoskeletal: Negative for back pain and gait problem.  Skin: Negative for color change, rash and wound.  Allergic/Immunologic: Negative for immunocompromised state.  Neurological: Negative for dizziness, facial asymmetry, speech difficulty, weakness, numbness and headaches.  Psychiatric/Behavioral:  Negative for confusion, decreased concentration and agitation.      Allergies  Review of patient's allergies indicates no known allergies.  Home Medications   Prior to Admission medications   Medication Sig Start Date End Date Taking? Authorizing Provider  atenolol (TENORMIN) 100 MG tablet Take 1 tablet (100 mg total) by mouth 2 (two) times daily. 01/20/14   Laverda Page, MD  cloNIDine (CATAPRES) 0.1 MG tablet Take 0.1 mg by mouth daily.  10/14/13   Historical Provider, MD  clopidogrel (PLAVIX) 75 MG tablet Take 1 tablet (75 mg total) by mouth daily with breakfast. 01/20/14   Laverda Page, MD  Edoxaban Tosylate (SAVAYSA) 60 MG TABS Take 60 mg by mouth daily.    Historical Provider, MD  haloperidol (HALDOL) 5 MG tablet Take 2.5 mg by mouth at bedtime.  08/30/13   Historical Provider, MD  hydrALAZINE (APRESOLINE) 50 MG tablet Take 50 mg by mouth daily. 01/20/14   Laverda Page, MD  isosorbide mononitrate (IMDUR) 60 MG 24 hr tablet Take 60 mg by mouth at bedtime. 01/20/14   Laverda Page, MD  lisinopril (PRINIVIL,ZESTRIL) 10 MG tablet Take 10 mg by mouth daily.    Historical Provider, MD  oxyCODONE (OXY IR/ROXICODONE) 5 MG immediate release tablet Take 1 tablet (5 mg total) by mouth every 6 (six) hours as needed for severe pain. 03/17/14   Alvia Grove, PA-C  oxyCODONE-acetaminophen (PERCOCET) 5-325 MG per tablet Take 2 tablets by mouth every 4 (four) hours as needed. 06/09/14   Courtney A Forcucci, PA-C  simvastatin (ZOCOR) 40 MG tablet Take 40 mg by mouth at bedtime.     Historical Provider, MD  trihexyphenidyl (ARTANE) 2 MG tablet Take 2 mg by mouth daily.  08/25/13   Historical Provider, MD   BP 127/89 mmHg  Pulse 85  Temp(Src) 97.5 F (36.4 C) (Oral)  Resp 16  SpO2 99% Physical Exam  Constitutional: He is oriented to person, place, and time. He appears well-developed and well-nourished. No distress.  HENT:  Head: Normocephalic and atraumatic.  Nose:     Mouth/Throat: No oropharyngeal exudate.  Mucosal erythema and irritation  Eyes: Pupils are equal, round, and reactive to light.  Neck: Normal range of motion. Neck supple.  Cardiovascular: Normal rate, regular rhythm and normal heart sounds.  Exam reveals no gallop and no friction rub.   No murmur heard. Pulmonary/Chest: Effort normal and breath sounds normal. No respiratory distress. He has no wheezes. He has no rales.  Abdominal: Soft. Bowel sounds are normal. He exhibits no distension and no mass. There is no tenderness. There is no rebound and no guarding.  Musculoskeletal: Normal range of motion. He exhibits no edema or tenderness.  Neurological: He is alert and oriented to person, place, and time.  Skin: Skin is warm and dry.  Psychiatric: He has a normal mood and affect.    ED Course  Procedures (including critical care time) Labs Review Labs Reviewed  I-STAT CHEM 8, ED    Imaging Review No  results found.   EKG Interpretation None      MDM   Final diagnoses:  Epistaxis    Pt is a 70 y.o. male with Pmhx as above who presents with 2 months of intermittent epistaxis, no bleeding currently, though did bleed earlier today.  He reports having a cold recently and blowing his nose a lot.  He denies fevers, chills, facial pain, chest pain, shortness of breath or lightheadedness.  He is on Plavix but no other anticoagulants.  I-STAT chem 8 with stable H&H stable.  DC home with Afrin and strict instructions to not insert anything in the nose, avoid nose blowing    [place] evaluation in the Emergency Department is complete. It has been determined that no acute conditions requiring further emergency intervention are present at this time. The patient/guardian have been advised of the diagnosis and plan. We have discussed signs and symptoms that warrant return to the ED, such as changes or worsening in symptoms, worsening symptoms, uncontrolled bleeding, lightheadedness,  chest pain, SOB.       Ernestina Patches, MD 08/31/14 [phone]

## 2014-08-31 NOTE — ED Notes (Signed)
Pt sts intermittent nose bleed when blowing nose x 3 days; no bleeding noted at present

## 2014-08-31 NOTE — Discharge Instructions (Signed)
Nosebleed °A nosebleed can be caused by many things, including: °· Getting hit hard in the nose. °· Infections. °· Dry nose. °· Colds. °· Medicines. °Your doctor may do lab testing if you get nosebleeds a lot and the cause is not known. °HOME CARE  °· If your nose was packed with material, keep it there until your doctor takes it out. Put the pack back in your nose if the pack falls out. °· Do not blow your nose for 12 hours after the nosebleed. °· Sit up and bend forward if your nose starts bleeding again. Pinch the front half of your nose nonstop for 20 minutes. °· Put petroleum jelly inside your nose every morning if you have a dry nose. °· Use a humidifier to make the air less dry. °· Do not take aspirin. °· Try not to strain, lift, or bend at the waist for many days after the nosebleed. °GET HELP RIGHT AWAY IF:  °· Nosebleeds keep happening and are hard to stop or control. °· You have bleeding or bruises that are not normal on other parts of the body. °· You have a fever. °· The nosebleeds get worse. °· You get lightheaded, feel faint, sweaty, or throw up (vomit) blood. °MAKE SURE YOU:  °· Understand these instructions. °· Will watch your condition. °· Will get help right away if you are not doing well or get worse. °Document Released: 04/24/2008 Document Revised: 10/08/2011 Document Reviewed: 04/24/2008 °ExitCare® Patient Information ©2015 [employer]. This information is not intended to replace advice given to you by your health care provider. Make sure you discuss any questions you have with your health care provider. ° °

## 2014-09-22 DIAGNOSIS — E784 Other hyperlipidemia: Secondary | ICD-10-CM | POA: Diagnosis not present

## 2014-09-22 DIAGNOSIS — R7301 Impaired fasting glucose: Secondary | ICD-10-CM | POA: Diagnosis not present

## 2014-09-22 DIAGNOSIS — I10 Essential (primary) hypertension: Secondary | ICD-10-CM | POA: Diagnosis not present

## 2014-09-22 DIAGNOSIS — G20 Parkinson's disease: Secondary | ICD-10-CM | POA: Diagnosis not present

## 2014-09-27 ENCOUNTER — Other Ambulatory Visit: Payer: Self-pay | Admitting: *Deleted

## 2014-09-27 DIAGNOSIS — Z48812 Encounter for surgical aftercare following surgery on the circulatory system: Secondary | ICD-10-CM

## 2014-09-27 DIAGNOSIS — I714 Abdominal aortic aneurysm, without rupture, unspecified: Secondary | ICD-10-CM

## 2014-09-28 ENCOUNTER — Telehealth: Payer: Self-pay | Admitting: Vascular Surgery

## 2014-09-28 NOTE — Telephone Encounter (Addendum)
-----   Message from Mena Goes, RN sent at 09/27/2014 11:04 AM EST ----- Regarding: RE: Elevated Labs I put in a new order for CT abdomen / pelvis No contrast.  His Creatinine is trending up and his GFR had been going done recently. Just to be safe.  ----- Message -----    From: Gena Fray    Sent: 09/27/2014  10:20 AM      To: Mena Goes, RN Subject: Elevated Labs                                  Rudell Cobb,  Could you look at this gentlemans last BUN/Creat in EPIC (from 09/04/14 I believe). 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 Can you calculate the GFR ??  Thanks! Hinton Dyer

## 2014-09-28 NOTE — Telephone Encounter (Signed)
Scheduled with non-contrast CT, dpm

## 2014-09-28 NOTE — Telephone Encounter (Signed)
Error

## 2014-09-29 ENCOUNTER — Encounter: Payer: Self-pay | Admitting: Vascular Surgery

## 2014-09-30 ENCOUNTER — Ambulatory Visit
Admission: RE | Admit: 2014-09-30 | Discharge: 2014-09-30 | Disposition: A | Discharge status: Home / Self Care | Payer: Medicare Other | Source: Ambulatory Visit | Attending: Vascular Surgery | Admitting: Vascular Surgery

## 2014-09-30 ENCOUNTER — Ambulatory Visit (HOSPITAL_COMMUNITY)
Admission: RE | Admit: 2014-09-30 | Discharge: 2014-09-30 | Disposition: A | Discharge status: Home / Self Care | Payer: Medicare Other | Source: Ambulatory Visit | Attending: Vascular Surgery | Admitting: Vascular Surgery

## 2014-09-30 ENCOUNTER — Ambulatory Visit (INDEPENDENT_AMBULATORY_CARE_PROVIDER_SITE_OTHER): Payer: Medicare Other | Admitting: Vascular Surgery

## 2014-09-30 ENCOUNTER — Ambulatory Visit: Payer: Medicare Other | Admitting: Vascular Surgery

## 2014-09-30 ENCOUNTER — Other Ambulatory Visit: Payer: Medicare Other

## 2014-09-30 ENCOUNTER — Encounter: Payer: Self-pay | Admitting: Vascular Surgery

## 2014-09-30 VITALS — BP 127/88 | HR 93 | Resp 16 | Ht 73.0 in | Wt 194.0 lb

## 2014-09-30 DIAGNOSIS — I6529 Occlusion and stenosis of unspecified carotid artery: Secondary | ICD-10-CM

## 2014-09-30 DIAGNOSIS — I6523 Occlusion and stenosis of bilateral carotid arteries: Secondary | ICD-10-CM

## 2014-09-30 DIAGNOSIS — Z48812 Encounter for surgical aftercare following surgery on the circulatory system: Secondary | ICD-10-CM | POA: Diagnosis not present

## 2014-09-30 DIAGNOSIS — I714 Abdominal aortic aneurysm, without rupture, unspecified: Secondary | ICD-10-CM

## 2014-09-30 DIAGNOSIS — Z95828 Presence of other vascular implants and grafts: Secondary | ICD-10-CM | POA: Diagnosis not present

## 2014-09-30 NOTE — Addendum Note (Signed)
Addended by: Mena Goes on: 09/30/2014 05:11 PM   Modules accepted: Orders

## 2014-09-30 NOTE — Progress Notes (Addendum)
HISTORY AND PHYSICAL     CC:  Follow up carotid duplex scan and CTA for EVAR  Referring Provider:  Philis Fendt, MD  HPI: This is a 69 y.o. Wesley [name] who has known carotid stenosis is here for f/u carotid duplex scan.  Denies amaurosis fugax, paresthesias, or hemiparesis.  He is s/p left CEA 03/16/14.  He states he does have some numbness in his arms when he is sleeping and states this is positional.    He is also here today for follow up to his EVAR 02/02/15.  He has done well from this aspect.    The pt is on a statin for his hypercholesterolemia.  He is on an ACEI & BB for his HTN.  He is also on Plavix.  He was recently in the ER for epistaxis, which was controlled.  He was discharged with Afrin.  Past Medical History  Diagnosis Date  . AAA (abdominal aortic aneurysm)   . Carotid artery occlusion   . Coronary artery disease   . Arthritis     "legs" (01/19/2014)  . Chronic back pain     "neck to tailbone" (01/19/2014)  . Hyperlipidemia     takes Zocor daily  . History of bronchitis 26yrs ago  . Seizures     has had one several yrs ago but never been on meds  . Stroke 04/16/1986    takes Plavix daily as well as Savaysa;right sided weakness  . Back pain     reason unknown  . Dysphagia   . History of colon polyps   . History of blood transfusion   . Schizophrenia     takes Haldol daily  . Dysrhythmia     Atrial Fib-takes Atenolol daily  . Hypertension     takes Clonidine,Apresoline,Imdur,and Lisinopril daily    Past Surgical History  Procedure Laterality Date  . Carpal tunnel release Right   . Eye surgery Bilateral     cataract surgery  . Colonoscopy    . Abdominal aortic endovascular stent graft N/A 02/01/2014    Procedure: ABDOMINAL AORTIC ENDOVASCULAR STENT GRAFT;  Surgeon: Elam Dutch, MD;  Location: [facility] OR;  Service: Vascular;  Laterality: N/A;  . Coronary angioplasty with stent placement  01/19/2014    "1"  . Endarterectomy Left 03/16/2014    Procedure: LEFT  CAROTID ENDARTERECTOMY WITH PATCH ANGIOPLASTY ;  Surgeon: Elam Dutch, MD;  Location: [place];  Service: Vascular;  Laterality: Left;  . Left heart catheterization with coronary angiogram N/A 01/19/2014    Procedure: LEFT HEART CATHETERIZATION WITH CORONARY ANGIOGRAM;  Surgeon: Laverda Page, MD;  Location: [facility] CATH LAB;  Service: Cardiovascular;  Laterality: N/A;  . Percutaneous coronary stent intervention (pci-s)  01/19/2014    Procedure: PERCUTANEOUS CORONARY STENT INTERVENTION (PCI-S);  Surgeon: Laverda Page, MD;  Location: [facility] CATH LAB;  Service: Cardiovascular;;    No Known Allergies  Current Outpatient Prescriptions  Medication Sig Dispense Refill  . atenolol (TENORMIN) 100 MG tablet Take 1 tablet (100 mg total) by mouth 2 (two) times daily. 180 tablet 1  . cloNIDine (CATAPRES) 0.1 MG tablet Take 0.1 mg by mouth daily.     . clopidogrel (PLAVIX) 75 MG tablet Take 1 tablet (75 mg total) by mouth daily with breakfast. 30 tablet 6  . Edoxaban Tosylate (SAVAYSA) 60 MG TABS Take 60 mg by mouth daily.    . haloperidol (HALDOL) 5 MG tablet Take 2.5 mg by mouth at bedtime.     Marland Kitchen  hydrALAZINE (APRESOLINE) 50 MG tablet Take 50 mg by mouth daily.    . isosorbide mononitrate (IMDUR) 60 MG 24 hr tablet Take 60 mg by mouth at bedtime.    Marland Kitchen lisinopril (PRINIVIL,ZESTRIL) 10 MG tablet Take 10 mg by mouth daily.    . simvastatin (ZOCOR) 40 MG tablet Take 40 mg by mouth at bedtime.     . trihexyphenidyl (ARTANE) 2 MG tablet Take 2 mg by mouth daily.     Marland Kitchen oxyCODONE (OXY IR/ROXICODONE) 5 MG immediate release tablet Take 1 tablet (5 mg total) by mouth every 6 (six) hours as needed for severe pain. (Patient not taking: Reported on 09/30/2014) 20 tablet 0  . oxyCODONE-acetaminophen (PERCOCET) 5-325 MG per tablet Take 2 tablets by mouth every 4 (four) hours as needed. (Patient not taking: Reported on 09/30/2014) 6 tablet 0   No current facility-administered medications for this visit.    Pt's meds  include: Statin:  Yes.   Beta Blocker:  Yes.   Aspirin:  No. Other antiplatelets/anticoagulants:  Yes.   Plavix   Family History  Problem Relation Age of Onset  . Cancer Father   . Hyperlipidemia Father   . Hypertension Father   . Heart disease Sister   . AAA (abdominal aortic aneurysm) Sister     History   Social History  . Marital Status: Widowed    Spouse Name: N/A  . Number of Children: N/A  . Years of Education: N/A   Occupational History  . Not on file.   Social History Main Topics  . Smoking status: Former Smoker -- 1.00 packs/day for 58 years    Types: Cigarettes  . Smokeless tobacco: Never Used     Comment: quit smoking a couple of yrs ago  . Alcohol Use: No  . Drug Use: No  . Sexual Activity: Not on file   Other Topics Concern  . Not on file   Social History Narrative     ROS: [x]  Positive   [ ]  Negative   [ ]  All sytems reviewed and are negative  Cardiovascular: []  chest pain/pressure []  palpitations [x]  SOB lying flat []  DOE []  pain in legs while walking []  pain in feet when lying flat []  hx of DVT []  hx of phlebitis []  swelling in legs []  varicose veins  Pulmonary: []  productive cough []  asthma []  wheezing  Neurologic: []  weakness in []  arms []  legs [x]  numbness in [x]  arms []  legs [] difficulty speaking or slurred speech []  temporary loss of vision in one eye []  dizziness  Hematologic: [x]  bleeding problems-recent nosebleed (visit to ER) []  problems with blood clotting easily  GI []  vomiting blood []  blood in stool  GU: []  burning with urination []  blood in urine  Psychiatric: []  hx of major depression  Integumentary: []  rashes []  ulcers  Constitutional: []  fever []  chills   PHYSICAL EXAMINATION:  Filed Vitals:   09/30/14 1419  BP: 127/88  Pulse: 93  Resp:    Body mass index is 25.6 kg/(m^2).  General:  WDWN in NAD Gait: Normal HENT: WNL; normocephalic Pulmonary: normal non-labored breathing , without  Rales, rhonchi,  wheezing Cardiac: regular, without  Murmurs, rubs or gallops; without carotid bruits Abdomen: soft, NT, no masses Skin: without rashes,  without ulcers  Vascular Exam/Pulses:  Right Left  Aorta Aneurysmal sac is palpable & is not pulsatile   Femoral 2+ (normal) 2+ (normal)   Extremities: without ischemic changes, without Gangrene , without cellulitis; without open wounds;  Musculoskeletal:  without muscle wasting or atrophy  Neurologic: A&O X 3; Appropriate Affect ; SENSATION: normal; MOTOR FUNCTION:  moving all extremities equally. Speech is fluent/normal   Non-Invasive Vascular Imaging: Carotid Duplex Scan:  09/30/2014  1.  Evidence of 60-79% stenosis of the right ICA 2.  Widely patent left ICA without evidence of restenosis or hyperplasia 3.  Bilateral vertebral arteries are antegrade  CT 09/30/14: IMPRESSION: Lack of intravenous contrast limits the exam.  Marked reduction in the size of the aneurysm sac. Maximal previous diameter was 5.8 cm and maximal AP diameter today is only 4.4 cm.   ASSESSMENT: Wesley Wesley [name] here for f/u carotid duplex scan s/p left CEA 03/16/14 and EVAR 02/01/14   PLAN: -pt doing well s/p left CEA and EVAR.   -his right carotid artery stenosis is stable from his last visit.  He has been asymptomatic.  He stated that he has had some numbness in his arms, but this is positional when he sleeps at night and happens in both arms.  Symptoms of stroke were discussed with the pt and his son and they know to call sooner if he displays any of these. -his AAA aneurysmal sac has decreased in size from 5.8cm to 4.4cm.  His CT performed today was without contrast as his creatinine on 08/31/14 was 1.5.  When he returns in 6 months, we will get an ultrasound of AAA as well as a carotid duplex  -he is on Plavix and there is some question whether or not he is also on Edoxaban.  According to the ER notes in February, he is only on Plavix.    Leontine Locket,  PA-C [facility] [phone]  Clinic MD:   Pt seen and examined in conjunction with Dr. Oneida Alar   History and exam details as above. His aneurysm has decreased in size from 5.8-4.4 cm which usually indicates an excellent seal. He has had no TIA amaurosis or stroke symptoms. His carotid endarterectomy site was widely patent today. He has a moderate asymptomatic stenosis on the contralateral side.  He'll return in 6 months for an ultrasound of his abdominal aortic aneurysm for follow-up. He will also have a carotid duplex scan at that time.  Ruta Hinds, MD [facility] Office: [phone] Pager: [phone]

## 2014-11-22 ENCOUNTER — Ambulatory Visit: Payer: Medicare Other

## 2014-12-09 ENCOUNTER — Ambulatory Visit: Payer: Medicare Other

## 2014-12-22 DIAGNOSIS — G20 Parkinson's disease: Secondary | ICD-10-CM | POA: Diagnosis not present

## 2014-12-22 DIAGNOSIS — E784 Other hyperlipidemia: Secondary | ICD-10-CM | POA: Diagnosis not present

## 2014-12-22 DIAGNOSIS — R7301 Impaired fasting glucose: Secondary | ICD-10-CM | POA: Diagnosis not present

## 2014-12-22 DIAGNOSIS — I4892 Unspecified atrial flutter: Secondary | ICD-10-CM | POA: Diagnosis not present

## 2014-12-22 DIAGNOSIS — I10 Essential (primary) hypertension: Secondary | ICD-10-CM | POA: Diagnosis not present

## 2014-12-24 ENCOUNTER — Ambulatory Visit (INDEPENDENT_AMBULATORY_CARE_PROVIDER_SITE_OTHER): Payer: Medicare Other | Admitting: Podiatry

## 2014-12-24 ENCOUNTER — Encounter: Payer: Self-pay | Admitting: Podiatry

## 2014-12-24 DIAGNOSIS — M79676 Pain in unspecified toe(s): Secondary | ICD-10-CM | POA: Diagnosis not present

## 2014-12-24 DIAGNOSIS — B351 Tinea unguium: Secondary | ICD-10-CM | POA: Diagnosis not present

## 2014-12-24 NOTE — Progress Notes (Signed)
Patient ID: Wesley [name], male   DOB: [birth year], 69 y.o.   MRN: [id number] Complaint:  Visit Type: Patient returns to my office for continued preventative foot care services. Complaint: Patient states" my nails have grown long and thick and become painful to walk and wear shoes" . He presents for preventative foot care services. No changes to ROS  Podiatric Exam: Vascular: dorsalis pedis and posterior tibial pulses are palpable bilateral. Capillary return is immediate. Temperature gradient is WNL. Skin turgor WNL  Sensorium: Normal Semmes Weinstein monofilament test. Normal tactile sensation bilaterally. Nail Exam: Pt has thick disfigured discolored nails with subungual debris noted bilateral entire nail hallux through fifth toenails Ulcer Exam: There is no evidence of ulcer or pre-ulcerative changes or infection. Orthopedic Exam: Muscle tone and strength are WNL. No limitations in general ROM. No crepitus or effusions noted. Foot type and digits show no abnormalities. Bony prominences are unremarkable. Skin: No Porokeratosis. No infection or ulcers.  Callus hallux B/L.  Diagnosis:  Tinea unguium, Pain in right toe, pain in left toes.  Callus Hallux  B/L.  Treatment & Plan Procedures and Treatment: Consent by patient was obtained for treatment procedures. The patient understood the discussion of treatment and procedures well. All questions were answered thoroughly reviewed. Debridement of mycotic and hypertrophic toenails, 1 through 5 bilateral and clearing of subungual debris. No ulceration, no infection noted. Debride callus x2 Return Visit-Office Procedure: Patient instructed to return to the office for a follow up visit 3 months for continued evaluation and treatment.

## 2015-02-05 ENCOUNTER — Inpatient Hospital Stay (HOSPITAL_COMMUNITY)
Admission: EM | Admit: 2015-02-05 | Discharge: 2015-02-07 | DRG: 684 | Disposition: A | Discharge status: Home / Self Care | Payer: Medicare Other | Attending: Internal Medicine | Admitting: Internal Medicine

## 2015-02-05 ENCOUNTER — Encounter (HOSPITAL_COMMUNITY): Payer: Self-pay | Admitting: Emergency Medicine

## 2015-02-05 ENCOUNTER — Emergency Department (HOSPITAL_COMMUNITY): Payer: Medicare Other

## 2015-02-05 ENCOUNTER — Inpatient Hospital Stay (HOSPITAL_COMMUNITY): Payer: Medicare Other

## 2015-02-05 DIAGNOSIS — Z87891 Personal history of nicotine dependence: Secondary | ICD-10-CM

## 2015-02-05 DIAGNOSIS — Z8679 Personal history of other diseases of the circulatory system: Secondary | ICD-10-CM

## 2015-02-05 DIAGNOSIS — G8929 Other chronic pain: Secondary | ICD-10-CM | POA: Diagnosis present

## 2015-02-05 DIAGNOSIS — Z86010 Personal history of colonic polyps: Secondary | ICD-10-CM | POA: Diagnosis not present

## 2015-02-05 DIAGNOSIS — I739 Peripheral vascular disease, unspecified: Secondary | ICD-10-CM | POA: Diagnosis present

## 2015-02-05 DIAGNOSIS — I2510 Atherosclerotic heart disease of native coronary artery without angina pectoris: Secondary | ICD-10-CM | POA: Diagnosis present

## 2015-02-05 DIAGNOSIS — R296 Repeated falls: Secondary | ICD-10-CM | POA: Diagnosis not present

## 2015-02-05 DIAGNOSIS — N179 Acute kidney failure, unspecified: Secondary | ICD-10-CM | POA: Diagnosis not present

## 2015-02-05 DIAGNOSIS — R55 Syncope and collapse: Secondary | ICD-10-CM | POA: Diagnosis not present

## 2015-02-05 DIAGNOSIS — F209 Schizophrenia, unspecified: Secondary | ICD-10-CM | POA: Diagnosis present

## 2015-02-05 DIAGNOSIS — Z7902 Long term (current) use of antithrombotics/antiplatelets: Secondary | ICD-10-CM

## 2015-02-05 DIAGNOSIS — I482 Chronic atrial fibrillation, unspecified: Secondary | ICD-10-CM

## 2015-02-05 DIAGNOSIS — E860 Dehydration: Secondary | ICD-10-CM | POA: Diagnosis present

## 2015-02-05 DIAGNOSIS — R1310 Dysphagia, unspecified: Secondary | ICD-10-CM | POA: Diagnosis present

## 2015-02-05 DIAGNOSIS — I714 Abdominal aortic aneurysm, without rupture: Secondary | ICD-10-CM

## 2015-02-05 DIAGNOSIS — R569 Unspecified convulsions: Secondary | ICD-10-CM | POA: Diagnosis present

## 2015-02-05 DIAGNOSIS — S299XXA Unspecified injury of thorax, initial encounter: Secondary | ICD-10-CM | POA: Diagnosis not present

## 2015-02-05 DIAGNOSIS — R197 Diarrhea, unspecified: Secondary | ICD-10-CM | POA: Diagnosis present

## 2015-02-05 DIAGNOSIS — I10 Essential (primary) hypertension: Secondary | ICD-10-CM

## 2015-02-05 DIAGNOSIS — I951 Orthostatic hypotension: Secondary | ICD-10-CM | POA: Diagnosis present

## 2015-02-05 DIAGNOSIS — N19 Unspecified kidney failure: Secondary | ICD-10-CM | POA: Diagnosis not present

## 2015-02-05 DIAGNOSIS — Z8673 Personal history of transient ischemic attack (TIA), and cerebral infarction without residual deficits: Secondary | ICD-10-CM | POA: Diagnosis not present

## 2015-02-05 DIAGNOSIS — Z9841 Cataract extraction status, right eye: Secondary | ICD-10-CM | POA: Diagnosis not present

## 2015-02-05 DIAGNOSIS — M6281 Muscle weakness (generalized): Secondary | ICD-10-CM | POA: Diagnosis not present

## 2015-02-05 DIAGNOSIS — M549 Dorsalgia, unspecified: Secondary | ICD-10-CM | POA: Diagnosis present

## 2015-02-05 DIAGNOSIS — F0390 Unspecified dementia without behavioral disturbance: Secondary | ICD-10-CM | POA: Diagnosis present

## 2015-02-05 DIAGNOSIS — E785 Hyperlipidemia, unspecified: Secondary | ICD-10-CM | POA: Diagnosis present

## 2015-02-05 DIAGNOSIS — Z9842 Cataract extraction status, left eye: Secondary | ICD-10-CM | POA: Diagnosis not present

## 2015-02-05 DIAGNOSIS — R42 Dizziness and giddiness: Secondary | ICD-10-CM | POA: Diagnosis present

## 2015-02-05 DIAGNOSIS — M1990 Unspecified osteoarthritis, unspecified site: Secondary | ICD-10-CM | POA: Diagnosis present

## 2015-02-05 LAB — CBC
HCT: 42.3 % (ref 39.0–52.0)
HEMOGLOBIN: 14.6 g/dL (ref 13.0–17.0)
MCH: 30.7 pg (ref 26.0–34.0)
MCHC: 34.5 g/dL (ref 30.0–36.0)
MCV: 88.9 fL (ref 78.0–100.0)
PLATELETS: 280 10*3/uL (ref 150–400)
RBC: 4.76 MIL/uL (ref 4.22–5.81)
RDW: 13 % (ref 11.5–15.5)
WBC: 7.7 10*3/uL (ref 4.0–10.5)

## 2015-02-05 LAB — BASIC METABOLIC PANEL
Anion gap: 12 (ref 5–15)
BUN: 82 mg/dL — ABNORMAL HIGH (ref 6–20)
CALCIUM: 9.5 mg/dL (ref 8.9–10.3)
CO2: 20 mmol/L — ABNORMAL LOW (ref 22–32)
CREATININE: 5.46 mg/dL — AB (ref 0.61–1.24)
Chloride: 101 mmol/L (ref 101–111)
GFR, EST AFRICAN AMERICAN: 11 mL/min — AB (ref >60)
GFR, EST NON AFRICAN AMERICAN: 10 mL/min — AB (ref >60)
GLUCOSE: 106 mg/dL — AB (ref 65–99)
Potassium: 4.8 mmol/L (ref 3.5–5.1)
Sodium: 133 mmol/L — ABNORMAL LOW (ref 135–145)

## 2015-02-05 LAB — I-STAT TROPONIN, ED: TROPONIN I, POC: 0.01 ng/mL (ref 0.00–0.08)

## 2015-02-05 LAB — URINALYSIS, ROUTINE W REFLEX MICROSCOPIC
BILIRUBIN URINE: NEGATIVE
Glucose, UA: NEGATIVE mg/dL
HGB URINE DIPSTICK: NEGATIVE
Ketones, ur: NEGATIVE mg/dL
Leukocytes, UA: NEGATIVE
Nitrite: NEGATIVE
Protein, ur: NEGATIVE mg/dL
SPECIFIC GRAVITY, URINE: 1.018 (ref 1.005–1.030)
Urobilinogen, UA: 0.2 mg/dL (ref 0.0–1.0)
pH: 5 (ref 5.0–8.0)

## 2015-02-05 LAB — HEPATIC FUNCTION PANEL
ALK PHOS: 67 U/L (ref 38–126)
ALT: 20 U/L (ref 17–63)
AST: 22 U/L (ref 15–41)
Albumin: 3.5 g/dL (ref 3.5–5.0)
BILIRUBIN TOTAL: 0.4 mg/dL (ref 0.3–1.2)
TOTAL PROTEIN: 7.1 g/dL (ref 6.5–8.1)

## 2015-02-05 LAB — LACTIC ACID, PLASMA: LACTIC ACID, VENOUS: 1.6 mmol/L (ref 0.5–2.0)

## 2015-02-05 MED ORDER — HALOPERIDOL 2 MG PO TABS
2.5000 mg | ORAL_TABLET | Freq: Every day | ORAL | Status: DC
  Administered 2015-02-05 – 2015-02-06 (×2): 2.5 mg via ORAL
  Filled 2015-02-05 (×3): qty 1

## 2015-02-05 MED ORDER — HYDRALAZINE HCL 50 MG PO TABS
50.0000 mg | ORAL_TABLET | Freq: Three times a day (TID) | ORAL | Status: DC
  Administered 2015-02-05: 50 mg via ORAL
  Filled 2015-02-05 (×4): qty 1

## 2015-02-05 MED ORDER — ATENOLOL 100 MG PO TABS
100.0000 mg | ORAL_TABLET | Freq: Two times a day (BID) | ORAL | Status: DC
  Administered 2015-02-05 – 2015-02-07 (×4): 100 mg via ORAL
  Filled 2015-02-05 (×5): qty 1

## 2015-02-05 MED ORDER — SIMVASTATIN 20 MG PO TABS
20.0000 mg | ORAL_TABLET | Freq: Every day | ORAL | Status: DC
  Administered 2015-02-05 – 2015-02-06 (×2): 20 mg via ORAL
  Filled 2015-02-05 (×3): qty 1

## 2015-02-05 MED ORDER — TRIHEXYPHENIDYL HCL 2 MG PO TABS
2.0000 mg | ORAL_TABLET | Freq: Every day | ORAL | Status: DC
  Administered 2015-02-05 – 2015-02-06 (×2): 2 mg via ORAL
  Filled 2015-02-05 (×3): qty 1

## 2015-02-05 MED ORDER — ACETAMINOPHEN 650 MG RE SUPP: 650.0000 mg | Freq: Four times a day (QID) | RECTAL | Status: DC | PRN

## 2015-02-05 MED ORDER — SODIUM CHLORIDE 0.9 % IJ SOLN
3.0000 mL | Freq: Two times a day (BID) | INTRAMUSCULAR | Status: DC
  Administered 2015-02-06 – 2015-02-07 (×3): 3 mL via INTRAVENOUS

## 2015-02-05 MED ORDER — ZOLPIDEM TARTRATE 5 MG PO TABS: 5.0000 mg | ORAL_TABLET | Freq: Every evening | ORAL | Status: DC | PRN

## 2015-02-05 MED ORDER — CLOPIDOGREL BISULFATE 75 MG PO TABS
75.0000 mg | ORAL_TABLET | Freq: Every day | ORAL | Status: DC
  Administered 2015-02-06 – 2015-02-07 (×2): 75 mg via ORAL
  Filled 2015-02-05 (×3): qty 1

## 2015-02-05 MED ORDER — ONDANSETRON HCL 4 MG/2ML IJ SOLN
4.0000 mg | Freq: Four times a day (QID) | INTRAMUSCULAR | Status: DC | PRN
Start: 2015-02-05 — End: 2015-02-07

## 2015-02-05 MED ORDER — MORPHINE SULFATE 2 MG/ML IJ SOLN: 2.0000 mg | INTRAMUSCULAR | Status: DC | PRN

## 2015-02-05 MED ORDER — SODIUM CHLORIDE 0.9 % IV SOLN
INTRAVENOUS | Status: DC
  Administered 2015-02-05: 23:00:00 via INTRAVENOUS

## 2015-02-05 MED ORDER — ONDANSETRON HCL 4 MG PO TABS: 4.0000 mg | ORAL_TABLET | Freq: Four times a day (QID) | ORAL | Status: DC | PRN

## 2015-02-05 MED ORDER — EDOXABAN TOSYLATE 30 MG PO TABS
30.0000 mg | ORAL_TABLET | Freq: Every day | ORAL | Status: DC
  Administered 2015-02-06 – 2015-02-07 (×2): 30 mg via ORAL
  Filled 2015-02-05 (×2): qty 1

## 2015-02-05 MED ORDER — HYDROCODONE-ACETAMINOPHEN 5-325 MG PO TABS: 1.0000 | ORAL_TABLET | ORAL | Status: DC | PRN

## 2015-02-05 MED ORDER — ISOSORBIDE MONONITRATE ER 60 MG PO TB24
60.0000 mg | ORAL_TABLET | Freq: Every day | ORAL | Status: DC
  Administered 2015-02-05: 60 mg via ORAL
  Filled 2015-02-05 (×2): qty 1

## 2015-02-05 MED ORDER — SODIUM CHLORIDE 0.9 % IV BOLUS (SEPSIS)
1000.0000 mL | Freq: Once | INTRAVENOUS | Status: AC
  Administered 2015-02-05: 1000 mL via INTRAVENOUS

## 2015-02-05 MED ORDER — SODIUM CHLORIDE 0.9 % IV SOLN: INTRAVENOUS | Status: AC

## 2015-02-05 MED ORDER — CLONIDINE HCL 0.1 MG PO TABS
0.1000 mg | ORAL_TABLET | Freq: Every day | ORAL | Status: DC
  Filled 2015-02-05: qty 1

## 2015-02-05 MED ORDER — ACETAMINOPHEN 325 MG PO TABS: 650.0000 mg | ORAL_TABLET | Freq: Four times a day (QID) | ORAL | Status: DC | PRN

## 2015-02-05 MED ORDER — SODIUM CHLORIDE 0.9 % IV SOLN
Freq: Once | INTRAVENOUS | Status: AC
  Administered 2015-02-05: 19:00:00 via INTRAVENOUS

## 2015-02-05 NOTE — H&P (Signed)
[facility]                                                                                    Patient Demographics  Wesley [name], is a 69 y.o. male  CSN: [id number]  MRN: [id number]  DOB - 1945-11-16  Admit Date - 02/05/2015  Outpatient Primary MD for the patient is Philis Fendt, MD   With History of -  Past Medical History  Diagnosis Date  . AAA (abdominal aortic aneurysm)   . Carotid artery occlusion   . Coronary artery disease   . Arthritis     "legs" (01/19/2014)  . Chronic back pain     "neck to tailbone" (01/19/2014)  . Hyperlipidemia     takes Zocor daily  . History of bronchitis 107yrs ago  . Seizures     has had one several yrs ago but never been on meds  . Stroke 04/16/1986    takes Plavix daily as well as Savaysa;right sided weakness  . Back pain     reason unknown  . Dysphagia   . History of colon polyps   . History of blood transfusion   . Schizophrenia     takes Haldol daily  . Dysrhythmia     Atrial Fib-takes Atenolol daily  . Hypertension     takes Clonidine,Apresoline,Imdur,and Lisinopril daily      Past Surgical History  Procedure Laterality Date  . Carpal tunnel release Right   . Eye surgery Bilateral     cataract surgery  . Colonoscopy    . Abdominal aortic endovascular stent graft N/A 02/01/2014    Procedure: ABDOMINAL AORTIC ENDOVASCULAR STENT GRAFT;  Surgeon: Elam Dutch, MD;  Location: [facility] OR;  Service: Vascular;  Laterality: N/A;  . Coronary angioplasty with stent placement  01/19/2014    "1"  . Endarterectomy Left 03/16/2014    Procedure: LEFT CAROTID ENDARTERECTOMY WITH PATCH ANGIOPLASTY ;  Surgeon: Elam Dutch, MD;  Location: [place];  Service: Vascular;  Laterality: Left;  . Left heart catheterization with coronary angiogram N/A 01/19/2014    Procedure: LEFT HEART CATHETERIZATION WITH CORONARY ANGIOGRAM;  Surgeon: Laverda Page, MD;  Location: [facility] CATH LAB;  Service: Cardiovascular;  Laterality: N/A;  .  Percutaneous coronary stent intervention (pci-s)  01/19/2014    Procedure: PERCUTANEOUS CORONARY STENT INTERVENTION (PCI-S);  Surgeon: Laverda Page, MD;  Location: [facility] CATH LAB;  Service: Cardiovascular;;    in for   Chief Complaint  Patient presents with  . Dizziness  . Fall     HPI  Wesley [name]  is a 69 y.o. male, with past medical history significant for coronary artery disease, hypertension , AAA and schizophrenia presenting for 2 weeks history of dizziness episodes on and off. The dizziness starts when he takes his pills usually. It's worse when he stands up quickly, no history of fever chills nausea vomiting but reports mild diarrhea today. Patient lives at home with his son and has not been outside a lot. Patient denies any chest pains, palpitations, blood in the stools or his urine. In the emergency room he was found to have a creatinine of 5.46 and he was  able to urinate without any trouble so a Foley is not indicated at this time. His blood pressure was on the low side with 95/49 but then improved to 125/71 with fluids    Review of Systems    In addition to the HPI above,  No Fever-chills, No Headache, No changes with Vision or hearing, No problems swallowing food or Liquids, No Chest pain, Cough or Shortness of Breath, No Abdominal pain, No Nausea or Vommitting, reports diarrhea today, No Blood in stool or Urine, No dysuria, No new skin rashes or bruises, No new joints pains-aches,  No new weakness, tingling, numbness in any extremity, No recent weight gain or loss, No polyuria, polydypsia or polyphagia, No significant Mental Stressors.  A full 10 point Review of Systems was done, except as stated above, all other Review of Systems were negative.   Social History History  Substance Use Topics  . Smoking status: Former Smoker -- 1.00 packs/day for 58 years    Types: Cigarettes  . Smokeless tobacco: Never Used     Comment: quit smoking a couple of yrs ago  .  Alcohol Use: No     Family History Family History  Problem Relation Age of Onset  . Cancer Father   . Hyperlipidemia Father   . Hypertension Father   . Heart disease Sister   . AAA (abdominal aortic aneurysm) Sister      Prior to Admission medications   Medication Sig Start Date End Date Taking? Authorizing Provider  atenolol (TENORMIN) 100 MG tablet Take 1 tablet (100 mg total) by mouth 2 (two) times daily. 01/20/14  Yes Adrian Prows, MD  cloNIDine (CATAPRES) 0.1 MG tablet Take 0.1 mg by mouth daily.  10/14/13  Yes Historical Provider, MD  clopidogrel (PLAVIX) 75 MG tablet Take 1 tablet (75 mg total) by mouth daily with breakfast. 01/20/14  Yes Adrian Prows, MD  Edoxaban Tosylate (SAVAYSA) 60 MG TABS Take 60 mg by mouth daily.   Yes Historical Provider, MD  haloperidol (HALDOL) 5 MG tablet Take 2.5 mg by mouth at bedtime.  08/30/13  Yes Historical Provider, MD  hydrALAZINE (APRESOLINE) 50 MG tablet Take 50 mg by mouth 3 (three) times daily.  01/20/14  Yes Adrian Prows, MD  isosorbide mononitrate (IMDUR) 60 MG 24 hr tablet Take 60 mg by mouth at bedtime.  01/20/14  Yes Adrian Prows, MD  lisinopril-hydrochlorothiazide (PRINZIDE,ZESTORETIC) 20-25 MG per tablet Take 1 tablet by mouth daily. Take every morning 02/03/15  Yes Historical Provider, MD  simvastatin (ZOCOR) 20 MG tablet Take 20 mg by mouth at bedtime.  12/22/14  Yes Historical Provider, MD  trihexyphenidyl (ARTANE) 2 MG tablet Take 2 mg by mouth at bedtime.  08/25/13  Yes Historical Provider, MD  oxyCODONE (OXY IR/ROXICODONE) 5 MG immediate release tablet Take 1 tablet (5 mg total) by mouth every 6 (six) hours as needed for severe pain. Patient not taking: Reported on 02/05/2015 03/17/14   Alvia Grove, PA-C  oxyCODONE-acetaminophen (PERCOCET) 5-325 MG per tablet Take 2 tablets by mouth every 4 (four) hours as needed. Patient not taking: Reported on 02/05/2015 06/09/14   Starlyn Skeans, PA-C    No Known Allergies  Physical  Exam  Vitals  Blood pressure 95/49, pulse 96, temperature 97.4 F (36.3 C), temperature source Oral, resp. rate 19, SpO2 98 %.   1. General elderly male in no acute distress at this time, well-developed well-nourished  2. Normal affect and insight, Not Suicidal or Homicidal, Awake Alert, Oriented X 3.  3. No F.N deficits, grossly ALL C.Nerves Intact,   4. Ears and Eyes appear Normal, Conjunctivae clear, PERRLA. Moist Oral Mucosa.  5. Supple Neck, No JVD, No cervical lymphadenopathy appriciated, No Carotid Bruits.  6. Symmetrical Chest wall movement, Good air movement bilaterally, CTAB.  7. Irregularly irregular, No Gallops, Rubs or Murmurs, No Parasternal Heave.  8. Positive Bowel Sounds, Abdomen Soft, Non tender, No organomegaly appriciated,No rebound -guarding or rigidity.  9.  No Cyanosis, Normal Skin Turgor, No Skin Rash or Bruise.  10. Good muscle tone,  joints appear normal , no effusions, Normal ROM.  11. No Palpable Lymph Nodes in Neck or Axillae    Data Review  CBC  Recent Labs Lab 02/05/15 1341  WBC 7.7  HGB 14.6  HCT 42.3  PLT 280  MCV 88.9  MCH 30.7  MCHC 34.5  RDW 13.0   ------------------------------------------------------------------------------------------------------------------  Chemistries   Recent Labs Lab 02/05/15 1341  NA 133*  K 4.8  CL 101  CO2 20*  GLUCOSE 106*  BUN 82*  CREATININE 5.46*  CALCIUM 9.5   ------------------------------------------------------------------------------------------------------------------ CrCl cannot be calculated (Unknown ideal weight.). ------------------------------------------------------------------------------------------------------------------ No results for input(s): TSH, T4TOTAL, T3FREE, THYROIDAB in the last 72 hours.  Invalid input(s): FREET3   Coagulation profile No results for input(s): INR, PROTIME in the last 168  hours. ------------------------------------------------------------------------------------------------------------------- No results for input(s): DDIMER in the last 72 hours. -------------------------------------------------------------------------------------------------------------------  Cardiac Enzymes No results for input(s): CKMB, TROPONINI, MYOGLOBIN in the last 168 hours.  Invalid input(s): CK ------------------------------------------------------------------------------------------------------------------ Invalid input(s): POCBNP   ---------------------------------------------------------------------------------------------------------------  Urinalysis    Component Value Date/Time   COLORURINE YELLOW 02/05/2015 [place] 02/05/2015 1643   LABSPEC 1.018 02/05/2015 1643   PHURINE 5.0 02/05/2015 1643   GLUCOSEU NEGATIVE 02/05/2015 1643   HGBUR NEGATIVE 02/05/2015 1643   BILIRUBINUR NEGATIVE 02/05/2015 1643   KETONESUR NEGATIVE 02/05/2015 1643   PROTEINUR NEGATIVE 02/05/2015 1643   UROBILINOGEN 0.2 02/05/2015 1643   NITRITE NEGATIVE 02/05/2015 1643   LEUKOCYTESUR NEGATIVE 02/05/2015 1643    ----------------------------------------------------------------------------------------------------------------   Imaging results:   Dg Chest 2 View  02/05/2015   CLINICAL DATA:  Dizziness and fall.  EXAM: CHEST  2 VIEW  COMPARISON:  02/01/2014 and 01/19/2014  FINDINGS: The cardiomediastinal silhouette is unremarkable.  There is no evidence of focal airspace disease, pulmonary edema, suspicious pulmonary nodule/mass, pleural effusion, or pneumothorax. No acute bony abnormalities are identified.  IMPRESSION: No active cardiopulmonary disease.   Electronically Signed   By: Margarette Canada M.D.   On: 02/05/2015 17:07   Ct Head Wo Contrast  02/05/2015   CLINICAL DATA:  Dizziness, multiple falls this week.  EXAM: CT HEAD WITHOUT CONTRAST  CT CERVICAL SPINE WITHOUT CONTRAST   TECHNIQUE: Multidetector CT imaging of the head and cervical spine was performed following the standard protocol without intravenous contrast. Multiplanar CT image reconstructions of the cervical spine were also generated.  COMPARISON:  None.  FINDINGS: CT HEAD FINDINGS  Bony calvarium appears intact. Mild diffuse cortical atrophy is noted. Mild chronic ischemic white matter disease is noted. No mass effect or midline shift is noted. Ventricular size is within normal limits. There is no evidence of mass lesion, hemorrhage or acute infarction.  CT CERVICAL SPINE FINDINGS  No fracture or spondylolisthesis is noted. Anterior osteophyte formation is noted at all levels of the cervical spine. Degenerative changes seen involving the left-sided posterior facet joint at C5-6. Visualized lung apices appear normal.  IMPRESSION: Mild diffuse cortical atrophy. Mild chronic ischemic white  matter disease. No acute intracranial abnormality seen.  Mild degenerative changes are noted in the cervical spine. No fracture or spondylolisthesis is noted.   Electronically Signed   By: Marijo Conception, M.D.   On: 02/05/2015 16:42   Ct Cervical Spine Wo Contrast  02/05/2015   CLINICAL DATA:  Dizziness, multiple falls this week.  EXAM: CT HEAD WITHOUT CONTRAST  CT CERVICAL SPINE WITHOUT CONTRAST  TECHNIQUE: Multidetector CT imaging of the head and cervical spine was performed following the standard protocol without intravenous contrast. Multiplanar CT image reconstructions of the cervical spine were also generated.  COMPARISON:  None.  FINDINGS: CT HEAD FINDINGS  Bony calvarium appears intact. Mild diffuse cortical atrophy is noted. Mild chronic ischemic white matter disease is noted. No mass effect or midline shift is noted. Ventricular size is within normal limits. There is no evidence of mass lesion, hemorrhage or acute infarction.  CT CERVICAL SPINE FINDINGS  No fracture or spondylolisthesis is noted. Anterior osteophyte formation is  noted at all levels of the cervical spine. Degenerative changes seen involving the left-sided posterior facet joint at C5-6. Visualized lung apices appear normal.  IMPRESSION: Mild diffuse cortical atrophy. Mild chronic ischemic white matter disease. No acute intracranial abnormality seen.  Mild degenerative changes are noted in the cervical spine. No fracture or spondylolisthesis is noted.   Electronically Signed   By: Marijo Conception, M.D.   On: 02/05/2015 16:42    My personal review of EKG: Atrial fibrillation with heart rate around 96 bpm    Assessment & Plan   1. Acute renal failure probably medication induced with dehydration     Hold ACE inhibitor and diabetics     IV fluids     Renal ultrasound     No signs or symptoms of infection so we'll hold off anti-biotics     Consult nephrology in a.m.     Decrease Edoxaban due to renal failure 2. Atrial fibrillation on beta blockers and Edxaban 3. History of coronary artery disease 4. History of peripheral vascular disease 5. History of AAA    DVT Prophylaxis Edoxaban  AM Labs Ordered, also please review Full Orders  Code Status full  Disposition Plan: Home  Time spent in minutes : 34 minutes  Condition GUARDED   @SIGNATURE @

## 2015-02-05 NOTE — ED Provider Notes (Signed)
CSN: [id number]     Arrival date & time 02/05/15  1332 History   First MD Initiated Contact with Patient 02/05/15 1432     Chief Complaint  Patient presents with  . Dizziness  . Fall    HPI Patient is a 69 year old African-American male with a history of seizures, AAA status post repair, CAD and schizophrenia presenting for multiple episodes of dizziness over the past 2 weeks. He is on Plavix and has fallen multiple times. Dizzy episodes occur when he stands up or when he is lying down. He denies any injury or bleeding but has struck his head multiple times. Reports no loss of consciousness. Denies any dysuria, chest pain, shortness of breath, cough,fevers, chills. No alleviating factors for dizziness and has been gradually worsening.   Past Medical History  Diagnosis Date  . AAA (abdominal aortic aneurysm)   . Carotid artery occlusion   . Coronary artery disease   . Arthritis     "legs" (01/19/2014)  . Chronic back pain     "neck to tailbone" (01/19/2014)  . Hyperlipidemia     takes Zocor daily  . History of bronchitis 72yrs ago  . Seizures     has had one several yrs ago but never been on meds  . Stroke 04/16/1986    takes Plavix daily as well as Savaysa;right sided weakness  . Back pain     reason unknown  . Dysphagia   . History of colon polyps   . History of blood transfusion   . Schizophrenia     takes Haldol daily  . Dysrhythmia     Atrial Fib-takes Atenolol daily  . Hypertension     takes Clonidine,Apresoline,Imdur,and Lisinopril daily   Past Surgical History  Procedure Laterality Date  . Carpal tunnel release Right   . Eye surgery Bilateral     cataract surgery  . Colonoscopy    . Abdominal aortic endovascular stent graft N/A 02/01/2014    Procedure: ABDOMINAL AORTIC ENDOVASCULAR STENT GRAFT;  Surgeon: Elam Dutch, MD;  Location: [facility] OR;  Service: Vascular;  Laterality: N/A;  . Coronary angioplasty with stent placement  01/19/2014    "1"  . Endarterectomy Left  03/16/2014    Procedure: LEFT CAROTID ENDARTERECTOMY WITH PATCH ANGIOPLASTY ;  Surgeon: Elam Dutch, MD;  Location: [place];  Service: Vascular;  Laterality: Left;  . Left heart catheterization with coronary angiogram N/A 01/19/2014    Procedure: LEFT HEART CATHETERIZATION WITH CORONARY ANGIOGRAM;  Surgeon: Laverda Page, MD;  Location: [facility] CATH LAB;  Service: Cardiovascular;  Laterality: N/A;  . Percutaneous coronary stent intervention (pci-s)  01/19/2014    Procedure: PERCUTANEOUS CORONARY STENT INTERVENTION (PCI-S);  Surgeon: Laverda Page, MD;  Location: [facility] CATH LAB;  Service: Cardiovascular;;   Family History  Problem Relation Age of Onset  . Cancer Father   . Hyperlipidemia Father   . Hypertension Father   . Heart disease Sister   . AAA (abdominal aortic aneurysm) Sister    History  Substance Use Topics  . Smoking status: Former Smoker -- 1.00 packs/day for 58 years    Types: Cigarettes  . Smokeless tobacco: Never Used     Comment: quit smoking a couple of yrs ago  . Alcohol Use: No    Review of Systems  Constitutional: Negative for fever and chills.  HENT: Negative for congestion and sore throat.   Eyes: Negative for pain.  Respiratory: Negative for cough and shortness of breath.   Cardiovascular: Negative  for chest pain and palpitations.  Gastrointestinal: Negative for nausea, vomiting, abdominal pain and diarrhea.  Endocrine: Negative.   Genitourinary: Negative for dysuria and flank pain.  Musculoskeletal: Negative for back pain and neck pain.  Skin: Negative for rash.  Allergic/Immunologic: Negative.   Neurological: Positive for dizziness and weakness. Negative for seizures, syncope, light-headedness, numbness and headaches.  Psychiatric/Behavioral: Negative for confusion.   Allergies  Review of patient's allergies indicates no known allergies.  Home Medications   Prior to Admission medications   Medication Sig Start Date End Date Taking? Authorizing  Provider  atenolol (TENORMIN) 100 MG tablet Take 1 tablet (100 mg total) by mouth 2 (two) times daily. 01/20/14   Adrian Prows, MD  cloNIDine (CATAPRES) 0.1 MG tablet Take 0.1 mg by mouth daily.  10/14/13   Historical Provider, MD  clopidogrel (PLAVIX) 75 MG tablet Take 1 tablet (75 mg total) by mouth daily with breakfast. 01/20/14   Adrian Prows, MD  Edoxaban Tosylate (SAVAYSA) 60 MG TABS Take 60 mg by mouth daily.    Historical Provider, MD  haloperidol (HALDOL) 5 MG tablet Take 2.5 mg by mouth at bedtime.  08/30/13   Historical Provider, MD  hydrALAZINE (APRESOLINE) 50 MG tablet Take 50 mg by mouth daily. 01/20/14   Adrian Prows, MD  isosorbide mononitrate (IMDUR) 60 MG 24 hr tablet Take 60 mg by mouth at bedtime. 01/20/14   Adrian Prows, MD  lisinopril (PRINIVIL,ZESTRIL) 10 MG tablet Take 10 mg by mouth daily.    Historical Provider, MD  oxyCODONE (OXY IR/ROXICODONE) 5 MG immediate release tablet Take 1 tablet (5 mg total) by mouth every 6 (six) hours as needed for severe pain. 03/17/14   Alvia Grove, PA-C  oxyCODONE-acetaminophen (PERCOCET) 5-325 MG per tablet Take 2 tablets by mouth every 4 (four) hours as needed. 06/09/14   Courtney Forcucci, PA-C  simvastatin (ZOCOR) 40 MG tablet Take 40 mg by mouth at bedtime.     Historical Provider, MD  trihexyphenidyl (ARTANE) 2 MG tablet Take 2 mg by mouth daily.  08/25/13   Historical Provider, MD   BP 108/74 mmHg  Pulse 88  Temp(Src) 97.4 F (36.3 C) (Oral)  Resp 16  SpO2 99% Physical Exam  Constitutional: He is oriented to person, place, and time. He appears well-developed and well-nourished.  HENT:  Head: Normocephalic and atraumatic. Head is without raccoon's eyes and without Battle's sign.  No nasal septal hematomo or hyphema biaterally.   Eyes: Conjunctivae and EOM are normal. Pupils are equal, round, and reactive to light.  Neck: Normal range of motion. Neck supple. No spinous process tenderness and no muscular tenderness present.  Cardiovascular:  Normal rate, regular rhythm, S1 normal, S2 normal, normal heart sounds and intact distal pulses.   Pulses:      Radial pulses are 2+ on the right side, and 2+ on the left side.  Pulmonary/Chest: Effort normal and breath sounds normal. No respiratory distress.  Abdominal: Soft. Bowel sounds are normal. There is no tenderness.  Musculoskeletal: Normal range of motion.  Neurological: He is alert and oriented to person, place, and time. He has normal strength and normal reflexes. He displays normal reflexes. No cranial nerve deficit or sensory deficit. GCS eye subscore is 4. GCS verbal subscore is 5. GCS motor subscore is 6.  Mild right sided decreased grip strength.   Normal finger to nose bilaterally.  Rapid alternating movements intact bilaterally.  Normal heal to shin bilaterally.   No pronator drift bilaterally.  Skin: Skin is warm and dry.    ED Course  Procedures (including critical care time) Labs Review Labs Reviewed  BASIC METABOLIC PANEL - Abnormal; Notable for the following:    Sodium 133 (*)    CO2 20 (*)    Glucose, Bld 106 (*)    BUN 82 (*)    Creatinine, Ser 5.46 (*)    GFR calc non Af Amer 10 (*)    GFR calc Af Amer 11 (*)    All other components within normal limits  CBC  I-STAT TROPOININ, ED    Imaging Review No results found.   EKG Interpretation   Date/Time:  Saturday February 05 2015 13:36:36 EDT Ventricular Rate:  96 PR Interval:    QRS Duration: 98 QT Interval:  304 QTC Calculation: 384 R Axis:   50 Text Interpretation:  Atrial fibrillation Abnormal ECG no significant  change since June 2015 Confirmed by Regenia Skeeter  MD, SCOTT ([id number]) on 02/05/2015  2:32:53 PM      MDM   Final diagnoses:  None    Pt is a 69 yo male with a hx of AAA repair, dementia, seizures and CAD presenting for dizziness and multiple falls over the last two weeks.  Pt is poor historian and history obtained from son at bedside.  Pt denies any palpitations, CP, SOB, n/v.    On  evaluation right sided neurologic deficit present with mild weakness.  Otherwise no focal abnormalities on neuro exam.  CT head and c-spine with no acute abnormalities.  CBC with no anemia or leukocytosis.  UA with no signs of infection.  CXR showing no pneumonia.  Found to have AKI with Cr/BUN of 2.78/66.  Likely prerenal due to dehydration.  Fluids started in the ED. Doubt sepsis.  Also possible for pathology of AAA repair hindering arterial supply to kidneys.  Do not feel acute dissection requiring further imaging in the ED however due to lack of pain or symptoms otherwise.  Discussed with hospitalist Dr. Laren Everts who admitted pt for further tx.    If performed, labs, EKGs, and imaging were reviewed/interpreted by myself and my attending and incorporated into medical decision making.  Discussed pertinent finding with patient or caregiver prior to admission with no further questions.  Pt care supervised by my attending Dr. Vanita Panda.   Geronimo Boot, MD PGY-2  Emergency Medicine     Geronimo Boot, MD 02/06/15 [place], MD 02/07/15 [phone]

## 2015-02-05 NOTE — ED Notes (Signed)
Son stated, he's been dizzy and has fell a couple times this week.

## 2015-02-06 DIAGNOSIS — R55 Syncope and collapse: Secondary | ICD-10-CM

## 2015-02-06 DIAGNOSIS — I482 Chronic atrial fibrillation, unspecified: Secondary | ICD-10-CM

## 2015-02-06 DIAGNOSIS — N179 Acute kidney failure, unspecified: Principal | ICD-10-CM

## 2015-02-06 LAB — CBC
HCT: 39 % (ref 39.0–52.0)
Hemoglobin: 13.5 g/dL (ref 13.0–17.0)
MCH: 30.1 pg (ref 26.0–34.0)
MCHC: 34.6 g/dL (ref 30.0–36.0)
MCV: 86.9 fL (ref 78.0–100.0)
Platelets: 241 10*3/uL (ref 150–400)
RBC: 4.49 MIL/uL (ref 4.22–5.81)
RDW: 12.9 % (ref 11.5–15.5)
WBC: 6.2 10*3/uL (ref 4.0–10.5)

## 2015-02-06 LAB — BASIC METABOLIC PANEL
Anion gap: 8 (ref 5–15)
BUN: 66 mg/dL — AB (ref 6–20)
CHLORIDE: 109 mmol/L (ref 101–111)
CO2: 21 mmol/L — ABNORMAL LOW (ref 22–32)
Calcium: 9.2 mg/dL (ref 8.9–10.3)
Creatinine, Ser: 2.78 mg/dL — ABNORMAL HIGH (ref 0.61–1.24)
GFR calc Af Amer: 25 mL/min — ABNORMAL LOW (ref >60)
GFR calc non Af Amer: 22 mL/min — ABNORMAL LOW (ref >60)
GLUCOSE: 95 mg/dL (ref 65–99)
Potassium: 4.6 mmol/L (ref 3.5–5.1)
SODIUM: 138 mmol/L (ref 135–145)

## 2015-02-06 NOTE — Progress Notes (Signed)
[facility] PROGRESS NOTE  Assessment/Plan:  AKI (acute kidney injury) - Multifactorial likely due to medication. Hold ACE inhibitor and diuretic. - Start on IV hydration with improvements, renal ultrasound was done that showed no hydronephrosis or renal atrophy. Baseline creatinine around 1. Continue IV fluids.  Syncope: - Sounds more like orthostatic hypotension has already gone significant amount of fluid. Check orthostatics. - His blood pressure was borderline on admission over and hold his clonidine to thiazide, clonidine hydralazine and ACE inhibitor  Chronic atrial fibrillation - Rate controlled continue anticoagulation.     Code Status: full Family Communication: none  Disposition Plan: home in 1-2 days   Consultants:  none  Procedures:  RenalUS  Antibiotics:  None  HPI/Subjective: Currently she still feels lightheaded on standing.  Objective: Filed Vitals:   02/05/15 2000 02/05/15 2046 02/05/15 2057 02/06/15 0424  BP: 110/86  114/89 127/84  Pulse: 94  78 79  Temp:  98.7 F (37.1 C) 97 F (36.1 C) 97.7 F (36.5 C)  TempSrc:  Oral Oral Oral  Resp: 21  18 18   Height:   6\' 1"  (1.854 m)   Weight:   82.1 kg (181 lb)   SpO2: 97%  100% 99%    Intake/Output Summary (Last 24 hours) at 02/06/15 0754 Last data filed at 02/06/15 0400  Gross per 24 hour  Intake      0 ml  Output    880 ml  Net   -880 ml   Filed Weights   02/05/15 2057  Weight: 82.1 kg (181 lb)    Exam:  General: Alert, awake, oriented x3, in no acute distress.  HEENT: No bruits, no goiter.  Heart: Regular rate and rhythm. Lungs: Good air movement, clear Abdomen: Soft, nontender, nondistended, positive bowel sounds.  Neuro: Grossly intact, nonfocal.   Data Reviewed: Basic Metabolic Panel:  Recent Labs Lab 02/05/15 1341 02/06/15 0306  NA 133* 138  K 4.8 4.6  CL 101 109  CO2 20* 21*  GLUCOSE 106* 95  BUN 82* 66*  CREATININE 5.46* 2.78*  CALCIUM 9.5 9.2    Liver Function Tests:  Recent Labs Lab 02/05/15 2117  AST 22  ALT 20  ALKPHOS 67  BILITOT 0.4  PROT 7.1  ALBUMIN 3.5   No results for input(s): LIPASE, AMYLASE in the last 168 hours. No results for input(s): AMMONIA in the last 168 hours. CBC:  Recent Labs Lab 02/05/15 1341 02/06/15 0306  WBC 7.7 6.2  HGB 14.6 13.5  HCT 42.3 39.0  MCV 88.9 86.9  PLT 280 241   Cardiac Enzymes: No results for input(s): CKTOTAL, CKMB, CKMBINDEX, TROPONINI in the last 168 hours. BNP (last 3 results) No results for input(s): BNP in the last 8760 hours.  ProBNP (last 3 results) No results for input(s): PROBNP in the last 8760 hours.  CBG: No results for input(s): GLUCAP in the last 168 hours.  No results found for this or any previous visit (from the past 240 hour(s)).   Studies: Dg Chest 2 View  02/05/2015   CLINICAL DATA:  Dizziness and fall.  EXAM: CHEST  2 VIEW  COMPARISON:  02/01/2014 and 01/19/2014  FINDINGS: The cardiomediastinal silhouette is unremarkable.  There is no evidence of focal airspace disease, pulmonary edema, suspicious pulmonary nodule/mass, pleural effusion, or pneumothorax. No acute bony abnormalities are identified.  IMPRESSION: No active cardiopulmonary disease.   Electronically Signed   By: Margarette Canada M.D.   On: 02/05/2015 17:07   Ct Head Wo  Contrast  02/05/2015   CLINICAL DATA:  Dizziness, multiple falls this week.  EXAM: CT HEAD WITHOUT CONTRAST  CT CERVICAL SPINE WITHOUT CONTRAST  TECHNIQUE: Multidetector CT imaging of the head and cervical spine was performed following the standard protocol without intravenous contrast. Multiplanar CT image reconstructions of the cervical spine were also generated.  COMPARISON:  None.  FINDINGS: CT HEAD FINDINGS  Bony calvarium appears intact. Mild diffuse cortical atrophy is noted. Mild chronic ischemic white matter disease is noted. No mass effect or midline shift is noted. Ventricular size is within normal limits. There is no  evidence of mass lesion, hemorrhage or acute infarction.  CT CERVICAL SPINE FINDINGS  No fracture or spondylolisthesis is noted. Anterior osteophyte formation is noted at all levels of the cervical spine. Degenerative changes seen involving the left-sided posterior facet joint at C5-6. Visualized lung apices appear normal.  IMPRESSION: Mild diffuse cortical atrophy. Mild chronic ischemic white matter disease. No acute intracranial abnormality seen.  Mild degenerative changes are noted in the cervical spine. No fracture or spondylolisthesis is noted.   Electronically Signed   By: Marijo Conception, M.D.   On: 02/05/2015 16:42   Ct Cervical Spine Wo Contrast  02/05/2015   CLINICAL DATA:  Dizziness, multiple falls this week.  EXAM: CT HEAD WITHOUT CONTRAST  CT CERVICAL SPINE WITHOUT CONTRAST  TECHNIQUE: Multidetector CT imaging of the head and cervical spine was performed following the standard protocol without intravenous contrast. Multiplanar CT image reconstructions of the cervical spine were also generated.  COMPARISON:  None.  FINDINGS: CT HEAD FINDINGS  Bony calvarium appears intact. Mild diffuse cortical atrophy is noted. Mild chronic ischemic white matter disease is noted. No mass effect or midline shift is noted. Ventricular size is within normal limits. There is no evidence of mass lesion, hemorrhage or acute infarction.  CT CERVICAL SPINE FINDINGS  No fracture or spondylolisthesis is noted. Anterior osteophyte formation is noted at all levels of the cervical spine. Degenerative changes seen involving the left-sided posterior facet joint at C5-6. Visualized lung apices appear normal.  IMPRESSION: Mild diffuse cortical atrophy. Mild chronic ischemic white matter disease. No acute intracranial abnormality seen.  Mild degenerative changes are noted in the cervical spine. No fracture or spondylolisthesis is noted.   Electronically Signed   By: Marijo Conception, M.D.   On: 02/05/2015 16:42   US Renal  02/06/2015    CLINICAL DATA:  Renal failure  EXAM: RENAL / URINARY TRACT ULTRASOUND COMPLETE  COMPARISON:  Abdominal CT 09/30/2014  FINDINGS: Right Kidney:  Length: 11 cm. Echogenicity within normal limits. No mass or hydronephrosis visualized.  Left Kidney:  Length: 10 cm. Echogenicity within normal limits. No solid mass or hydronephrosis visualized. Exophytic 14 mm cyst arises from the lower pole, simple appearing.  Bladder:  Appears normal for degree of bladder distention.  IMPRESSION: No hydronephrosis or renal atrophy.   Electronically Signed   By: Monte Fantasia M.D.   On: 02/06/2015 00:36    Scheduled Meds: . sodium chloride   Intravenous STAT  . atenolol  100 mg Oral BID  . cloNIDine  0.1 mg Oral Daily  . clopidogrel  75 mg Oral Q breakfast  . edoxaban  30 mg Oral Daily  . haloperidol  2.5 mg Oral QHS  . hydrALAZINE  50 mg Oral TID  . isosorbide mononitrate  60 mg Oral QHS  . simvastatin  20 mg Oral QHS  . sodium chloride  3 mL Intravenous Q12H  .  trihexyphenidyl  2 mg Oral QHS   Continuous Infusions: . sodium chloride 100 mL/hr at 02/05/15 2249    Time Spent: 25 min   Charlynne Cousins  [facility] Pager [phone]. If 7PM-7AM, please contact night-coverage at [email or web address], password [facility] 02/06/2015, 7:54 AM  LOS: 1 day

## 2015-02-07 LAB — URINE CULTURE: Culture: NO GROWTH

## 2015-02-07 MED ORDER — SODIUM CHLORIDE 0.9 % IV BOLUS (SEPSIS)
250.0000 mL | Freq: Once | INTRAVENOUS | Status: AC
  Administered 2015-02-07: 250 mL via INTRAVENOUS

## 2015-02-07 MED ORDER — LISINOPRIL-HYDROCHLOROTHIAZIDE 20-25 MG PO TABS: 1.0000 | ORAL_TABLET | Freq: Every day | ORAL | Status: DC

## 2015-02-07 NOTE — Care Management Note (Signed)
Case Management Note  Patient Details  Name: Wesley [name] MRN: [id number] Date of Birth: [birth year]  Subjective/Objective:    Pt admitted with Acute Kidney Injury             Action/Plan:  Pt is independent from home with live in son.  CM will assess for disposition needs   Expected Discharge Date:  02/07/15               Expected Discharge Plan:  Home/Self Care  In-House Referral:     Discharge planning Services  CM Consult  Post Acute Care Choice:    Choice offered to:     DME Arranged:    DME Agency:     HH Arranged:    HH Agency:     Status of Service:  Completed, signed off  Medicare Important Message Given:  yes Date Medicare IM Given:  02/07/2015 Medicare IM give by:  Maryclare Labrador Date Additional Medicare IM Given:    Additional Medicare Important Message give by:     If discussed at [place] of Stay Meetings, dates discussed:    Additional Comments: CM assessed pt, pt is independent and states he feels comfortable going back home with son with [facility] or dme.  No CM needs  Maryclare Labrador, RN 02/07/2015, 11:30 AM

## 2015-02-07 NOTE — Care Management (Signed)
Important Message  Patient Details  Name: Wesley [name] MRN: [id number] Date of Birth: 11-Dec-1945   Medicare Important Message Given:  Yes-second notification given    Nathen May 02/07/2015, 2:09 PM

## 2015-02-07 NOTE — Discharge Summary (Signed)
Physician Discharge Summary  Wesley [name] VXB:[id number] DOB: 09/05/1945 DOA: 02/05/2015  PCP: Philis Fendt, MD  Admit date: 02/05/2015 Discharge date: 02/07/2015  Time spent: 35 minutes  Recommendations for Outpatient Follow-up:  1. Follow up with PCP in 1 week check a b-met and Blood pressure.   Discharge Diagnoses:  Active Problems:   AKI (acute kidney injury)   Syncope   Chronic atrial fibrillation   Discharge Condition: stable  Diet recommendation: heart healthy  Filed Weights   02/05/15 2057  Weight: 82.1 kg (181 lb)    History of present illness:  69 y.o. male, with past medical history significant for coronary artery disease, hypertension , AAA and schizophrenia presenting for 2 weeks history of dizziness episodes on and off. The dizziness starts when he takes his pills usually. It's worse when he stands up quickly, no history of fever chills nausea vomiting but reports mild diarrhea today. Patient lives at home with his son and has not been outside a lot. Patient denies any chest pains, palpitations, blood in the stools or his urine. In the emergency room he was found to have a creatinine of 5.46 and he was able to urinate without any trouble so a Foley is not indicated at this time.  Hospital Course:  AKI (acute kidney injury) - Multifactorial likely due to medication. Hold ACE inhibitor and diuretic. - Start on IV hydration with improvements, renal ultrasound was done that showed no hydronephrosis or renal atrophy. - Continue IV fluids. Cr close to baseline  Syncope: - Sounds more like orthostatic hypotension has already gotten significant amount of iv fluid. Orthostatic resolved. - His blood pressure was borderline on admission over and hold his clonidine to thiazide, clonidine hydralazine and ACE inhibitor. - resume in antihypertensive medications and to 3 days.  Chronic atrial fibrillation - Rate controlled continue anticoagulation.  Procedures:  Renal  US  Consultations:  none  Discharge Exam: Filed Vitals:   02/07/15 0448  BP: 144/94  Pulse: 74  Temp: 97.7 F (36.5 C)  Resp: 18    General: A&O x3 Cardiovascular: RRR Respiratory: good air movement CTA B/L  Discharge Instructions   Discharge Instructions    Diet - low sodium heart healthy    Complete by:  As directed      Increase activity slowly    Complete by:  As directed           Current Discharge Medication List    CONTINUE these medications which have CHANGED   Details  lisinopril-hydrochlorothiazide (PRINZIDE,ZESTORETIC) 20-25 MG per tablet Take 1 tablet by mouth daily. Take every morning      CONTINUE these medications which have NOT CHANGED   Details  atenolol (TENORMIN) 100 MG tablet Take 1 tablet (100 mg total) by mouth 2 (two) times daily. Qty: 180 tablet, Refills: 1    clopidogrel (PLAVIX) 75 MG tablet Take 1 tablet (75 mg total) by mouth daily with breakfast. Qty: 30 tablet, Refills: 6    Edoxaban Tosylate (SAVAYSA) 60 MG TABS Take 60 mg by mouth daily.    haloperidol (HALDOL) 5 MG tablet Take 2.5 mg by mouth at bedtime.     hydrALAZINE (APRESOLINE) 50 MG tablet Take 50 mg by mouth 3 (three) times daily.     isosorbide mononitrate (IMDUR) 60 MG 24 hr tablet Take 60 mg by mouth at bedtime.     simvastatin (ZOCOR) 20 MG tablet Take 20 mg by mouth at bedtime.  Refills: 3    trihexyphenidyl (ARTANE) 2 MG  tablet Take 2 mg by mouth at bedtime.     oxyCODONE (OXY IR/ROXICODONE) 5 MG immediate release tablet Take 1 tablet (5 mg total) by mouth every 6 (six) hours as needed for severe pain. Qty: 20 tablet, Refills: 0    oxyCODONE-acetaminophen (PERCOCET) 5-325 MG per tablet Take 2 tablets by mouth every 4 (four) hours as needed. Qty: 6 tablet, Refills: 0      STOP taking these medications     cloNIDine (CATAPRES) 0.1 MG tablet        No Known Allergies Follow-up Information    Follow up with AVBUERE,EDWIN A, MD In 2 weeks.   Specialty:   Internal Medicine   Why:  hospital follow up   Contact information:   [place] [address] [phone]        The results of significant diagnostics from this hospitalization (including imaging, microbiology, ancillary and laboratory) are listed below for reference.    Significant Diagnostic Studies: Dg Chest 2 View  02/05/2015   CLINICAL DATA:  Dizziness and fall.  EXAM: CHEST  2 VIEW  COMPARISON:  02/01/2014 and 01/19/2014  FINDINGS: The cardiomediastinal silhouette is unremarkable.  There is no evidence of focal airspace disease, pulmonary edema, suspicious pulmonary nodule/mass, pleural effusion, or pneumothorax. No acute bony abnormalities are identified.  IMPRESSION: No active cardiopulmonary disease.   Electronically Signed   By: Margarette Canada M.D.   On: 02/05/2015 17:07   Ct Head Wo Contrast  02/05/2015   CLINICAL DATA:  Dizziness, multiple falls this week.  EXAM: CT HEAD WITHOUT CONTRAST  CT CERVICAL SPINE WITHOUT CONTRAST  TECHNIQUE: Multidetector CT imaging of the head and cervical spine was performed following the standard protocol without intravenous contrast. Multiplanar CT image reconstructions of the cervical spine were also generated.  COMPARISON:  None.  FINDINGS: CT HEAD FINDINGS  Bony calvarium appears intact. Mild diffuse cortical atrophy is noted. Mild chronic ischemic white matter disease is noted. No mass effect or midline shift is noted. Ventricular size is within normal limits. There is no evidence of mass lesion, hemorrhage or acute infarction.  CT CERVICAL SPINE FINDINGS  No fracture or spondylolisthesis is noted. Anterior osteophyte formation is noted at all levels of the cervical spine. Degenerative changes seen involving the left-sided posterior facet joint at C5-6. Visualized lung apices appear normal.  IMPRESSION: Mild diffuse cortical atrophy. Mild chronic ischemic white matter disease. No acute intracranial abnormality seen.  Mild degenerative  changes are noted in the cervical spine. No fracture or spondylolisthesis is noted.   Electronically Signed   By: Marijo Conception, M.D.   On: 02/05/2015 16:42   Ct Cervical Spine Wo Contrast  02/05/2015   CLINICAL DATA:  Dizziness, multiple falls this week.  EXAM: CT HEAD WITHOUT CONTRAST  CT CERVICAL SPINE WITHOUT CONTRAST  TECHNIQUE: Multidetector CT imaging of the head and cervical spine was performed following the standard protocol without intravenous contrast. Multiplanar CT image reconstructions of the cervical spine were also generated.  COMPARISON:  None.  FINDINGS: CT HEAD FINDINGS  Bony calvarium appears intact. Mild diffuse cortical atrophy is noted. Mild chronic ischemic white matter disease is noted. No mass effect or midline shift is noted. Ventricular size is within normal limits. There is no evidence of mass lesion, hemorrhage or acute infarction.  CT CERVICAL SPINE FINDINGS  No fracture or spondylolisthesis is noted. Anterior osteophyte formation is noted at all levels of the cervical spine. Degenerative changes seen involving the left-sided posterior facet joint  at C5-6. Visualized lung apices appear normal.  IMPRESSION: Mild diffuse cortical atrophy. Mild chronic ischemic white matter disease. No acute intracranial abnormality seen.  Mild degenerative changes are noted in the cervical spine. No fracture or spondylolisthesis is noted.   Electronically Signed   By: Marijo Conception, M.D.   On: 02/05/2015 16:42   US Renal  02/06/2015   CLINICAL DATA:  Renal failure  EXAM: RENAL / URINARY TRACT ULTRASOUND COMPLETE  COMPARISON:  Abdominal CT 09/30/2014  FINDINGS: Right Kidney:  Length: 11 cm. Echogenicity within normal limits. No mass or hydronephrosis visualized.  Left Kidney:  Length: 10 cm. Echogenicity within normal limits. No solid mass or hydronephrosis visualized. Exophytic 14 mm cyst arises from the lower pole, simple appearing.  Bladder:  Appears normal for degree of bladder distention.   IMPRESSION: No hydronephrosis or renal atrophy.   Electronically Signed   By: Monte Fantasia M.D.   On: 02/06/2015 00:36    Microbiology: Recent Results (from the past 240 hour(s))  Blood culture (routine x 2)     Status: None (Preliminary result)   Collection Time: 02/05/15  3:30 PM  Result Value Ref Range Status   Specimen Description BLOOD RIGHT HAND  Final   Special Requests BOTTLES DRAWN AEROBIC AND ANAEROBIC 3 CC  Final   Culture NO GROWTH 1 DAY  Final   Report Status PENDING  Incomplete  Blood culture (routine x 2)     Status: None (Preliminary result)   Collection Time: 02/05/15  3:35 PM  Result Value Ref Range Status   Specimen Description BLOOD LEFT ARM  Final   Special Requests BOTTLES DRAWN AEROBIC AND ANAEROBIC 5 CC  Final   Culture NO GROWTH 1 DAY  Final   Report Status PENDING  Incomplete  Urine culture     Status: None (Preliminary result)   Collection Time: 02/05/15  4:43 PM  Result Value Ref Range Status   Specimen Description URINE, RANDOM  Final   Special Requests NONE  Final   Culture NO GROWTH < 24 HOURS  Final   Report Status PENDING  Incomplete     Labs: Basic Metabolic Panel:  Recent Labs Lab 02/05/15 1341 02/06/15 0306  NA 133* 138  K 4.8 4.6  CL 101 109  CO2 20* 21*  GLUCOSE 106* 95  BUN 82* 66*  CREATININE 5.46* 2.78*  CALCIUM 9.5 9.2   Liver Function Tests:  Recent Labs Lab 02/05/15 2117  AST 22  ALT 20  ALKPHOS 67  BILITOT 0.4  PROT 7.1  ALBUMIN 3.5   No results for input(s): LIPASE, AMYLASE in the last 168 hours. No results for input(s): AMMONIA in the last 168 hours. CBC:  Recent Labs Lab 02/05/15 1341 02/06/15 0306  WBC 7.7 6.2  HGB 14.6 13.5  HCT 42.3 39.0  MCV 88.9 86.9  PLT 280 241   Cardiac Enzymes: No results for input(s): CKTOTAL, CKMB, CKMBINDEX, TROPONINI in the last 168 hours. BNP: BNP (last 3 results) No results for input(s): BNP in the last 8760 hours.  ProBNP (last 3 results) No results for  input(s): PROBNP in the last 8760 hours.  CBG: No results for input(s): GLUCAP in the last 168 hours.     Signed:  Charlynne Cousins  [facility] 02/07/2015, 8:16 AM

## 2015-02-07 NOTE — Progress Notes (Signed)
[facility] PROGRESS NOTE  Assessment/Plan:  AKI (acute kidney injury) - Multifactorial likely due to medication. Hold ACE inhibitor and diuretic. - Start on IV hydration with improvements, renal ultrasound was done that showed no hydronephrosis or renal atrophy. - Continue IV fluids. b-met pending.  Syncope: - Sounds more like orthostatic hypotension has already gotten significant amount of iv fluid. Check orthostatics. - His blood pressure was borderline on admission over and hold his clonidine to thiazide, clonidine hydralazine and ACE inhibitor  Chronic atrial fibrillation - Rate controlled continue anticoagulation.     Code Status: full Family Communication: none  Disposition Plan: home in today   Consultants:  none  Procedures:  Renal US  Antibiotics:  None  HPI/Subjective: No complain  Objective: Filed Vitals:   02/06/15 0424 02/06/15 1432 02/06/15 2124 02/07/15 0448  BP: 127/84 111/70 146/83 144/94  Pulse: 79 88 71 74  Temp: 97.7 F (36.5 C) 98.2 F (36.8 C) 97.9 F (36.6 C) 97.7 F (36.5 C)  TempSrc: Oral Oral Oral Oral  Resp: $Remo'18 17 16 18  'FVhTx$ Height:      Weight:      SpO2: 99% 98% 100% 100%   No intake or output data in the 24 hours ending 02/07/15 0811 Filed Weights   02/05/15 2057  Weight: 82.1 kg (181 lb)    Exam:  General: Alert, awake, oriented x3, in no acute distress.  HEENT: No bruits, no goiter.  Heart: Regular rate and rhythm. Lungs: Good air movement, clear Abdomen: Soft, nontender, nondistended, positive bowel sounds.  Neuro: Grossly intact, nonfocal.   Data Reviewed: Basic Metabolic Panel:  Recent Labs Lab 02/05/15 1341 02/06/15 0306  NA 133* 138  K 4.8 4.6  CL 101 109  CO2 20* 21*  GLUCOSE 106* 95  BUN 82* 66*  CREATININE 5.46* 2.78*  CALCIUM 9.5 9.2   Liver Function Tests:  Recent Labs Lab 02/05/15 2117  AST 22  ALT 20  ALKPHOS 67  BILITOT 0.4  PROT 7.1  ALBUMIN 3.5   No results for  input(s): LIPASE, AMYLASE in the last 168 hours. No results for input(s): AMMONIA in the last 168 hours. CBC:  Recent Labs Lab 02/05/15 1341 02/06/15 0306  WBC 7.7 6.2  HGB 14.6 13.5  HCT 42.3 39.0  MCV 88.9 86.9  PLT 280 241   Cardiac Enzymes: No results for input(s): CKTOTAL, CKMB, CKMBINDEX, TROPONINI in the last 168 hours. BNP (last 3 results) No results for input(s): BNP in the last 8760 hours.  ProBNP (last 3 results) No results for input(s): PROBNP in the last 8760 hours.  CBG: No results for input(s): GLUCAP in the last 168 hours.  Recent Results (from the past 240 hour(s))  Blood culture (routine x 2)     Status: None (Preliminary result)   Collection Time: 02/05/15  3:30 PM  Result Value Ref Range Status   Specimen Description BLOOD RIGHT HAND  Final   Special Requests BOTTLES DRAWN AEROBIC AND ANAEROBIC 3 CC  Final   Culture NO GROWTH 1 DAY  Final   Report Status PENDING  Incomplete  Blood culture (routine x 2)     Status: None (Preliminary result)   Collection Time: 02/05/15  3:35 PM  Result Value Ref Range Status   Specimen Description BLOOD LEFT ARM  Final   Special Requests BOTTLES DRAWN AEROBIC AND ANAEROBIC 5 CC  Final   Culture NO GROWTH 1 DAY  Final   Report Status PENDING  Incomplete  Urine  culture     Status: None (Preliminary result)   Collection Time: 02/05/15  4:43 PM  Result Value Ref Range Status   Specimen Description URINE, RANDOM  Final   Special Requests NONE  Final   Culture NO GROWTH < 24 HOURS  Final   Report Status PENDING  Incomplete     Studies: Dg Chest 2 View  02/05/2015   CLINICAL DATA:  Dizziness and fall.  EXAM: CHEST  2 VIEW  COMPARISON:  02/01/2014 and 01/19/2014  FINDINGS: The cardiomediastinal silhouette is unremarkable.  There is no evidence of focal airspace disease, pulmonary edema, suspicious pulmonary nodule/mass, pleural effusion, or pneumothorax. No acute bony abnormalities are identified.  IMPRESSION: No active  cardiopulmonary disease.   Electronically Signed   By: Margarette Canada M.D.   On: 02/05/2015 17:07   Ct Head Wo Contrast  02/05/2015   CLINICAL DATA:  Dizziness, multiple falls this week.  EXAM: CT HEAD WITHOUT CONTRAST  CT CERVICAL SPINE WITHOUT CONTRAST  TECHNIQUE: Multidetector CT imaging of the head and cervical spine was performed following the standard protocol without intravenous contrast. Multiplanar CT image reconstructions of the cervical spine were also generated.  COMPARISON:  None.  FINDINGS: CT HEAD FINDINGS  Bony calvarium appears intact. Mild diffuse cortical atrophy is noted. Mild chronic ischemic white matter disease is noted. No mass effect or midline shift is noted. Ventricular size is within normal limits. There is no evidence of mass lesion, hemorrhage or acute infarction.  CT CERVICAL SPINE FINDINGS  No fracture or spondylolisthesis is noted. Anterior osteophyte formation is noted at all levels of the cervical spine. Degenerative changes seen involving the left-sided posterior facet joint at C5-6. Visualized lung apices appear normal.  IMPRESSION: Mild diffuse cortical atrophy. Mild chronic ischemic white matter disease. No acute intracranial abnormality seen.  Mild degenerative changes are noted in the cervical spine. No fracture or spondylolisthesis is noted.   Electronically Signed   By: Marijo Conception, M.D.   On: 02/05/2015 16:42   Ct Cervical Spine Wo Contrast  02/05/2015   CLINICAL DATA:  Dizziness, multiple falls this week.  EXAM: CT HEAD WITHOUT CONTRAST  CT CERVICAL SPINE WITHOUT CONTRAST  TECHNIQUE: Multidetector CT imaging of the head and cervical spine was performed following the standard protocol without intravenous contrast. Multiplanar CT image reconstructions of the cervical spine were also generated.  COMPARISON:  None.  FINDINGS: CT HEAD FINDINGS  Bony calvarium appears intact. Mild diffuse cortical atrophy is noted. Mild chronic ischemic white matter disease is noted. No  mass effect or midline shift is noted. Ventricular size is within normal limits. There is no evidence of mass lesion, hemorrhage or acute infarction.  CT CERVICAL SPINE FINDINGS  No fracture or spondylolisthesis is noted. Anterior osteophyte formation is noted at all levels of the cervical spine. Degenerative changes seen involving the left-sided posterior facet joint at C5-6. Visualized lung apices appear normal.  IMPRESSION: Mild diffuse cortical atrophy. Mild chronic ischemic white matter disease. No acute intracranial abnormality seen.  Mild degenerative changes are noted in the cervical spine. No fracture or spondylolisthesis is noted.   Electronically Signed   By: Marijo Conception, M.D.   On: 02/05/2015 16:42   US Renal  02/06/2015   CLINICAL DATA:  Renal failure  EXAM: RENAL / URINARY TRACT ULTRASOUND COMPLETE  COMPARISON:  Abdominal CT 09/30/2014  FINDINGS: Right Kidney:  Length: 11 cm. Echogenicity within normal limits. No mass or hydronephrosis visualized.  Left Kidney:  Length: 10  cm. Echogenicity within normal limits. No solid mass or hydronephrosis visualized. Exophytic 14 mm cyst arises from the lower pole, simple appearing.  Bladder:  Appears normal for degree of bladder distention.  IMPRESSION: No hydronephrosis or renal atrophy.   Electronically Signed   By: Monte Fantasia M.D.   On: 02/06/2015 00:36    Scheduled Meds: . atenolol  100 mg Oral BID  . clopidogrel  75 mg Oral Q breakfast  . edoxaban  30 mg Oral Daily  . haloperidol  2.5 mg Oral QHS  . simvastatin  20 mg Oral QHS  . sodium chloride  3 mL Intravenous Q12H  . trihexyphenidyl  2 mg Oral QHS   Continuous Infusions: . sodium chloride 100 mL/hr at 02/05/15 2249    Time Spent: 25 min   Charlynne Cousins  [facility] Pager [phone]. If 7PM-7AM, please contact night-coverage at [email or web address], password [facility] 02/07/2015, 8:11 AM  LOS: 2 days

## 2015-02-10 LAB — CULTURE, BLOOD (ROUTINE X 2)
Culture: NO GROWTH
Culture: NO GROWTH

## 2015-02-28 DIAGNOSIS — I482 Chronic atrial fibrillation: Secondary | ICD-10-CM | POA: Diagnosis not present

## 2015-02-28 DIAGNOSIS — Z87828 Personal history of other (healed) physical injury and trauma: Secondary | ICD-10-CM | POA: Diagnosis not present

## 2015-02-28 DIAGNOSIS — I10 Essential (primary) hypertension: Secondary | ICD-10-CM | POA: Diagnosis not present

## 2015-02-28 DIAGNOSIS — I25119 Atherosclerotic heart disease of native coronary artery with unspecified angina pectoris: Secondary | ICD-10-CM | POA: Diagnosis not present

## 2015-03-01 DIAGNOSIS — E780 Pure hypercholesterolemia: Secondary | ICD-10-CM | POA: Diagnosis not present

## 2015-03-01 DIAGNOSIS — I10 Essential (primary) hypertension: Secondary | ICD-10-CM | POA: Diagnosis not present

## 2015-03-28 DIAGNOSIS — F205 Residual schizophrenia: Secondary | ICD-10-CM | POA: Diagnosis not present

## 2015-03-31 ENCOUNTER — Ambulatory Visit: Payer: Medicare Other | Admitting: Podiatry

## 2015-04-06 ENCOUNTER — Ambulatory Visit (INDEPENDENT_AMBULATORY_CARE_PROVIDER_SITE_OTHER): Payer: Medicare Other | Admitting: Podiatry

## 2015-04-06 VITALS — BP 106/69 | HR 59 | Resp 16

## 2015-04-06 DIAGNOSIS — M79676 Pain in unspecified toe(s): Secondary | ICD-10-CM | POA: Diagnosis not present

## 2015-04-06 DIAGNOSIS — B351 Tinea unguium: Secondary | ICD-10-CM

## 2015-04-06 NOTE — Progress Notes (Signed)
Patient ID: Wesley [name], male   DOB: September 24, 1945, 69 y.o.   MRN: [id number] Complaint:  Visit Type: Patient returns to my office for continued preventative foot care services. Complaint: Patient states" my nails have grown long and thick and become painful to walk and wear shoes" . He presents for preventative foot care services. No changes to ROS  Podiatric Exam: Vascular: dorsalis pedis and posterior tibial pulses are palpable bilateral. Capillary return is immediate. Temperature gradient is WNL. Skin turgor WNL  Sensorium: Normal Semmes Weinstein monofilament test. Normal tactile sensation bilaterally. Nail Exam: Pt has thick disfigured discolored nails with subungual debris noted bilateral entire nail hallux through fifth toenails Ulcer Exam: There is no evidence of ulcer or pre-ulcerative changes or infection. Orthopedic Exam: Muscle tone and strength are WNL. No limitations in general ROM. No crepitus or effusions noted. Foot type and digits show no abnormalities. Bony prominences are unremarkable. Skin: No Porokeratosis. No infection or ulcers.  .  Diagnosis:  Tinea unguium, Pain in right toe, pain in left toes.  .  Treatment & Plan Procedures and Treatment: Consent by patient was obtained for treatment procedures. The patient understood the discussion of treatment and procedures well. All questions were answered thoroughly reviewed. Debridement of mycotic and hypertrophic toenails, 1 through 5 bilateral and clearing of subungual debris. No ulceration, no infection noted.  Return Visit-Office Procedure: Patient instructed to return to the office for a follow up visit 3 months for continued evaluation and treatment.

## 2015-04-11 DIAGNOSIS — I25119 Atherosclerotic heart disease of native coronary artery with unspecified angina pectoris: Secondary | ICD-10-CM | POA: Diagnosis not present

## 2015-04-11 DIAGNOSIS — I10 Essential (primary) hypertension: Secondary | ICD-10-CM | POA: Diagnosis not present

## 2015-04-11 DIAGNOSIS — Z87828 Personal history of other (healed) physical injury and trauma: Secondary | ICD-10-CM | POA: Diagnosis not present

## 2015-04-11 DIAGNOSIS — I482 Chronic atrial fibrillation: Secondary | ICD-10-CM | POA: Diagnosis not present

## 2015-04-12 ENCOUNTER — Encounter: Payer: Self-pay | Admitting: Vascular Surgery

## 2015-04-14 ENCOUNTER — Ambulatory Visit: Payer: Medicare Other | Admitting: Vascular Surgery

## 2015-04-14 ENCOUNTER — Ambulatory Visit
Admission: RE | Admit: 2015-04-14 | Discharge: 2015-04-14 | Disposition: A | Discharge status: Home / Self Care | Payer: Medicare Other | Source: Ambulatory Visit | Attending: Vascular Surgery | Admitting: Vascular Surgery

## 2015-04-14 ENCOUNTER — Ambulatory Visit (HOSPITAL_COMMUNITY): Payer: Medicare Other

## 2015-04-14 DIAGNOSIS — I714 Abdominal aortic aneurysm, without rupture, unspecified: Secondary | ICD-10-CM

## 2015-04-20 DIAGNOSIS — E784 Other hyperlipidemia: Secondary | ICD-10-CM | POA: Diagnosis not present

## 2015-04-20 DIAGNOSIS — R7301 Impaired fasting glucose: Secondary | ICD-10-CM | POA: Diagnosis not present

## 2015-04-20 DIAGNOSIS — G20 Parkinson's disease: Secondary | ICD-10-CM | POA: Diagnosis not present

## 2015-04-20 DIAGNOSIS — I10 Essential (primary) hypertension: Secondary | ICD-10-CM | POA: Diagnosis not present

## 2015-04-20 DIAGNOSIS — I4892 Unspecified atrial flutter: Secondary | ICD-10-CM | POA: Diagnosis not present

## 2015-05-17 ENCOUNTER — Encounter: Payer: Self-pay | Admitting: Vascular Surgery

## 2015-05-18 ENCOUNTER — Ambulatory Visit (INDEPENDENT_AMBULATORY_CARE_PROVIDER_SITE_OTHER)
Admission: RE | Admit: 2015-05-18 | Discharge: 2015-05-18 | Disposition: A | Discharge status: Home / Self Care | Payer: Medicare Other | Source: Ambulatory Visit | Attending: Vascular Surgery | Admitting: Vascular Surgery

## 2015-05-18 ENCOUNTER — Ambulatory Visit (HOSPITAL_COMMUNITY)
Admission: RE | Admit: 2015-05-18 | Discharge: 2015-05-18 | Disposition: A | Discharge status: Home / Self Care | Payer: Medicare Other | Source: Ambulatory Visit | Attending: Vascular Surgery | Admitting: Vascular Surgery

## 2015-05-18 DIAGNOSIS — E785 Hyperlipidemia, unspecified: Secondary | ICD-10-CM | POA: Diagnosis not present

## 2015-05-18 DIAGNOSIS — I6523 Occlusion and stenosis of bilateral carotid arteries: Secondary | ICD-10-CM

## 2015-05-18 DIAGNOSIS — I10 Essential (primary) hypertension: Secondary | ICD-10-CM | POA: Insufficient documentation

## 2015-05-18 DIAGNOSIS — I714 Abdominal aortic aneurysm, without rupture, unspecified: Secondary | ICD-10-CM

## 2015-05-19 ENCOUNTER — Encounter: Payer: Self-pay | Admitting: Vascular Surgery

## 2015-05-19 ENCOUNTER — Ambulatory Visit (INDEPENDENT_AMBULATORY_CARE_PROVIDER_SITE_OTHER): Payer: Medicare Other | Admitting: Vascular Surgery

## 2015-05-19 VITALS — BP 122/77 | HR 75 | Temp 98.0°F | Resp 16 | Ht 73.0 in | Wt 189.0 lb

## 2015-05-19 DIAGNOSIS — I6523 Occlusion and stenosis of bilateral carotid arteries: Secondary | ICD-10-CM | POA: Diagnosis not present

## 2015-05-19 DIAGNOSIS — I6529 Occlusion and stenosis of unspecified carotid artery: Secondary | ICD-10-CM | POA: Diagnosis not present

## 2015-05-19 DIAGNOSIS — I714 Abdominal aortic aneurysm, without rupture, unspecified: Secondary | ICD-10-CM

## 2015-05-19 NOTE — Progress Notes (Signed)
HISTORY AND PHYSICAL     CC:  Follow up carotid duplex scan and CTA for EVAR  Referring Provider:  Philis Fendt, MD  HPI: Patient is a 69 year old male who returns for follow-up today. He previously underwent left carotid endarterectomy August 2015. He also underwent endovascular aneurysm repair of an aortic aneurysm July 2015. He denies any abdominal or back pain. He denies any episodes of TIA amaurosis or stroke. He is taking aspirin daily. He is also on savaysa.  Review of systems: He denies shortness of breath or chest pain.      Past Medical History   Diagnosis  Date   .  AAA (abdominal aortic aneurysm)     .  Carotid artery occlusion     .  Coronary artery disease     .  Arthritis         "legs" (01/19/2014)   .  Chronic back pain         "neck to tailbone" (01/19/2014)   .  Hyperlipidemia         takes Zocor daily   .  History of bronchitis  68yrs ago   .  Seizures         has had one several yrs ago but never been on meds   .  Stroke  04/16/1986       takes Plavix daily as well as Savaysa;right sided weakness   .  Back pain         reason unknown   .  Dysphagia     .  History of colon polyps     .  History of blood transfusion     .  Schizophrenia         takes Haldol daily   .  Dysrhythmia         Atrial Fib-takes Atenolol daily   .  Hypertension         takes Clonidine,Apresoline,Imdur,and Lisinopril daily       Past Surgical History   Procedure  Laterality  Date   .  Carpal tunnel release  Right     .  Eye surgery  Bilateral         cataract surgery   .  Colonoscopy       .  Abdominal aortic endovascular stent graft  N/A  02/01/2014       Procedure: ABDOMINAL AORTIC ENDOVASCULAR STENT GRAFT;  Surgeon: Elam Dutch, MD;  Location[facility] OR;  Service: Vascular;  Laterality: N/A;   .  Coronary angioplasty with stent placement    01/19/2014       "1"   .  Endarterectomy  Left  03/16/2014       Procedure: LEFT CAROTID ENDARTERECTOMY WITH PATCH ANGIOPLASTY ;   Surgeon: Elam Dutch, MD;  Location: [place];  Service: Vascular;  Laterality: Left;   .  Left heart catheterization with coronary angiogram  N/A  01/19/2014       Procedure: LEFT HEART CATHETERIZATION WITH CORONARY ANGIOGRAM;  Surgeon: Laverda Page, MD;  Location: [facility] CATH LAB;  Service: Cardiovascular;  Laterality: N/A;   .  Percutaneous coronary stent intervention (pci-s)    01/19/2014       Procedure: PERCUTANEOUS CORONARY STENT INTERVENTION (PCI-S);  Surgeon: Laverda Page, MD;  Location: [facility] CATH LAB;  Service: Cardiovascular;;     No Known Allergies    Current Outpatient Prescriptions on File Prior to Visit  Medication Sig Dispense Refill  . atenolol (  TENORMIN) 100 MG tablet Take 1 tablet (100 mg total) by mouth 2 (two) times daily. 180 tablet 1  . clopidogrel (PLAVIX) 75 MG tablet Take 1 tablet (75 mg total) by mouth daily with breakfast. 30 tablet 6  . Edoxaban Tosylate (SAVAYSA) 60 MG TABS Take 60 mg by mouth daily.    . hydrALAZINE (APRESOLINE) 50 MG tablet Take 50 mg by mouth 3 (three) times daily.     . isosorbide mononitrate (IMDUR) 60 MG 24 hr tablet Take 60 mg by mouth at bedtime.     Marland Kitchen lisinopril-hydrochlorothiazide (PRINZIDE,ZESTORETIC) 20-25 MG per tablet Take 1 tablet by mouth daily. Take every morning    . oxyCODONE-acetaminophen (PERCOCET) 5-325 MG per tablet Take 2 tablets by mouth every 4 (four) hours as needed. 6 tablet 0  . simvastatin (ZOCOR) 20 MG tablet Take 20 mg by mouth at bedtime.   3  . trihexyphenidyl (ARTANE) 2 MG tablet Take 2 mg by mouth at bedtime.     . haloperidol (HALDOL) 5 MG tablet Take 2.5 mg by mouth at bedtime.     Marland Kitchen oxyCODONE (OXY IR/ROXICODONE) 5 MG immediate release tablet Take 1 tablet (5 mg total) by mouth every 6 (six) hours as needed for severe pain. (Patient not taking: Reported on 02/05/2015) 20 tablet 0   No current facility-administered medications on file prior to visit.    Pt's meds include: Statin:  Yes.   Beta Blocker:   Yes.   Aspirin: No. Other antiplatelets/anticoagulants:  Yes.   Plavix     Family History   Problem  Relation  Age of Onset   .  Cancer  Father     .  Hyperlipidemia  Father     .  Hypertension  Father     .  Heart disease  Sister     .  AAA (abdominal aortic aneurysm)  Sister         History      Social History   .  Marital Status:  Widowed       Spouse Name:  N/A   .  Number of Children:  N/A   .  Years of Education:  N/A      Occupational History   .  Not on file.      Social History Main Topics   .  Smoking status:  Former Smoker -- 1.00 packs/day for 58 years       Types:  Cigarettes   .  Smokeless tobacco:  Never Used         Comment: quit smoking a couple of yrs ago   .  Alcohol Use:  No   .  Drug Use:  No   .  Sexual Activity:  Not on file      Other Topics  Concern   .  Not on file      Social History Narrative      ROS: [x]  Positive   [ ]  Negative   [ ]  All sytems reviewed and are negative  Cardiovascular: []  chest pain/pressure []  palpitations []  SOB lying flat []  DOE []  pain in legs while walking []  pain in feet when lying flat []  hx of DVT []  hx of phlebitis []  swelling in legs []  varicose veins  Pulmonary: []  productive cough []  asthma []  wheezing  Neurologic: []  weakness in []  arms []  legs []  numbness in []  arms []  legs [] difficulty speaking or slurred speech []  temporary loss of vision in one eye []   dizziness  Hematologic: [x]  bleeding problems-recent nosebleed (visit to ER) []  problems with blood clotting easily  GI []  vomiting blood []  blood in stool  GU: []  burning with urination []  blood in urine  Psychiatric: []  hx of major depression  Integumentary: []  rashes []  ulcers  Constitutional: []  fever []  chills   PHYSICAL EXAMINATION:    Filed Vitals:   05/19/15 1000 05/19/15 1003  BP: 129/84 122/77  Pulse: 75 75  Temp:  98 F (36.7 C)  TempSrc:  Oral  Resp:  16  Height:  6\' 1"  (1.854 m)  Weight:  189  lb (85.73 kg)  SpO2:  100%    General:  WDWN in NAD Neck: No carotid bruits well-healed left neck incision  Pulmonary: Clear to auscultation bilaterally  Cardiac: Regular rate and rhythm  Abdomen: soft, NT, no masses Skin: without rashes,  without ulcers  Vascular Exam/Pulses: 2+ femoral pulses 2+ left posterior tibial pulse absent right pedal pulses  Extremities: without ischemic changes, without Gangrene , without cellulitis; without open wounds;      Neurologic: A&O X 3, symmetric upper and lower extremity motor strength which is 5 over 5  Non-Invasive Vascular Imaging: Carotid Duplex Scan:  I reviewed and interpreted these images. Right side 40-60% left no significant recurrent stenosis.  The patient also had an ultrasound of his abdominal aorta to assess his stent graft. His aneurysm has shrunk from 5.7 cm in diameter to 3.5 cm in diameter, no endoleak  ASSESSMENT: 69 y.o. male s/p left CEA 03/16/14 and EVAR 02/01/14 overall doing well   PLAN:  He will follow-up in one year with repeat carotid duplex exam and an ultrasound of his aortic stent graft.    Ruta Hinds, MD [facility] Office: [phone] Pager: [phone]

## 2015-05-24 NOTE — Addendum Note (Signed)
Addended by: Dorthula Rue L on: 05/24/2015 05:05 PM   Modules accepted: Orders

## 2015-06-15 IMAGING — CR DG CHEST 2V
2 series · 2 of 2 positions shown · non-contrast
Comparison: None

CLINICAL DATA: Preop evaluation for GOGGA, history hypertension,
asthma

EXAM:
CHEST  2 VIEW

[w chest pa]
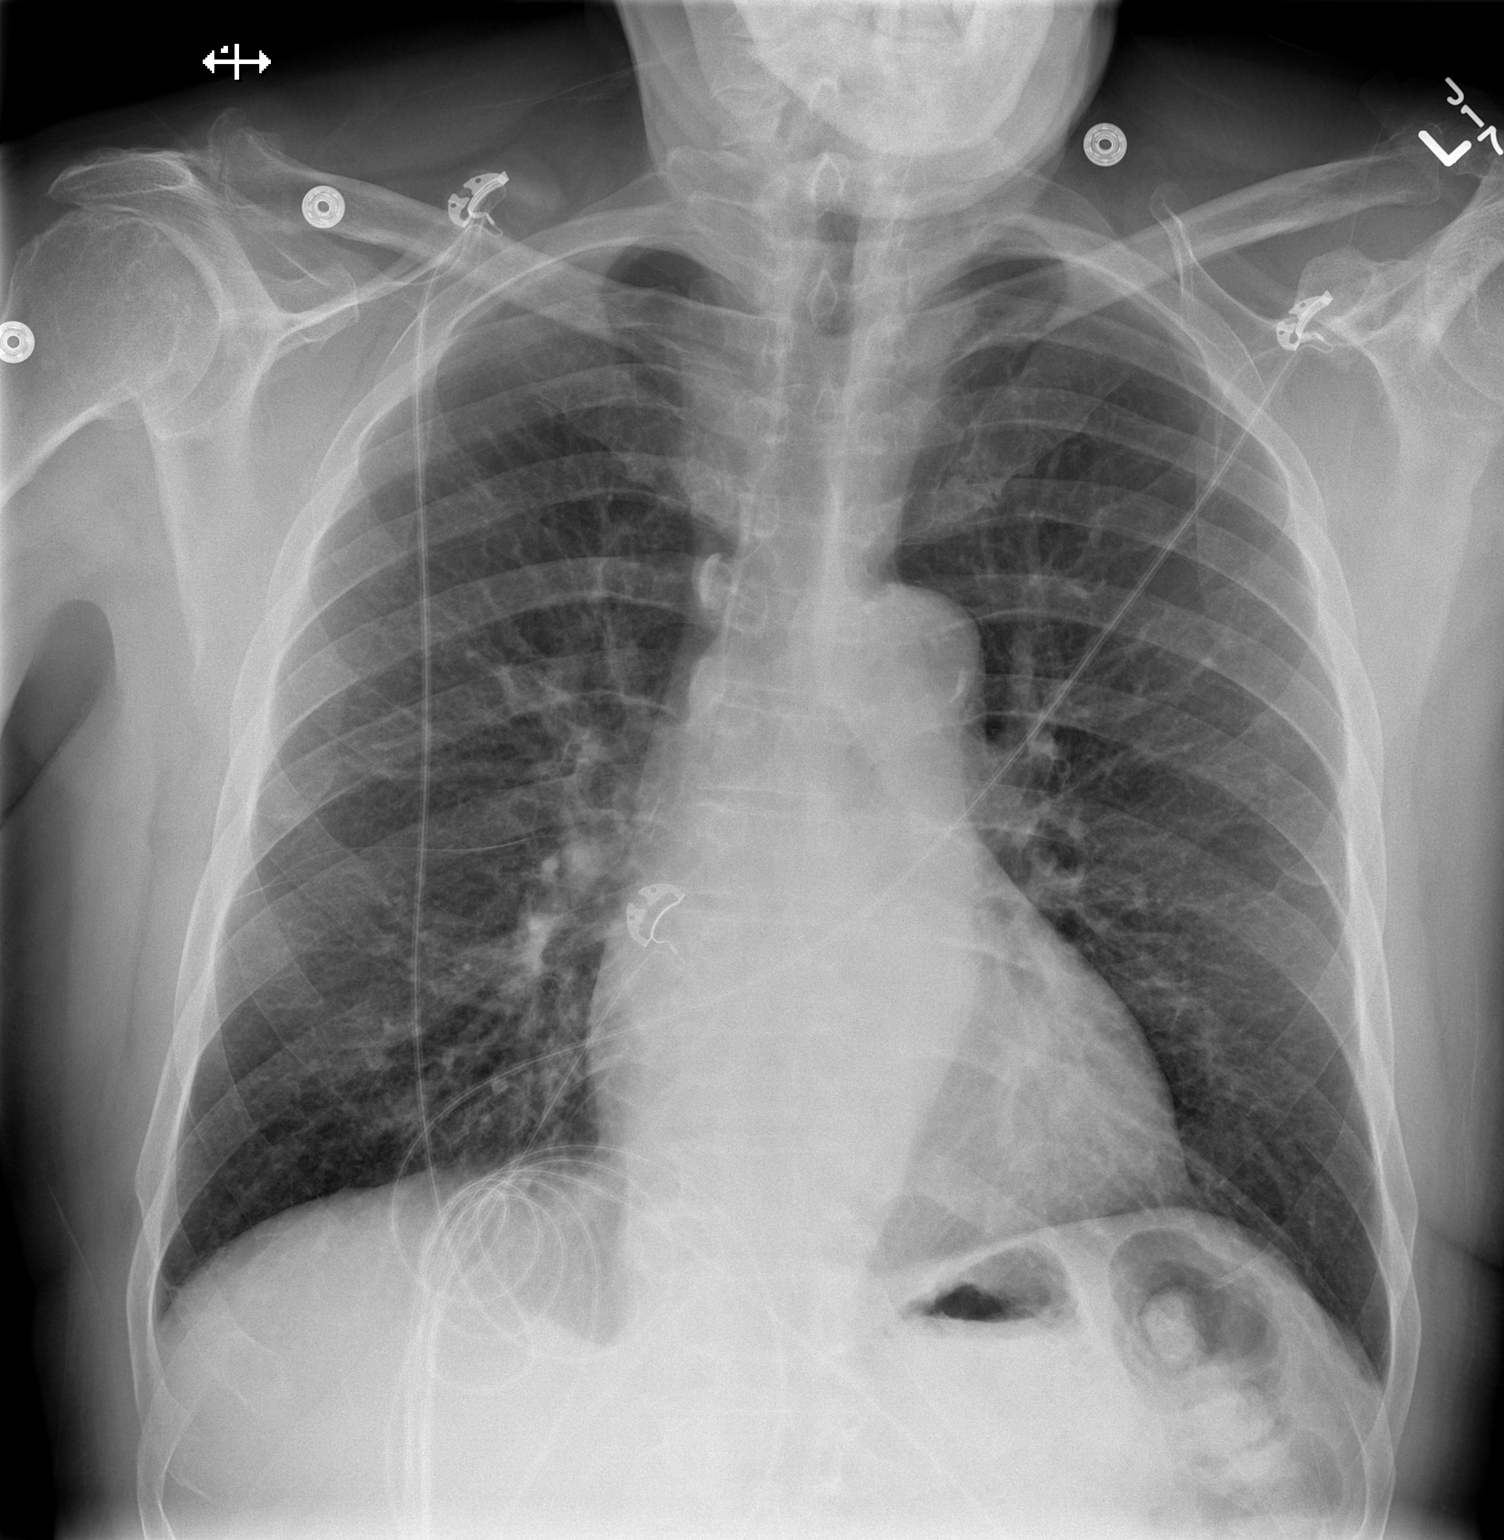

[w chest lat]
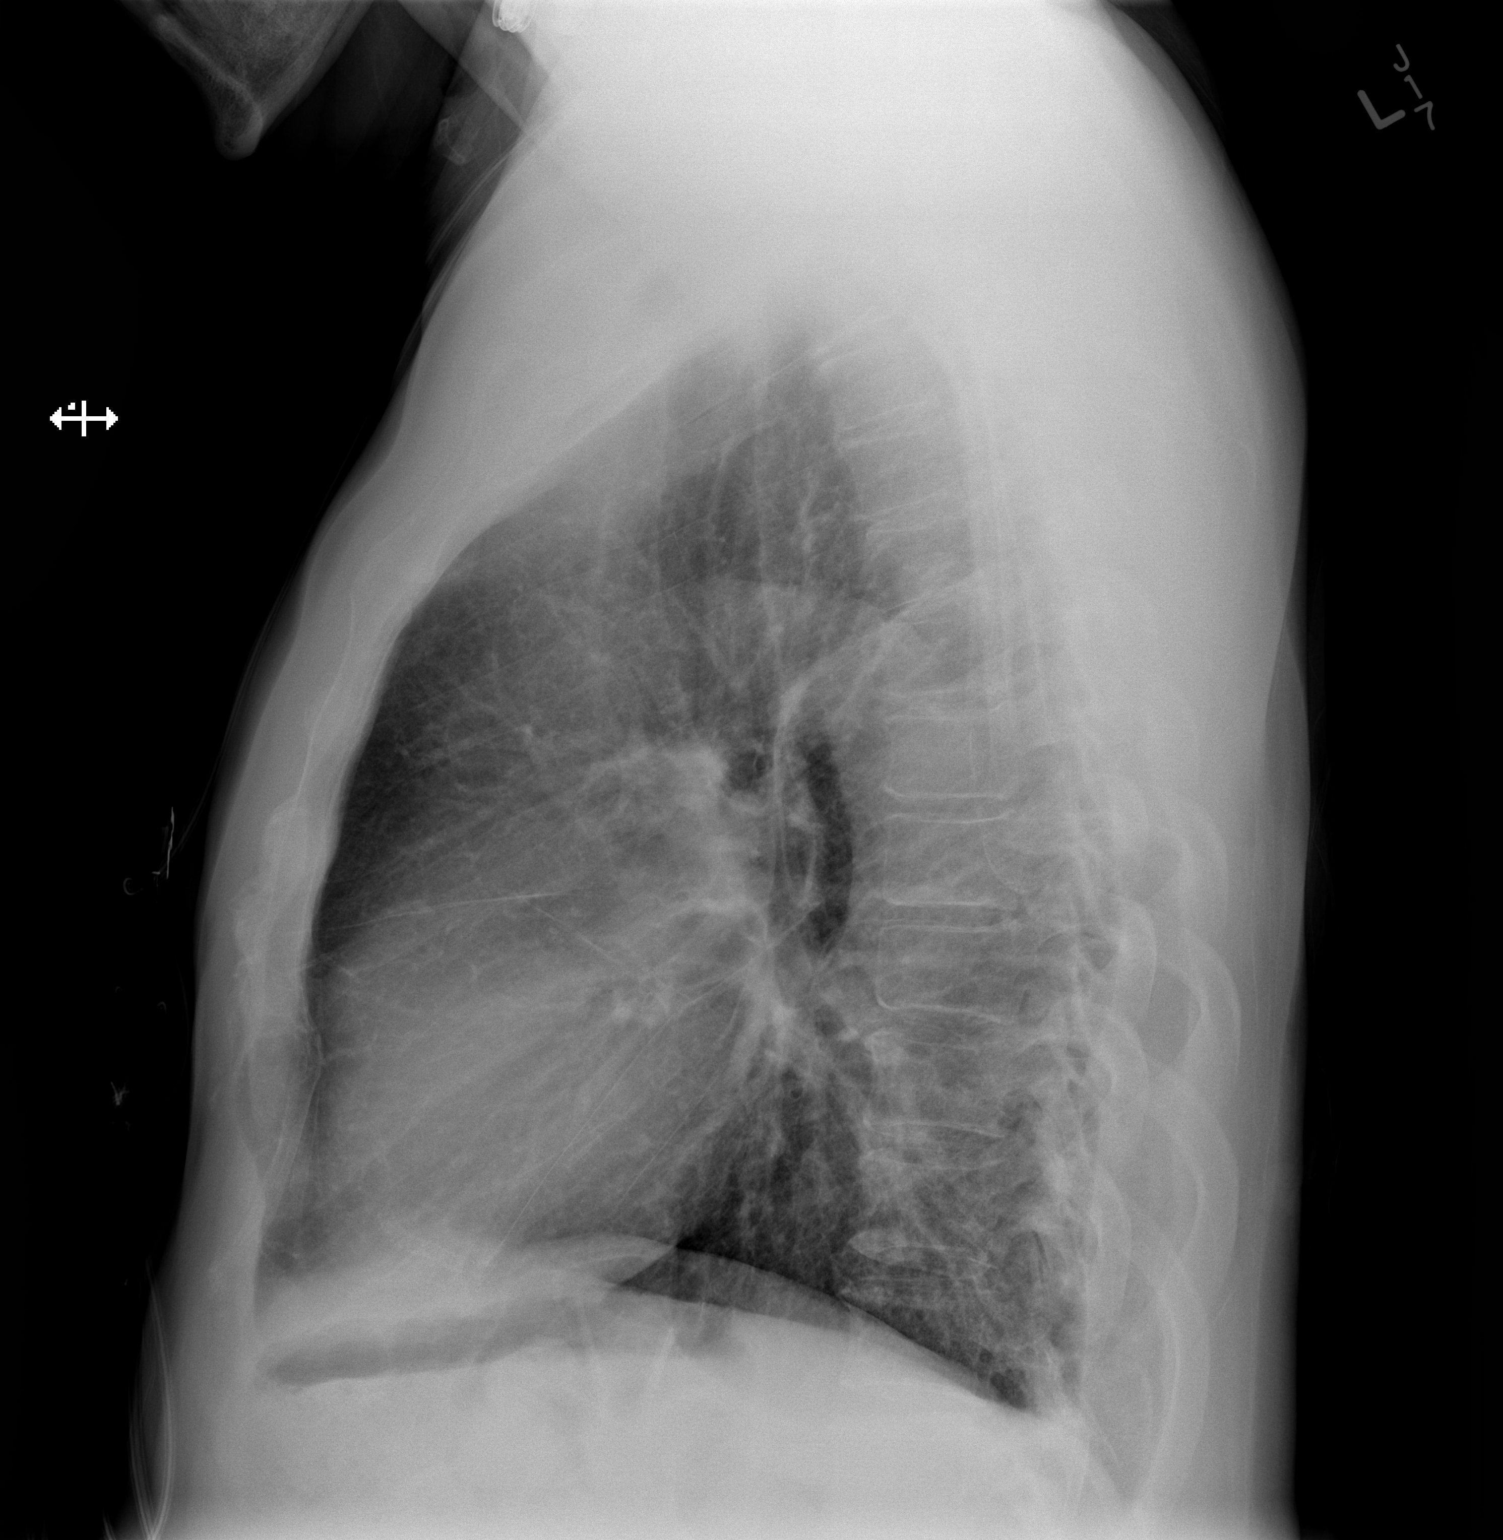

[2 of 2 positions shown; findings below may reference images not displayed]

FINDINGS: Upper normal heart size.

Calcified mildly tortuous thoracic aorta.

Pulmonary vascularity normal.

Minimal bronchitic changes.

No pulmonary infiltrate, pleural effusion or pneumothorax.

Bones diffusely demineralized.
IMPRESSION: Minimal bronchitic changes.

## 2015-07-07 ENCOUNTER — Encounter: Payer: Self-pay | Admitting: Podiatry

## 2015-07-07 ENCOUNTER — Ambulatory Visit (INDEPENDENT_AMBULATORY_CARE_PROVIDER_SITE_OTHER): Payer: Medicare Other | Admitting: Podiatry

## 2015-07-07 DIAGNOSIS — B351 Tinea unguium: Secondary | ICD-10-CM | POA: Diagnosis not present

## 2015-07-07 DIAGNOSIS — M79673 Pain in unspecified foot: Secondary | ICD-10-CM

## 2015-07-07 NOTE — Progress Notes (Signed)
Patient ID: Wesley [name], male   DOB: March 22, 1946, 69 y.o.   MRN: [id number] Complaint:  Visit Type: Patient returns to my office for continued preventative foot care services. Complaint: Patient states" my nails have grown long and thick and become painful to walk and wear shoes" . He presents for preventative foot care services. No changes to ROS  Podiatric Exam: Vascular: dorsalis pedis and posterior tibial pulses are palpable bilateral. Capillary return is immediate. Temperature gradient is WNL. Skin turgor WNL  Sensorium: Normal Semmes Weinstein monofilament test. Normal tactile sensation bilaterally. Nail Exam: Pt has thick disfigured discolored nails with subungual debris noted bilateral entire nail hallux through fifth toenails Ulcer Exam: There is no evidence of ulcer or pre-ulcerative changes or infection. Orthopedic Exam: Muscle tone and strength are WNL. No limitations in general ROM. No crepitus or effusions noted. Foot type and digits show no abnormalities. Bony prominences are unremarkable. Skin: No Porokeratosis. No infection or ulcers.  .  Diagnosis:  Tinea unguium, Pain in right toe, pain in left toes.  .  Treatment & Plan Procedures and Treatment: Consent by patient was obtained for treatment procedures. The patient understood the discussion of treatment and procedures well. All questions were answered thoroughly reviewed. Debridement of mycotic and hypertrophic toenails, 1 through 5 bilateral and clearing of subungual debris. No ulceration, no infection noted.  Return Visit-Office Procedure: Patient instructed to return to the office for a follow up visit 3 months for continued evaluation and treatment.  Gardiner Barefoot DPM

## 2015-08-19 DIAGNOSIS — I4892 Unspecified atrial flutter: Secondary | ICD-10-CM | POA: Diagnosis not present

## 2015-08-19 DIAGNOSIS — R7301 Impaired fasting glucose: Secondary | ICD-10-CM | POA: Diagnosis not present

## 2015-08-19 DIAGNOSIS — E784 Other hyperlipidemia: Secondary | ICD-10-CM | POA: Diagnosis not present

## 2015-08-19 DIAGNOSIS — I10 Essential (primary) hypertension: Secondary | ICD-10-CM | POA: Diagnosis not present

## 2015-08-19 DIAGNOSIS — G20 Parkinson's disease: Secondary | ICD-10-CM | POA: Diagnosis not present

## 2015-08-22 DIAGNOSIS — F205 Residual schizophrenia: Secondary | ICD-10-CM | POA: Diagnosis not present

## 2015-10-05 ENCOUNTER — Ambulatory Visit: Payer: Medicare Other | Admitting: Podiatry

## 2015-10-12 DIAGNOSIS — E7800 Pure hypercholesterolemia, unspecified: Secondary | ICD-10-CM | POA: Diagnosis not present

## 2015-10-12 DIAGNOSIS — I10 Essential (primary) hypertension: Secondary | ICD-10-CM | POA: Diagnosis not present

## 2015-10-12 DIAGNOSIS — I482 Chronic atrial fibrillation: Secondary | ICD-10-CM | POA: Diagnosis not present

## 2015-10-12 DIAGNOSIS — I25119 Atherosclerotic heart disease of native coronary artery with unspecified angina pectoris: Secondary | ICD-10-CM | POA: Diagnosis not present

## 2015-11-16 ENCOUNTER — Ambulatory Visit: Payer: Medicare Other | Admitting: Podiatry

## 2015-11-17 ENCOUNTER — Encounter: Payer: Self-pay | Admitting: Podiatry

## 2015-11-17 ENCOUNTER — Ambulatory Visit (INDEPENDENT_AMBULATORY_CARE_PROVIDER_SITE_OTHER): Payer: Medicare Other | Admitting: Podiatry

## 2015-11-17 DIAGNOSIS — Q828 Other specified congenital malformations of skin: Secondary | ICD-10-CM

## 2015-11-17 DIAGNOSIS — B351 Tinea unguium: Secondary | ICD-10-CM

## 2015-11-17 DIAGNOSIS — M79676 Pain in unspecified toe(s): Secondary | ICD-10-CM | POA: Diagnosis not present

## 2015-11-17 NOTE — Progress Notes (Signed)
Patient ID: Wesley [name], male   DOB: 1945/08/02, 70 y.o.   MRN: [id number] Complaint:  Visit Type: Patient returns to my office for continued preventative foot care services. Complaint: Patient states" my nails have grown long and thick and become painful to walk and wear shoes" . He presents for preventative foot care services. No changes to ROS  Podiatric Exam: Vascular: dorsalis pedis and posterior tibial pulses are palpable bilateral. Capillary return is immediate. Temperature gradient is WNL. Skin turgor WNL  Sensorium: Normal Semmes Weinstein monofilament test. Normal tactile sensation bilaterally. Nail Exam: Pt has thick disfigured discolored nails with subungual debris noted bilateral entire nail hallux through fifth toenails Ulcer Exam: There is no evidence of ulcer or pre-ulcerative changes or infection. Orthopedic Exam: Muscle tone and strength are WNL. No limitations in general ROM. No crepitus or effusions noted. Foot type and digits show no abnormalities. Bony prominences are unremarkable. Skin:  Porokeratosis sub 5th metatarsal right foot.. No infection or ulcers.  .  Diagnosis:  Tinea unguium, Pain in right toe, pain in left toes.  .  Treatment & Plan Procedures and Treatment: Consent by patient was obtained for treatment procedures. The patient understood the discussion of treatment and procedures well. All questions were answered thoroughly reviewed. Debridement of mycotic and hypertrophic toenails, 1 through 5 bilateral and clearing of subungual debris. No ulceration, no infection noted.  Return Visit-Office Procedure: Patient instructed to return to the office for a follow up visit 3 months for continued evaluation and treatment.  Gardiner Barefoot DPM

## 2015-12-22 ENCOUNTER — Encounter: Payer: Self-pay | Admitting: Cardiology

## 2016-01-02 ENCOUNTER — Encounter: Payer: Self-pay | Admitting: Cardiology

## 2016-01-19 DIAGNOSIS — F205 Residual schizophrenia: Secondary | ICD-10-CM | POA: Diagnosis not present

## 2016-02-16 ENCOUNTER — Ambulatory Visit: Payer: Medicare Other | Admitting: Podiatry

## 2016-02-29 ENCOUNTER — Encounter: Payer: Self-pay | Admitting: Podiatry

## 2016-02-29 ENCOUNTER — Ambulatory Visit (INDEPENDENT_AMBULATORY_CARE_PROVIDER_SITE_OTHER): Payer: Medicare Other | Admitting: Podiatry

## 2016-02-29 DIAGNOSIS — B351 Tinea unguium: Secondary | ICD-10-CM | POA: Diagnosis not present

## 2016-02-29 DIAGNOSIS — M79676 Pain in unspecified toe(s): Secondary | ICD-10-CM

## 2016-02-29 NOTE — Progress Notes (Signed)
Patient ID: Wesley [name], male   DOB: 1945/08/02, 70 y.o.   MRN: [id number] Complaint:  Visit Type: Patient returns to my office for continued preventative foot care services. Complaint: Patient states" my nails have grown long and thick and become painful to walk and wear shoes" . He presents for preventative foot care services. No changes to ROS  Podiatric Exam: Vascular: dorsalis pedis and posterior tibial pulses are palpable bilateral. Capillary return is immediate. Temperature gradient is WNL. Skin turgor WNL  Sensorium: Normal Semmes Weinstein monofilament test. Normal tactile sensation bilaterally. Nail Exam: Pt has thick disfigured discolored nails with subungual debris noted bilateral entire nail hallux through fifth toenails Ulcer Exam: There is no evidence of ulcer or pre-ulcerative changes or infection. Orthopedic Exam: Muscle tone and strength are WNL. No limitations in general ROM. No crepitus or effusions noted. Foot type and digits show no abnormalities. Bony prominences are unremarkable. Skin:  Porokeratosis sub 5th metatarsal right foot.. No infection or ulcers.  .  Diagnosis:  Tinea unguium, Pain in right toe, pain in left toes.  .  Treatment & Plan Procedures and Treatment: Consent by patient was obtained for treatment procedures. The patient understood the discussion of treatment and procedures well. All questions were answered thoroughly reviewed. Debridement of mycotic and hypertrophic toenails, 1 through 5 bilateral and clearing of subungual debris. No ulceration, no infection noted.  Return Visit-Office Procedure: Patient instructed to return to the office for a follow up visit 3 months for continued evaluation and treatment.  Gardiner Barefoot DPM

## 2016-05-03 IMAGING — US US RENAL
1 series · 14 of 25 positions shown · non-contrast
Comparison: Abdominal CT 09/30/2014

CLINICAL DATA: Renal failure

EXAM:
RENAL / URINARY TRACT ULTRASOUND COMPLETE

[Series 1: us renal · 0.22mm/px · 14 of 29 slices shown]
[im 1/29]
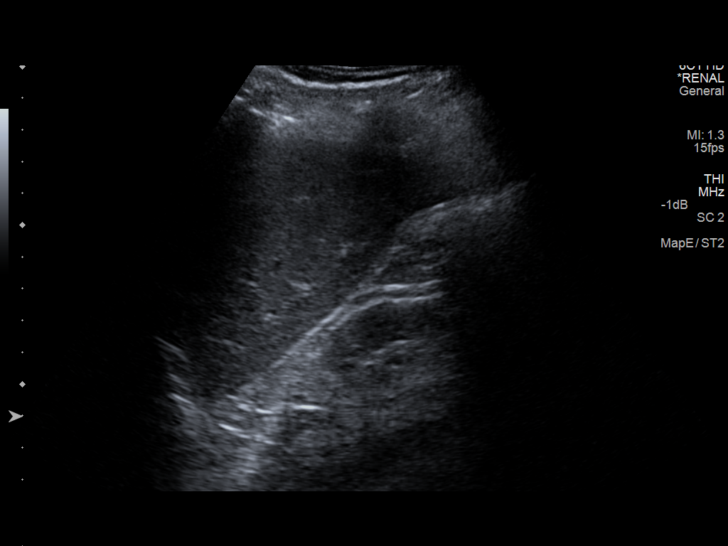
[im 3/29]
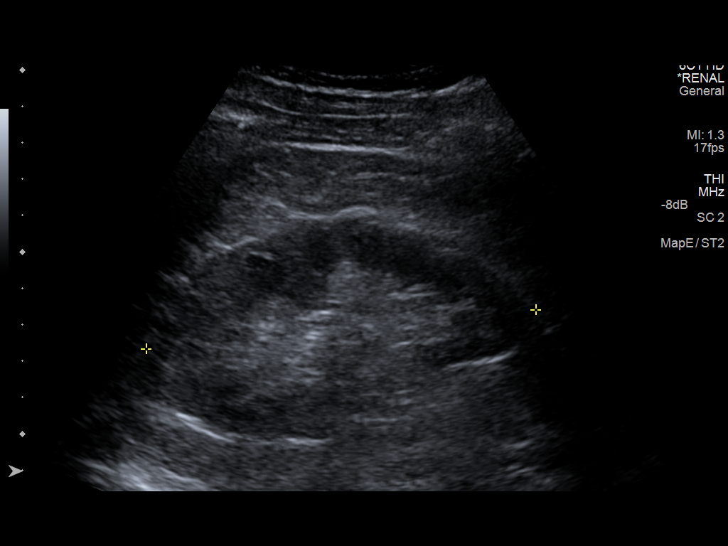
[im 5/29]
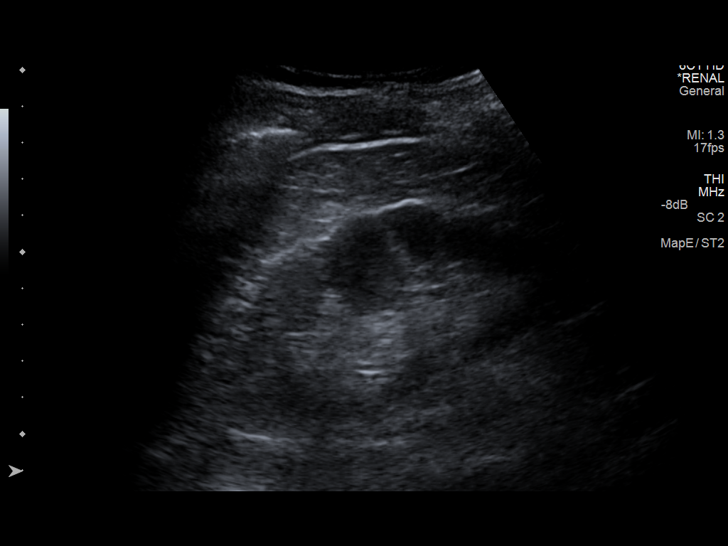
[im 8/29]
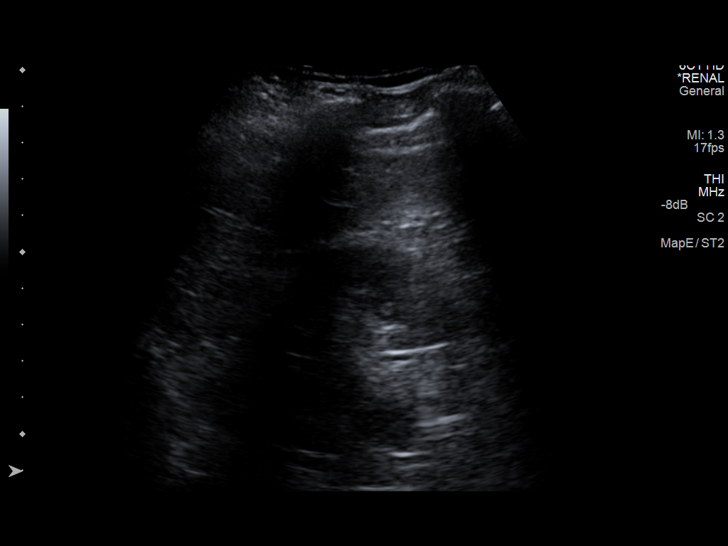
[im 10/29]
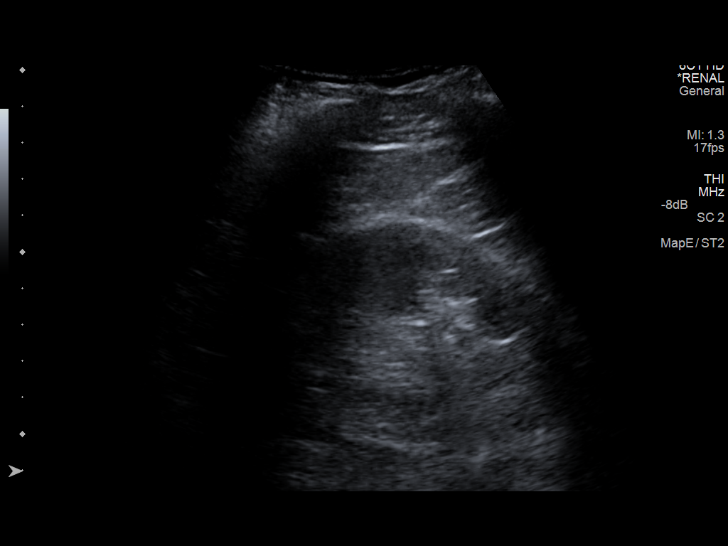
[im 11/29]
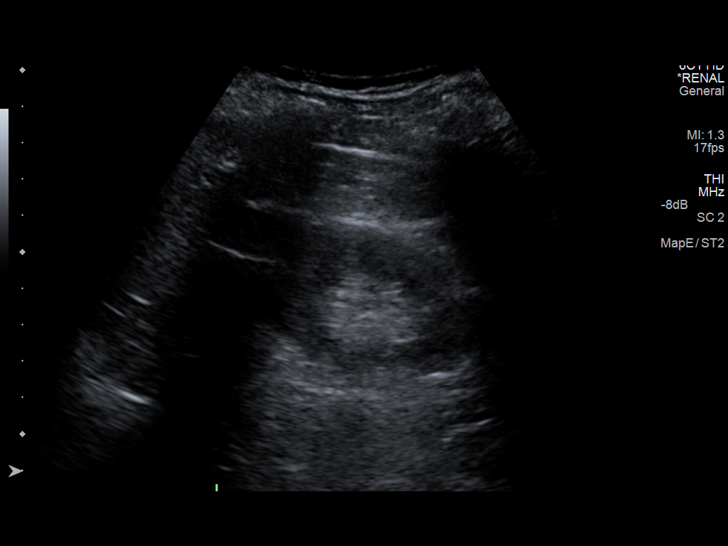
[im 13/29]
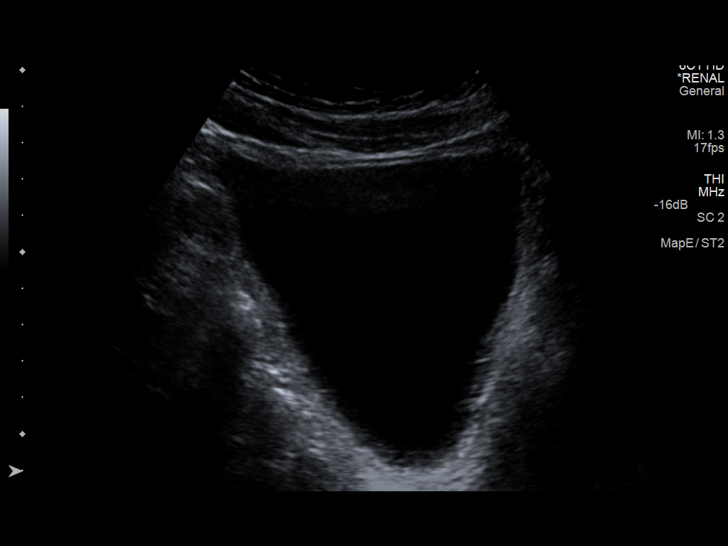
[im 16/29]
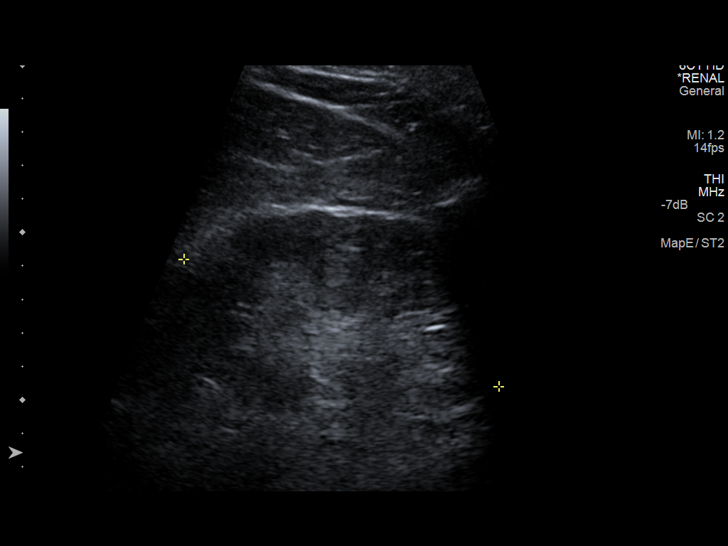
[im 18/29]
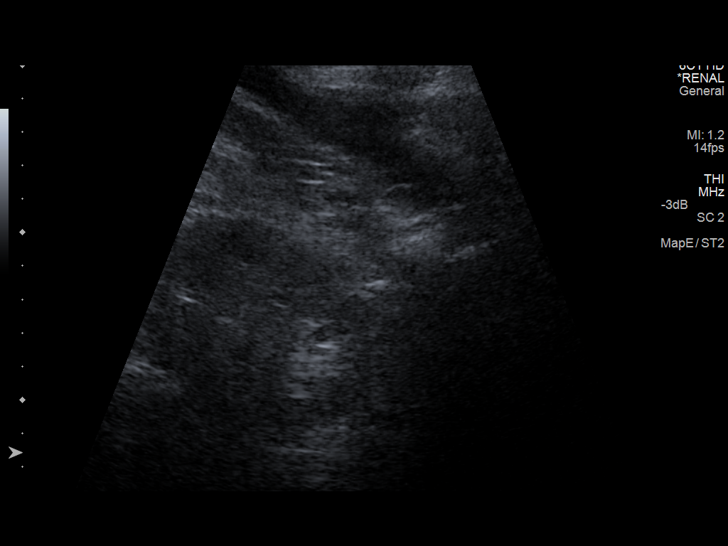
[im 19/29]
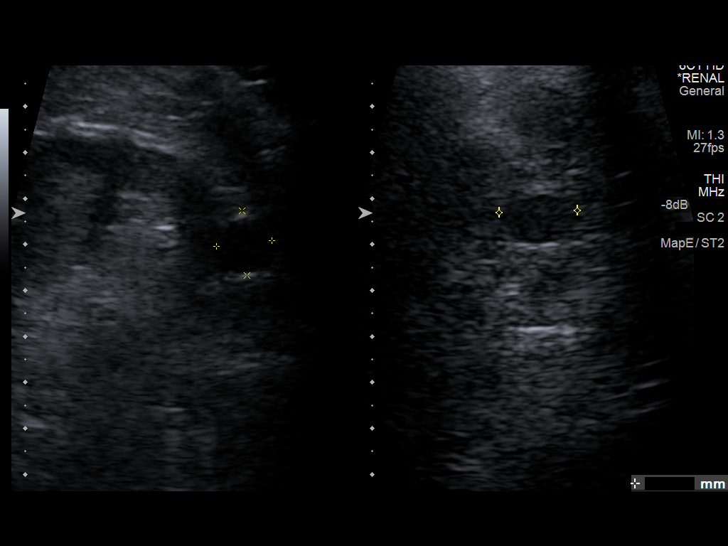
[im 22/29]
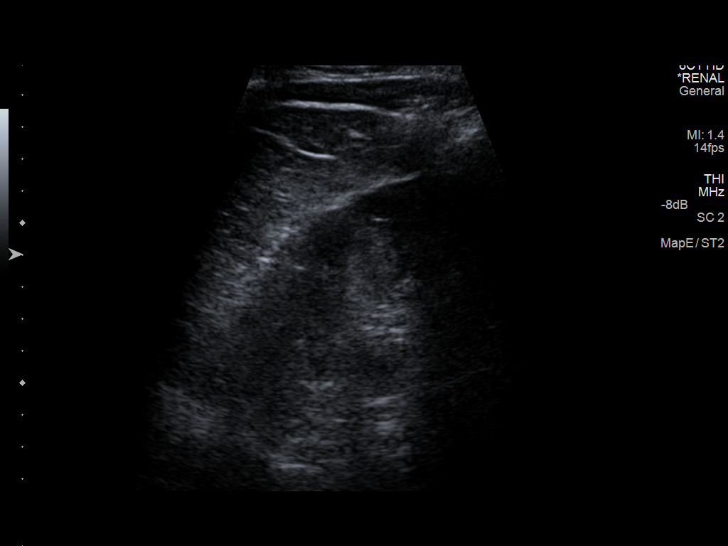
[im 24/29]
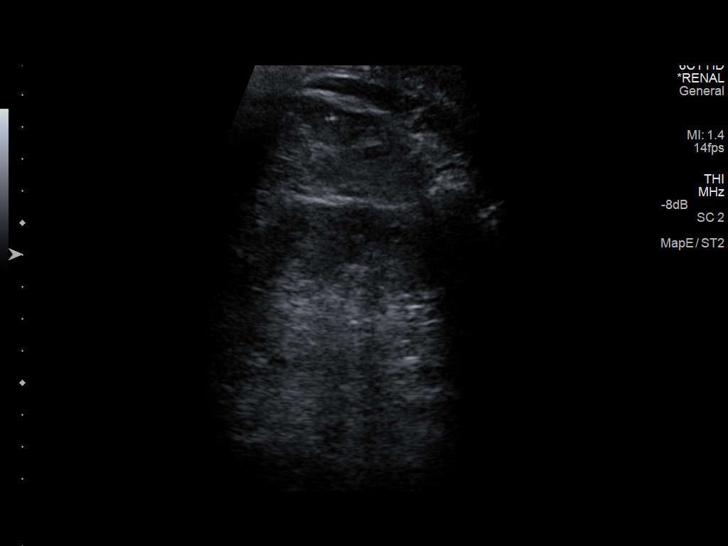
[im 26/29]
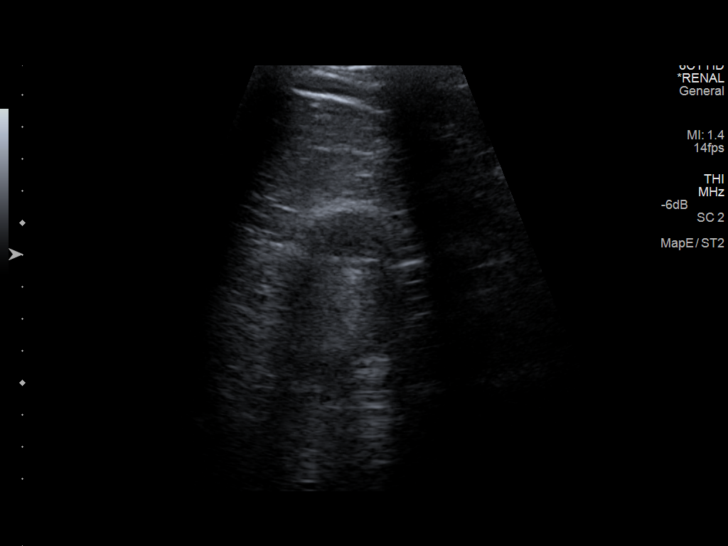
[im 29/29]
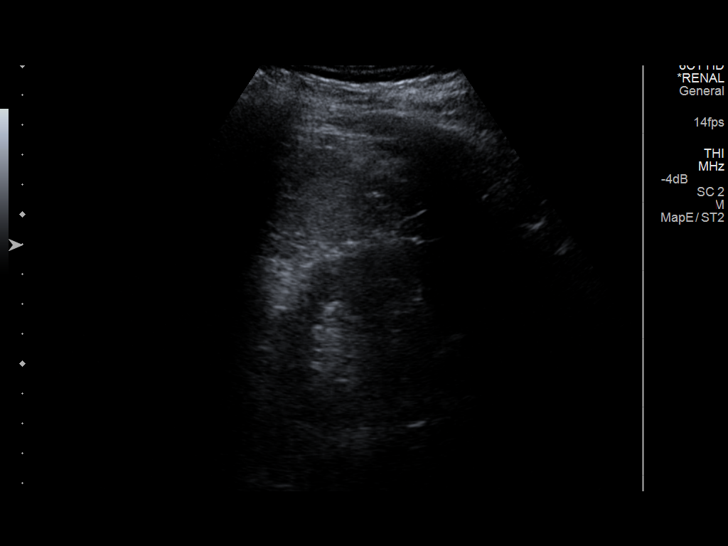

[14 of 25 positions shown; findings below may reference images not displayed]

FINDINGS: Right Kidney:

Length: 11 cm. Echogenicity within normal limits. No mass or
hydronephrosis visualized.

Left Kidney:

Length: 10 cm. Echogenicity within normal limits. No solid mass or
hydronephrosis visualized. Exophytic 14 mm cyst arises from the
lower pole, simple appearing.

Bladder:

Appears normal for degree of bladder distention.
IMPRESSION: No hydronephrosis or renal atrophy.

## 2016-05-22 ENCOUNTER — Encounter: Payer: Self-pay | Admitting: Vascular Surgery

## 2016-05-23 ENCOUNTER — Encounter: Payer: Self-pay | Admitting: Podiatry

## 2016-05-23 ENCOUNTER — Ambulatory Visit (INDEPENDENT_AMBULATORY_CARE_PROVIDER_SITE_OTHER): Payer: Medicare Other | Admitting: Podiatry

## 2016-05-23 VITALS — Ht 73.0 in | Wt 189.0 lb

## 2016-05-23 DIAGNOSIS — M79676 Pain in unspecified toe(s): Secondary | ICD-10-CM

## 2016-05-23 DIAGNOSIS — B351 Tinea unguium: Secondary | ICD-10-CM | POA: Diagnosis not present

## 2016-05-23 DIAGNOSIS — Q828 Other specified congenital malformations of skin: Secondary | ICD-10-CM | POA: Diagnosis not present

## 2016-05-23 NOTE — Progress Notes (Signed)
Patient ID: Wesley [name], male   DOB: 1946-07-10, 70 y.o.   MRN: [id number] Complaint:  Visit Type: Patient returns to my office for continued preventative foot care services. Complaint: Patient states" my nails have grown long and thick and become painful to walk and wear shoes" . He presents for preventative foot care services. No changes to ROS  Podiatric Exam: Vascular: dorsalis pedis and posterior tibial pulses are palpable bilateral. Capillary return is immediate. Temperature gradient is WNL. Skin turgor WNL  Sensorium: Normal Semmes Weinstein monofilament test. Normal tactile sensation bilaterally. Nail Exam: Pt has thick disfigured discolored nails with subungual debris noted bilateral entire nail hallux through fifth toenails Ulcer Exam: There is no evidence of ulcer or pre-ulcerative changes or infection. Orthopedic Exam: Muscle tone and strength are WNL. No limitations in general ROM. No crepitus or effusions noted. Foot type and digits show no abnormalities. Bony prominences are unremarkable. Skin:  Porokeratosis sub 5th metatarsal right foot.. No infection or ulcers.  .  Diagnosis:  Tinea unguium, Pain in right toe, pain in left toes.  .  Treatment & Plan Procedures and Treatment: Consent by patient was obtained for treatment procedures. The patient understood the discussion of treatment and procedures well. All questions were answered thoroughly reviewed. Debridement of mycotic and hypertrophic toenails, 1 through 5 bilateral and clearing of subungual debris. No ulceration, no infection noted.  Return Visit-Office Procedure: Patient instructed to return to the office for a follow up visit 3 months for continued evaluation and treatment.  Gardiner Barefoot DPM

## 2016-05-24 ENCOUNTER — Ambulatory Visit (HOSPITAL_COMMUNITY)
Admission: RE | Admit: 2016-05-24 | Discharge: 2016-05-24 | Disposition: A | Discharge status: Home / Self Care | Payer: Medicare Other | Source: Ambulatory Visit | Attending: Vascular Surgery | Admitting: Vascular Surgery

## 2016-05-24 ENCOUNTER — Ambulatory Visit (INDEPENDENT_AMBULATORY_CARE_PROVIDER_SITE_OTHER)
Admission: RE | Admit: 2016-05-24 | Discharge: 2016-05-24 | Disposition: A | Discharge status: Home / Self Care | Payer: Medicare Other | Source: Ambulatory Visit | Attending: Vascular Surgery | Admitting: Vascular Surgery

## 2016-05-24 ENCOUNTER — Ambulatory Visit (INDEPENDENT_AMBULATORY_CARE_PROVIDER_SITE_OTHER): Payer: Medicare Other | Admitting: Vascular Surgery

## 2016-05-24 ENCOUNTER — Encounter: Payer: Self-pay | Admitting: Vascular Surgery

## 2016-05-24 VITALS — BP 139/87 | HR 89 | Temp 97.4°F | Resp 16 | Ht 73.0 in | Wt 187.9 lb

## 2016-05-24 DIAGNOSIS — I714 Abdominal aortic aneurysm, without rupture, unspecified: Secondary | ICD-10-CM

## 2016-05-24 DIAGNOSIS — I6521 Occlusion and stenosis of right carotid artery: Secondary | ICD-10-CM | POA: Diagnosis not present

## 2016-05-24 DIAGNOSIS — I6523 Occlusion and stenosis of bilateral carotid arteries: Secondary | ICD-10-CM

## 2016-05-24 NOTE — Progress Notes (Signed)
HISTORY AND PHYSICAL        CC:  Follow up carotid/AAA   Referring Provider:  Philis Fendt, MD   HPI: Patient is a 70 year old male who returns for follow-up today. He previously underwent left carotid endarterectomy August 2015. He also underwent endovascular aneurysm repair of an aortic aneurysm July 2015. His aneurysm had shrunk from 5.7 cm to 3.5 cm.  He denies any abdominal or back pain. He denies any episodes of TIA amaurosis or stroke. He is taking aspirin daily. He is also on savaysa.   Review of systems: He denies shortness of breath or chest pain.             Past Medical History   Diagnosis  Date   .  AAA (abdominal aortic aneurysm)     .  Carotid artery occlusion     .  Coronary artery disease     .  Arthritis         "legs" (01/19/2014)   .  Chronic back pain         "neck to tailbone" (01/19/2014)   .  Hyperlipidemia         takes Zocor daily   .  History of bronchitis  25yrs ago   .  Seizures         has had one several yrs ago but never been on meds   .  Stroke  04/16/1986       takes Plavix daily as well as Savaysa;right sided weakness   .  Back pain         reason unknown   .  Dysphagia     .  History of colon polyps     .  History of blood transfusion     .  Schizophrenia         takes Haldol daily   .  Dysrhythmia         Atrial Fib-takes Atenolol daily   .  Hypertension         takes Clonidine,Apresoline,Imdur,and Lisinopril daily             Past Surgical History   Procedure  Laterality  Date   .  Carpal tunnel release  Right     .  Eye surgery  Bilateral         cataract surgery   .  Colonoscopy       .  Abdominal aortic endovascular stent graft  N/A  02/01/2014       Procedure: ABDOMINAL AORTIC ENDOVASCULAR STENT GRAFT;  Surgeon: Elam Dutch, MD;  Location: [facility] OR;  Service: Vascular;  Laterality: N/A;   .  Coronary angioplasty with stent placement    01/19/2014       "1"   .  Endarterectomy  Left  03/16/2014       Procedure: LEFT  CAROTID ENDARTERECTOMY WITH PATCH ANGIOPLASTY ;  Surgeon: Elam Dutch, MD;  Location: [place];  Service: Vascular;  Laterality: Left;   .  Left heart catheterization with coronary angiogram  N/A  01/19/2014       Procedure: LEFT HEART CATHETERIZATION WITH CORONARY ANGIOGRAM;  Surgeon: Laverda Page, MD;  Location: [facility] CATH LAB;  Service: Cardiovascular;  Laterality: N/A;   .  Percutaneous coronary stent intervention (pci-s)    01/19/2014       Procedure: PERCUTANEOUS CORONARY STENT INTERVENTION (PCI-S);  Surgeon: Laverda Page, MD;  Location: [facility] CATH LAB;  Service: Cardiovascular;;  No Known Allergies   Current Outpatient Prescriptions on File Prior to Visit  Medication Sig Dispense Refill  . atenolol (TENORMIN) 100 MG tablet Take 1 tablet (100 mg total) by mouth 2 (two) times daily. 180 tablet 1  . clopidogrel (PLAVIX) 75 MG tablet Take 1 tablet (75 mg total) by mouth daily with breakfast. 30 tablet 6  . Edoxaban Tosylate (SAVAYSA) 60 MG TABS Take 60 mg by mouth daily.    . haloperidol (HALDOL) 5 MG tablet Take 2.5 mg by mouth at bedtime.     . hydrALAZINE (APRESOLINE) 50 MG tablet Take 50 mg by mouth 3 (three) times daily.     . isosorbide mononitrate (IMDUR) 60 MG 24 hr tablet Take 60 mg by mouth at bedtime.     Marland Kitchen lisinopril-hydrochlorothiazide (PRINZIDE,ZESTORETIC) 20-25 MG per tablet Take 1 tablet by mouth daily. Take every morning    . oxyCODONE (OXY IR/ROXICODONE) 5 MG immediate release tablet Take 1 tablet (5 mg total) by mouth every 6 (six) hours as needed for severe pain. 20 tablet 0  . oxyCODONE-acetaminophen (PERCOCET) 5-325 MG per tablet Take 2 tablets by mouth every 4 (four) hours as needed. 6 tablet 0  . simvastatin (ZOCOR) 20 MG tablet Take 20 mg by mouth at bedtime.   3  . trihexyphenidyl (ARTANE) 2 MG tablet Take 2 mg by mouth at bedtime.      No current facility-administered medications on file prior to visit.    Pt's meds include: Statin:  Yes.   Beta  Blocker:  Yes.   Aspirin: No. Other antiplatelets/anticoagulants:  Yes.   Plavix          Family History   Problem  Relation  Age of Onset   .  Cancer  Father     .  Hyperlipidemia  Father     .  Hypertension  Father     .  Heart disease  Sister     .  AAA (abdominal aortic aneurysm)  Sister         History          Social History   .  Marital Status:  Widowed       Spouse Name:  N/A   .  Number of Children:  N/A   .  Years of Education:  N/A        Occupational History   .  Not on file.           Social History Main Topics   .  Smoking status:  Former Smoker -- 1.00 packs/day for 58 years       Types:  Cigarettes   .  Smokeless tobacco:  Never Used         Comment: quit smoking a couple of yrs ago   .  Alcohol Use:  No   .  Drug Use:  No   .  Sexual Activity:  Not on file         Other Topics  Concern   .  Not on file     Social History Narrative         ROS: [x]  Positive   [ ]  Negative   [ ]  All sytems reviewed and are negative   Cardiovascular: []  chest pain/pressure []  palpitations []  SOB lying flat []  DOE []  pain in legs while walking []  pain in feet when lying flat []  hx of DVT []  hx of phlebitis []  swelling in legs []  varicose veins  Pulmonary: []  productive cough []  asthma []  wheezing   Neurologic: []  weakness in []  arms []  legs []  numbness in []  arms []  legs [] difficulty speaking or slurred speech []  temporary loss of vision in one eye []  dizziness   Hematologic: [x]  bleeding problems-recent nosebleed (visit to ER) []  problems with blood clotting easily   GI []  vomiting blood []  blood in stool   GU: []  burning with urination []  blood in urine   Psychiatric: []  hx of major depression   Integumentary: []  rashes []  ulcers   Constitutional: []  fever []  chills     PHYSICAL EXAMINATION:    General:  WDWN in NAD Neck: No carotid bruits well-healed left neck incision  Pulmonary: Clear to auscultation bilaterally    Cardiac: Regular rate and rhythm  Abdomen: soft, NT, no masses Skin: without rashes,  without ulcers  Vascular Exam/Pulses: 2+ femoral pulses 2+ left posterior tibial pulse absent right pedal pulses  Extremities: without ischemic changes, without Gangrene , without cellulitis; without open wounds;      Neurologic: A&O X 3, symmetric upper and lower extremity motor strength which is 5 over 5   Non-Invasive Vascular Imaging: Carotid Duplex Scan:  40-60% right internal carotid artery stenosis no significant left internal carotid artery stenosis. Ultrasound of abdominal aorta shows aneurysm diameter of 3.7 cm.   ASSESSMENT: 70 y.o. male s/p left CEA 03/16/14 and EVAR 02/01/14 overall doing well     PLAN:   He will follow-up in one year with repeat carotid duplex exam and an ultrasound of his aortic stent graft.      Ruta Hinds, MD [facility] [place] Office: [phone] Pager: [phone]

## 2016-06-12 NOTE — Addendum Note (Signed)
Addended by: Thresa Ross C on: 06/12/2016 02:08 PM   Modules accepted: Orders

## 2016-06-18 DIAGNOSIS — F205 Residual schizophrenia: Secondary | ICD-10-CM | POA: Diagnosis not present

## 2016-07-01 IMAGING — CR DG CHEST 2V
2 series · 2 of 2 positions shown · non-contrast
Comparison: 02/01/2014 and 01/19/2014

CLINICAL DATA: Dizziness and fall.

EXAM:
CHEST  2 VIEW

[chest pa]
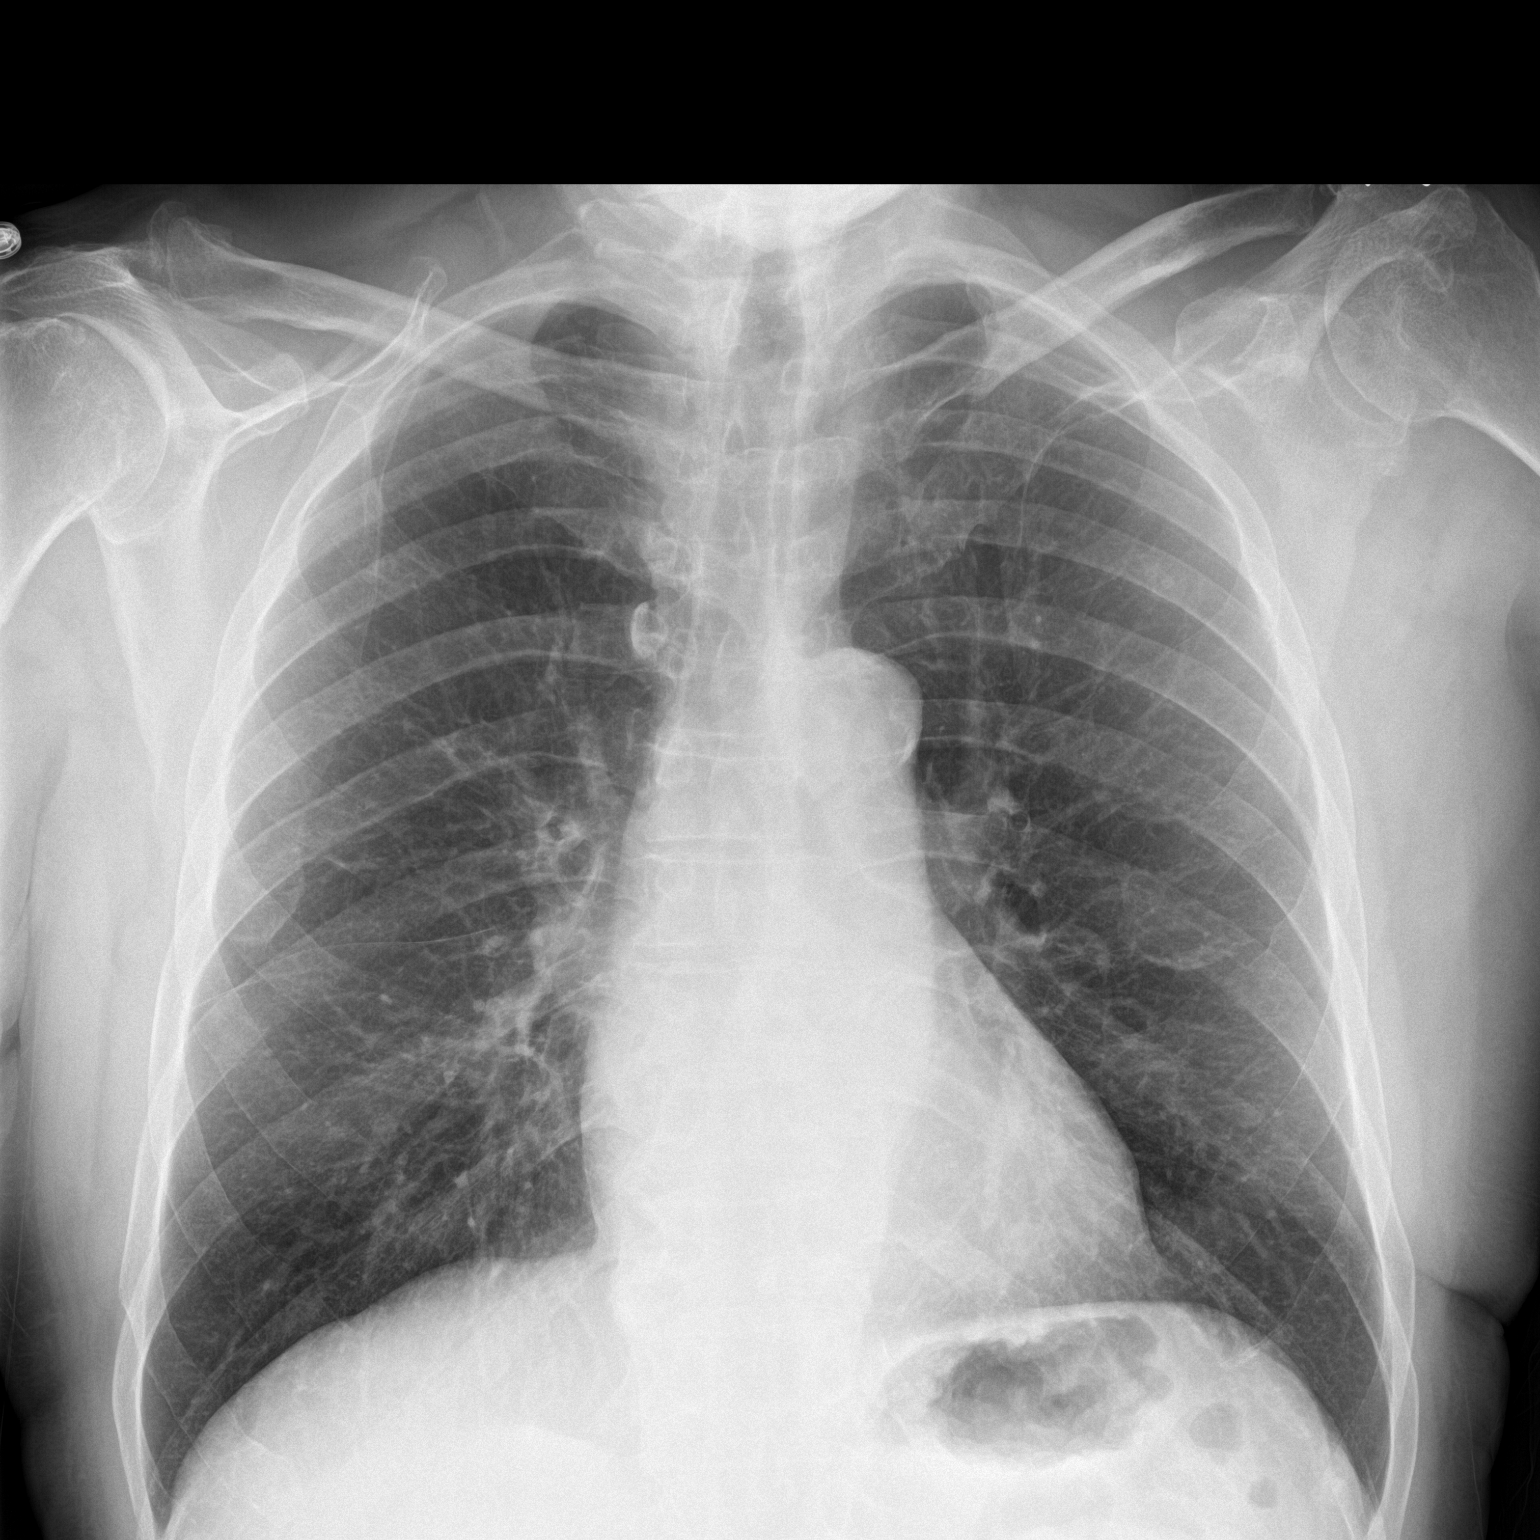

[chest lat]
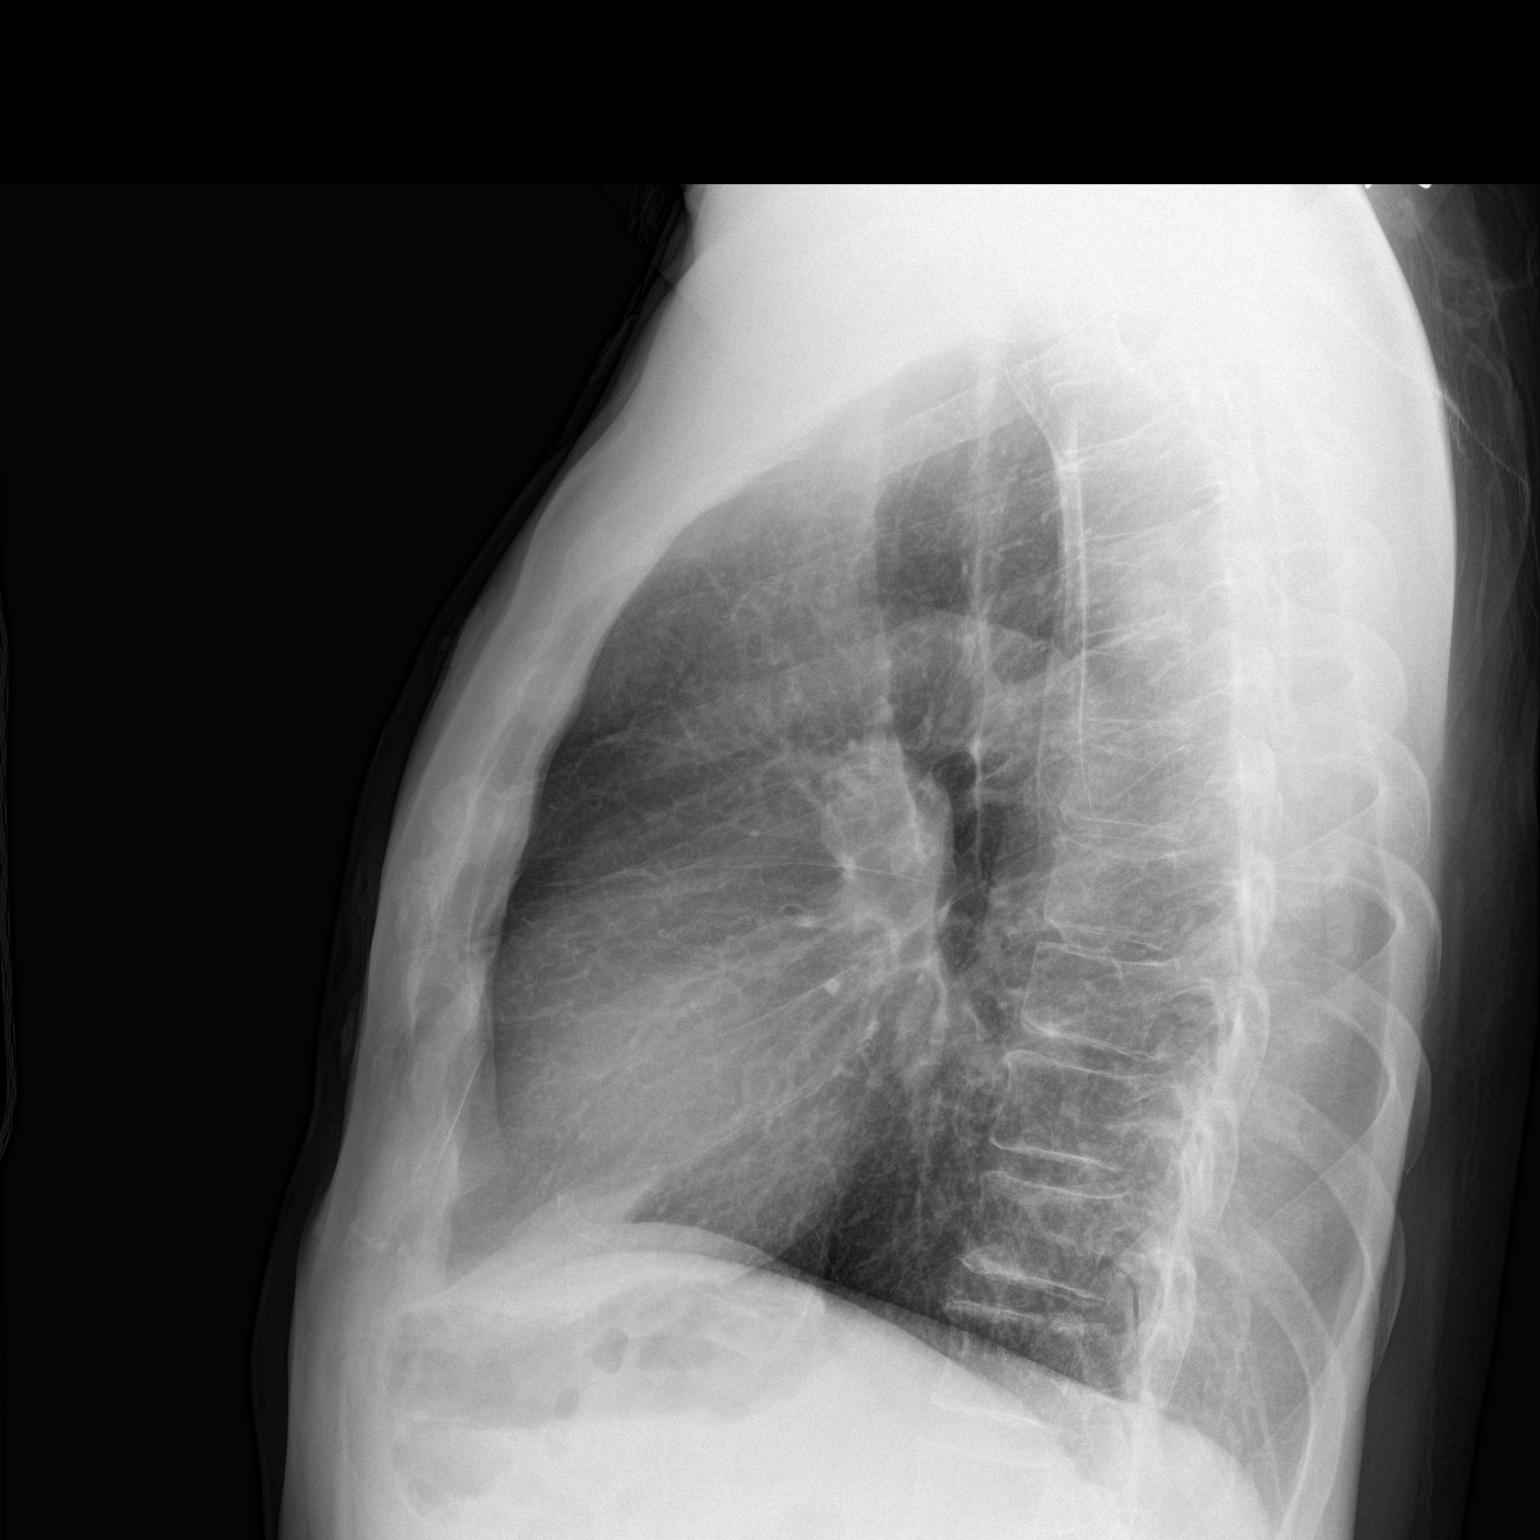

[2 of 2 positions shown; findings below may reference images not displayed]

FINDINGS: The cardiomediastinal silhouette is unremarkable.

There is no evidence of focal airspace disease, pulmonary edema,
suspicious pulmonary nodule/mass, pleural effusion, or pneumothorax.
No acute bony abnormalities are identified.
IMPRESSION: No active cardiopulmonary disease.

## 2016-07-01 IMAGING — CT CT HEAD W/O CM
1 series · 15 of 30 positions shown, 19 images · non-contrast
Comparison: None.

CLINICAL DATA: Dizziness, multiple falls this week.

EXAM:
CT HEAD WITHOUT CONTRAST
CT CERVICAL SPINE WITHOUT CONTRAST
TECHNIQUE: Multidetector CT imaging of the head and cervical spine was
performed following the standard protocol without intravenous
contrast. Multiplanar CT image reconstructions of the cervical spine
were also generated.

[Series 2: head 5.0 h30s · axial · 0.47mm/px · z∈[-152,-7]mm · 15 of 33 slices shown, 19 images]
[im 2/33  brain]
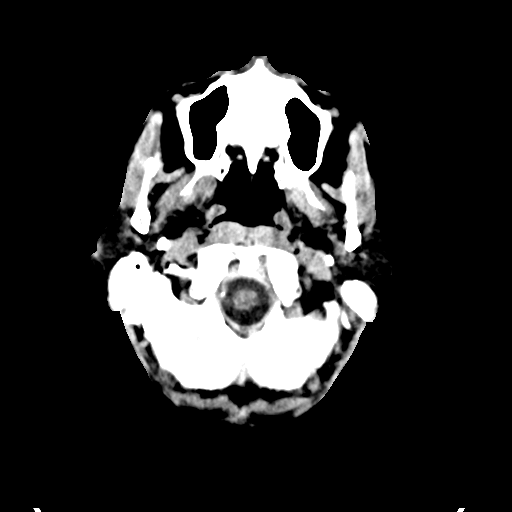
[im 2/33  bone]
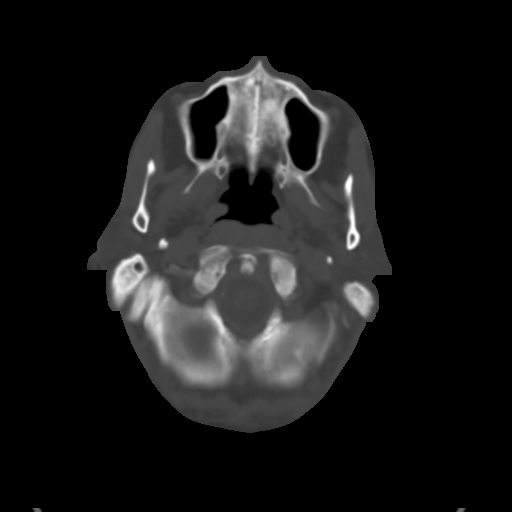
[im 4/33  brain]
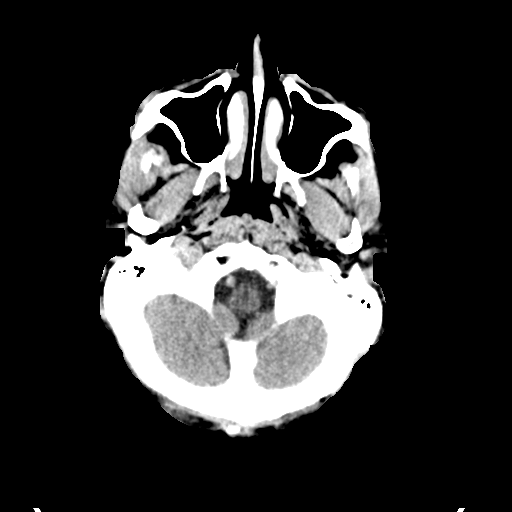
[im 6/33  brain]
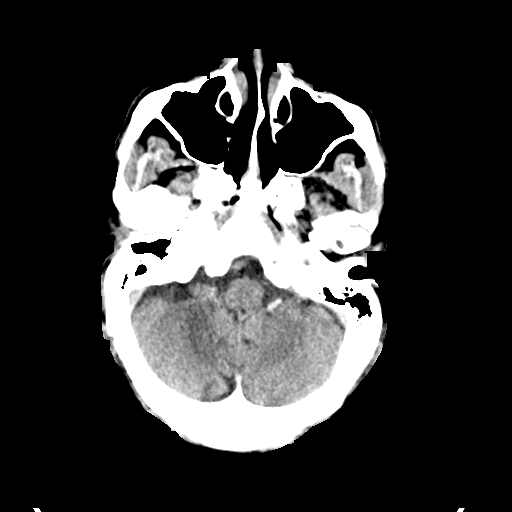
[im 8/33  brain]
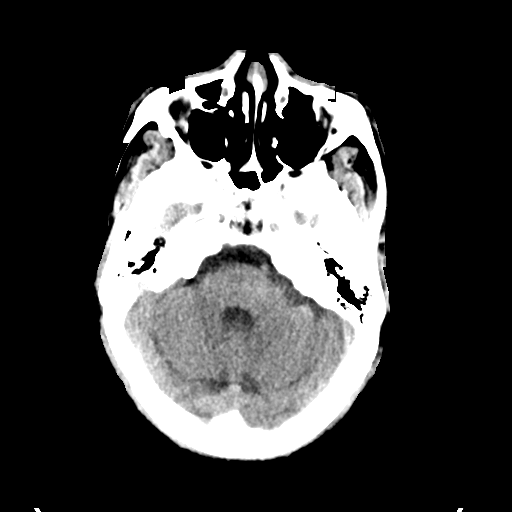
[im 10/33  brain]
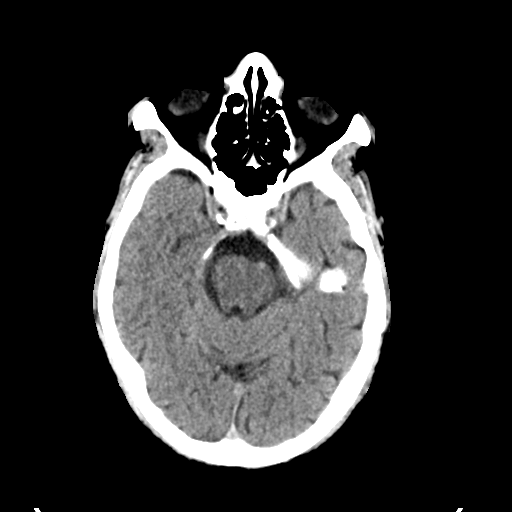
[im 10/33  bone]
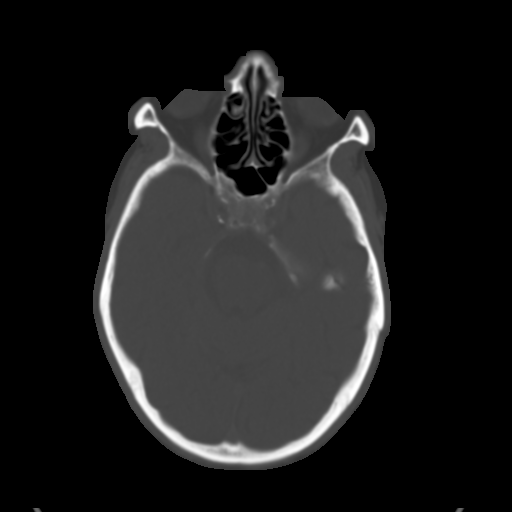
[im 13/33  brain]
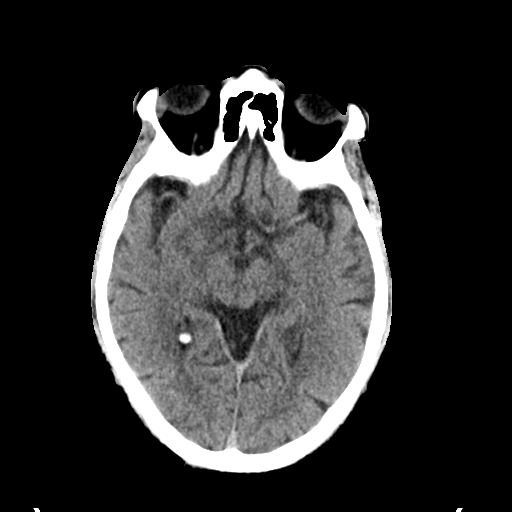
[im 15/33  brain]
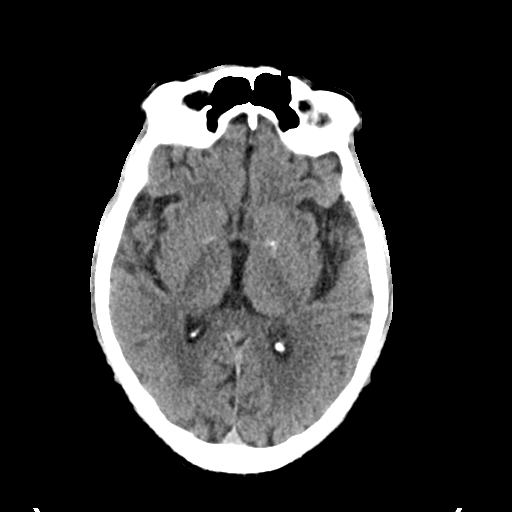
[im 17/33  brain]
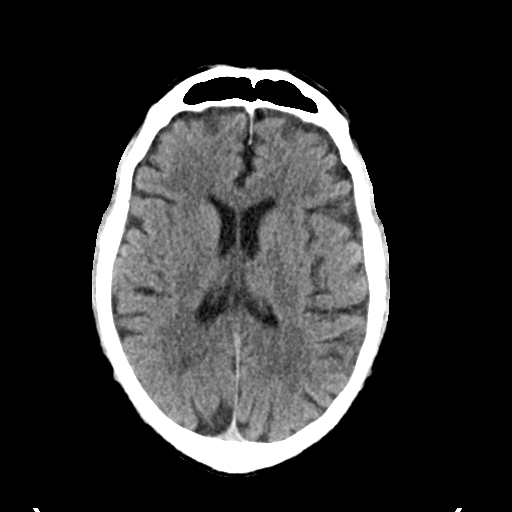
[im 18/33  brain]
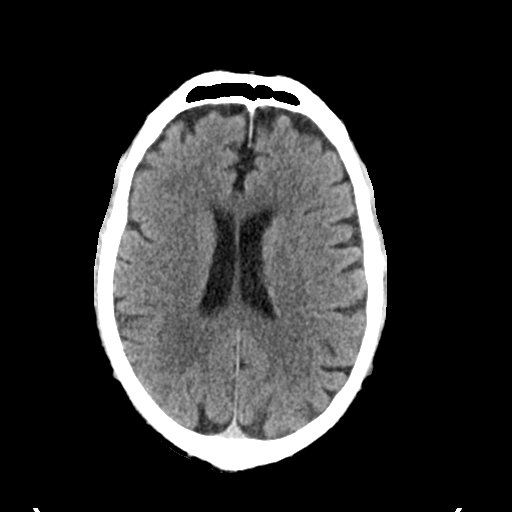
[im 18/33  bone]
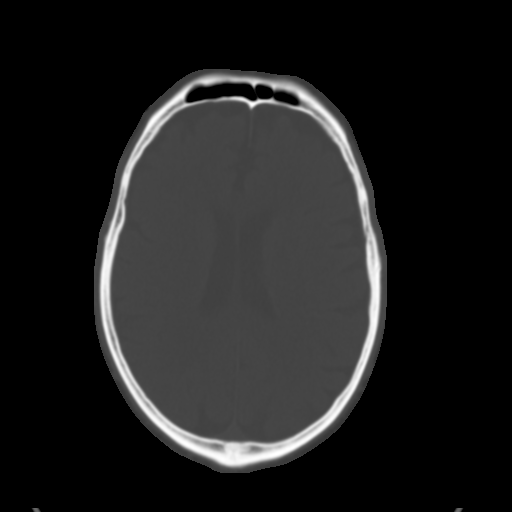
[im 20/33  brain]
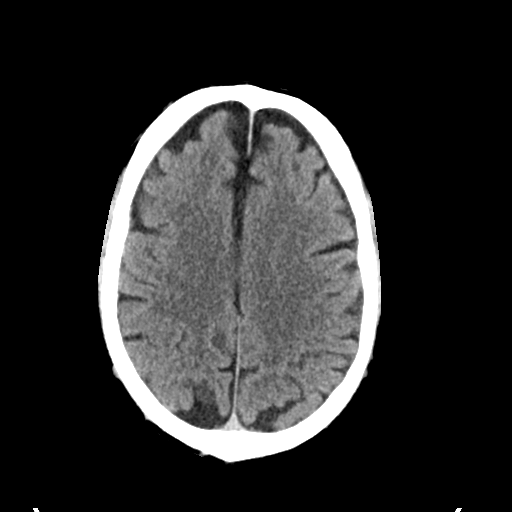
[im 23/33  brain]
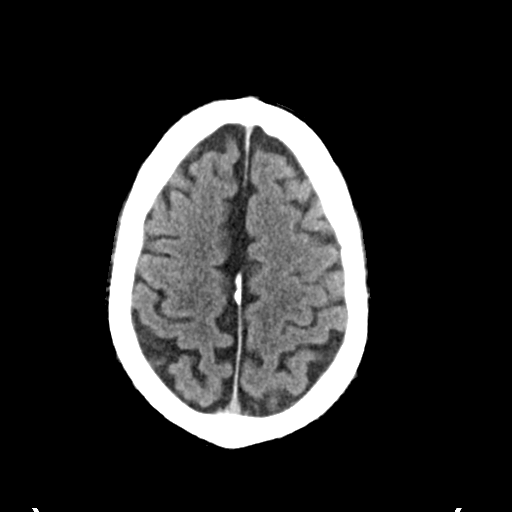
[im 25/33  brain]
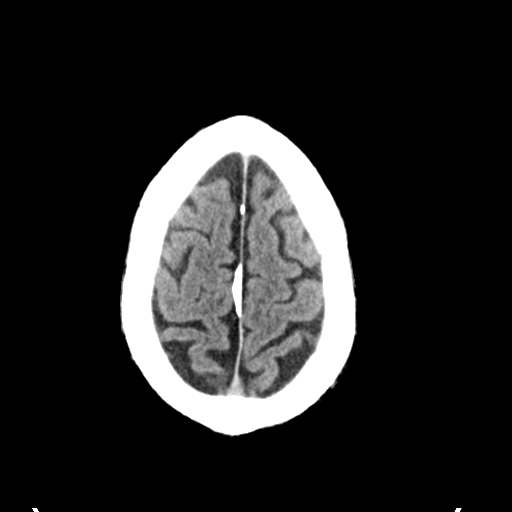
[im 27/33  brain]
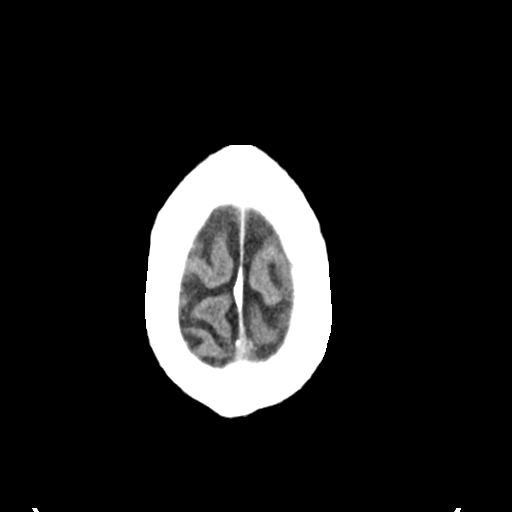
[im 27/33  bone]
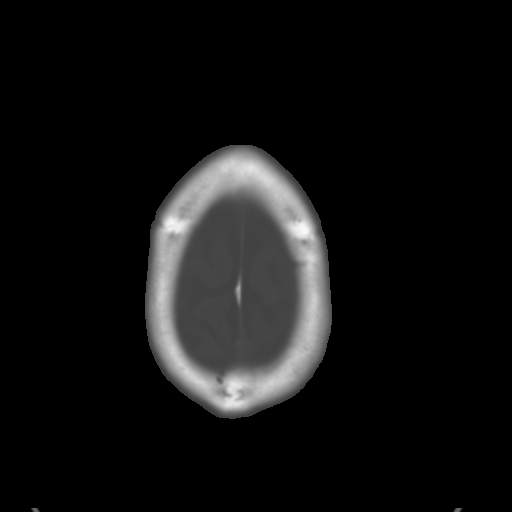
[im 29/33  brain]
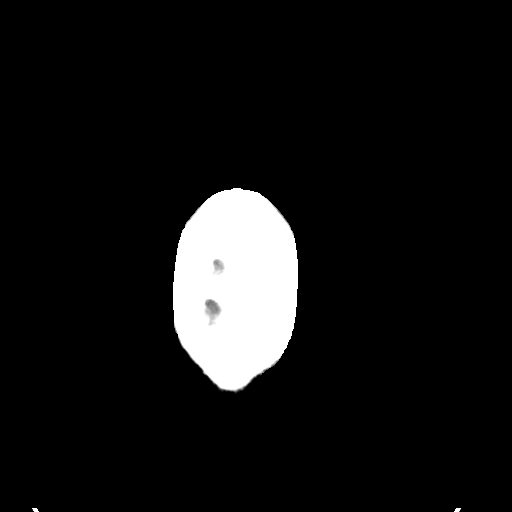
[im 31/33  brain]
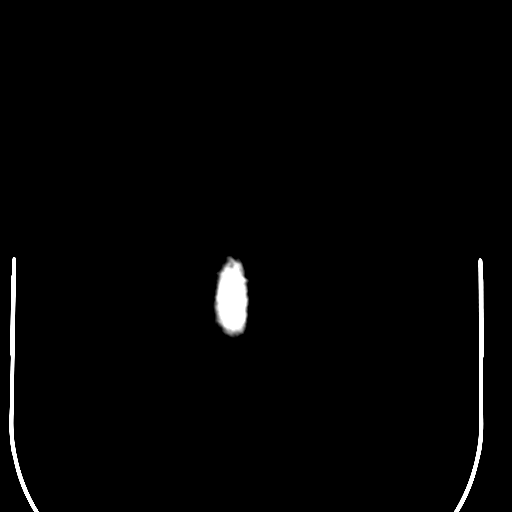

[15 of 30 positions shown; findings below may reference images not displayed]

FINDINGS: CT HEAD FINDINGS

Bony calvarium appears intact. Mild diffuse cortical atrophy is
noted. Mild chronic ischemic white matter disease is noted. No mass
effect or midline shift is noted. Ventricular size is within normal
limits. There is no evidence of mass lesion, hemorrhage or acute
infarction.

CT CERVICAL SPINE FINDINGS

No fracture or spondylolisthesis is noted. Anterior osteophyte
formation is noted at all levels of the cervical spine. Degenerative
changes seen involving the left-sided posterior facet joint at C5-6.
Visualized lung apices appear normal.
IMPRESSION: Mild diffuse cortical atrophy. Mild chronic ischemic white matter
disease. No acute intracranial abnormality seen.

Mild degenerative changes are noted in the cervical spine. No
fracture or spondylolisthesis is noted.

## 2016-08-15 ENCOUNTER — Ambulatory Visit: Payer: Medicare Other | Admitting: Podiatry

## 2016-08-16 ENCOUNTER — Ambulatory Visit: Payer: Medicare Other | Admitting: Podiatry

## 2016-08-29 ENCOUNTER — Ambulatory Visit (INDEPENDENT_AMBULATORY_CARE_PROVIDER_SITE_OTHER): Payer: Medicare Other | Admitting: Podiatry

## 2016-08-29 ENCOUNTER — Encounter: Payer: Self-pay | Admitting: Podiatry

## 2016-08-29 VITALS — Ht 73.0 in | Wt 187.0 lb

## 2016-08-29 DIAGNOSIS — B351 Tinea unguium: Secondary | ICD-10-CM | POA: Diagnosis not present

## 2016-08-29 DIAGNOSIS — M79676 Pain in unspecified toe(s): Secondary | ICD-10-CM | POA: Diagnosis not present

## 2016-08-29 NOTE — Progress Notes (Addendum)
Patient ID: Wesley [name], male   DOB: [birth year], 71 y.o.   MRN: [id number] Complaint:  Visit Type: Patient returns to my office for continued preventative foot care services. Complaint: Patient states" my nails have grown long and thick and become painful to walk and wear shoes" . He presents for preventative foot care services. No changes to ROS  Podiatric Exam: Vascular: dorsalis pedis and posterior tibial pulses are palpable bilateral. Capillary return is immediate. Temperature gradient is WNL. Skin turgor WNL  Sensorium: Normal Semmes Weinstein monofilament test. Normal tactile sensation bilaterally. Nail Exam: Pt has thick disfigured discolored nails with subungual debris noted bilateral entire nail hallux through fifth toenails Ulcer Exam: There is no evidence of ulcer or pre-ulcerative changes or infection. Orthopedic Exam: Muscle tone and strength are WNL. No limitations in general ROM. No crepitus or effusions noted. Foot type and digits show no abnormalities. Bony prominences are unremarkable. Skin:  Asymptomatic  Porokeratosis sub 5th metatarsal right foot.. No infection or ulcers.  .  Diagnosis:  Tinea unguium, Pain in right toe, pain in left toes.  .  Treatment & Plan Procedures and Treatment: Consent by patient was obtained for treatment procedures. The patient understood the discussion of treatment and procedures well. All questions were answered thoroughly reviewed. Debridement of mycotic and hypertrophic toenails, 1 through 5 bilateral and clearing of subungual debris. No ulceration, no infection noted.  Return Visit-Office Procedure: Patient instructed to return to the office for a follow up visit 3 months for continued evaluation and treatment.  Gardiner Barefoot DPM

## 2016-10-11 DIAGNOSIS — I25119 Atherosclerotic heart disease of native coronary artery with unspecified angina pectoris: Secondary | ICD-10-CM | POA: Diagnosis not present

## 2016-10-11 DIAGNOSIS — I482 Chronic atrial fibrillation: Secondary | ICD-10-CM | POA: Diagnosis not present

## 2016-10-11 DIAGNOSIS — I10 Essential (primary) hypertension: Secondary | ICD-10-CM | POA: Diagnosis not present

## 2016-10-11 DIAGNOSIS — E7800 Pure hypercholesterolemia, unspecified: Secondary | ICD-10-CM | POA: Diagnosis not present

## 2016-11-07 DIAGNOSIS — F205 Residual schizophrenia: Secondary | ICD-10-CM | POA: Diagnosis not present

## 2016-11-08 DIAGNOSIS — I25119 Atherosclerotic heart disease of native coronary artery with unspecified angina pectoris: Secondary | ICD-10-CM | POA: Diagnosis not present

## 2016-11-08 DIAGNOSIS — I10 Essential (primary) hypertension: Secondary | ICD-10-CM | POA: Diagnosis not present

## 2016-11-08 DIAGNOSIS — I482 Chronic atrial fibrillation: Secondary | ICD-10-CM | POA: Diagnosis not present

## 2016-11-08 DIAGNOSIS — Z95828 Presence of other vascular implants and grafts: Secondary | ICD-10-CM | POA: Diagnosis not present

## 2016-11-14 ENCOUNTER — Ambulatory Visit: Payer: Medicare Other | Admitting: Podiatry

## 2016-12-06 ENCOUNTER — Ambulatory Visit (HOSPITAL_COMMUNITY): Payer: Medicare Other

## 2016-12-06 ENCOUNTER — Ambulatory Visit: Payer: Medicare Other | Admitting: Family

## 2016-12-26 ENCOUNTER — Ambulatory Visit (INDEPENDENT_AMBULATORY_CARE_PROVIDER_SITE_OTHER): Payer: Medicare Other | Admitting: Podiatry

## 2016-12-26 ENCOUNTER — Encounter: Payer: Self-pay | Admitting: Podiatry

## 2016-12-26 DIAGNOSIS — B351 Tinea unguium: Secondary | ICD-10-CM

## 2016-12-26 DIAGNOSIS — M79676 Pain in unspecified toe(s): Secondary | ICD-10-CM

## 2016-12-26 NOTE — Progress Notes (Signed)
Patient ID: Wesley [name], male   DOB: [birth year], 71 y.o.   MRN: [id number] Complaint:  Visit Type: Patient returns to my office for continued preventative foot care services. Complaint: Patient states" my nails have grown long and thick and become painful to walk and wear shoes" . He presents for preventative foot care services. No changes to ROS  Podiatric Exam: Vascular: dorsalis pedis and posterior tibial pulses are palpable bilateral. Capillary return is immediate. Temperature gradient is WNL. Skin turgor WNL  Sensorium: Normal Semmes Weinstein monofilament test. Normal tactile sensation bilaterally. Nail Exam: Pt has thick disfigured discolored nails with subungual debris noted bilateral entire nail hallux through fifth toenails Ulcer Exam: There is no evidence of ulcer or pre-ulcerative changes or infection. Orthopedic Exam: Muscle tone and strength are WNL. No limitations in general ROM. No crepitus or effusions noted. Foot type and digits show no abnormalities. Bony prominences are unremarkable. Skin:  Asymptomatic  Porokeratosis sub 5th metatarsal right foot..Asymptomatic pinch callus hallux  B/L No infection or ulcers.  .  Diagnosis:  Tinea unguium, Pain in right toe, pain in left toes.  .  Treatment & Plan Procedures and Treatment: Consent by patient was obtained for treatment procedures. The patient understood the discussion of treatment and procedures well. All questions were answered thoroughly reviewed. Debridement of mycotic and hypertrophic toenails, 1 through 5 bilateral and clearing of subungual debris. No ulceration, no infection noted.  Return Visit-Office Procedure: Patient instructed to return to the office for a follow up visit 3 months for continued evaluation and treatment.  Gardiner Barefoot DPM

## 2017-01-07 DIAGNOSIS — F205 Residual schizophrenia: Secondary | ICD-10-CM | POA: Diagnosis not present

## 2017-01-10 DIAGNOSIS — H903 Sensorineural hearing loss, bilateral: Secondary | ICD-10-CM | POA: Diagnosis not present

## 2017-02-11 DIAGNOSIS — H903 Sensorineural hearing loss, bilateral: Secondary | ICD-10-CM | POA: Diagnosis not present

## 2017-03-29 DIAGNOSIS — H903 Sensorineural hearing loss, bilateral: Secondary | ICD-10-CM | POA: Diagnosis not present

## 2017-04-03 ENCOUNTER — Ambulatory Visit: Payer: Medicare Other | Admitting: Podiatry

## 2017-04-16 ENCOUNTER — Encounter: Payer: Self-pay | Admitting: Podiatry

## 2017-04-16 ENCOUNTER — Ambulatory Visit (INDEPENDENT_AMBULATORY_CARE_PROVIDER_SITE_OTHER): Payer: Medicare Other | Admitting: Podiatry

## 2017-04-16 DIAGNOSIS — Q828 Other specified congenital malformations of skin: Secondary | ICD-10-CM

## 2017-04-16 DIAGNOSIS — B351 Tinea unguium: Secondary | ICD-10-CM

## 2017-04-16 DIAGNOSIS — M79676 Pain in unspecified toe(s): Secondary | ICD-10-CM

## 2017-04-16 NOTE — Progress Notes (Signed)
Patient ID: Wesley [name], male   DOB: 09/09/1945, 71 y.o.   MRN: [id number] Complaint:  Visit Type: Patient returns to my office for continued preventative foot care services. Complaint: Patient states" my nails have grown long and thick and become painful to walk and wear shoes" . He presents for preventative foot care services. No changes to ROS  Podiatric Exam: Vascular: dorsalis pedis and posterior tibial pulses are palpable bilateral. Capillary return is immediate. Temperature gradient is WNL. Skin turgor WNL  Sensorium: Normal Semmes Weinstein monofilament test. Normal tactile sensation bilaterally. Nail Exam: Pt has thick disfigured discolored nails with subungual debris noted bilateral entire nail hallux through fifth toenails Ulcer Exam: There is no evidence of ulcer or pre-ulcerative changes or infection. Orthopedic Exam: Muscle tone and strength are WNL. No limitations in general ROM. No crepitus or effusions noted. Foot type and digits show no abnormalities. Bony prominences are unremarkable. Skin:  Asymptomatic  Porokeratosis sub 5th metatarsal right foot..Symptomatic pinch callus hallux  B/L No infection or ulcers.  .  Diagnosis:  Tinea unguium, Pain in right toe, pain in left toes. Callus  B/L .  Treatment & Plan Procedures and Treatment: Consent by patient was obtained for treatment procedures. The patient understood the discussion of treatment and procedures well. All questions were answered thoroughly reviewed. Debridement of mycotic and hypertrophic toenails, 1 through 5 bilateral and clearing of subungual debris. No ulceration, no infection noted. Debride callus. Return Visit-Office Procedure: Patient instructed to return to the office for a follow up visit 3 months for continued evaluation and treatment.  Gardiner Barefoot DPM

## 2017-05-17 DIAGNOSIS — H903 Sensorineural hearing loss, bilateral: Secondary | ICD-10-CM | POA: Diagnosis not present

## 2017-05-24 ENCOUNTER — Other Ambulatory Visit (HOSPITAL_COMMUNITY): Payer: Medicare Other

## 2017-05-24 ENCOUNTER — Encounter (HOSPITAL_COMMUNITY): Payer: Medicare Other

## 2017-05-24 ENCOUNTER — Ambulatory Visit: Payer: Medicare Other | Admitting: Family

## 2017-06-10 ENCOUNTER — Other Ambulatory Visit (HOSPITAL_COMMUNITY): Payer: Medicare Other

## 2017-06-10 ENCOUNTER — Ambulatory Visit: Payer: Medicare Other | Admitting: Family

## 2017-06-10 ENCOUNTER — Encounter (HOSPITAL_COMMUNITY): Payer: Medicare Other

## 2017-07-17 ENCOUNTER — Ambulatory Visit: Payer: Medicare Other | Admitting: Podiatry

## 2017-08-27 ENCOUNTER — Encounter: Payer: Self-pay | Admitting: Family

## 2017-08-27 ENCOUNTER — Ambulatory Visit (HOSPITAL_COMMUNITY)
Admission: RE | Admit: 2017-08-27 | Discharge: 2017-08-27 | Disposition: A | Discharge status: Home / Self Care | Payer: Medicare Other | Source: Ambulatory Visit | Attending: Family | Admitting: Family

## 2017-08-27 ENCOUNTER — Ambulatory Visit (INDEPENDENT_AMBULATORY_CARE_PROVIDER_SITE_OTHER): Payer: Medicare Other | Admitting: Family

## 2017-08-27 ENCOUNTER — Ambulatory Visit (INDEPENDENT_AMBULATORY_CARE_PROVIDER_SITE_OTHER)
Admission: RE | Admit: 2017-08-27 | Discharge: 2017-08-27 | Disposition: A | Discharge status: Home / Self Care | Payer: Medicare Other | Source: Ambulatory Visit | Attending: Family | Admitting: Family

## 2017-08-27 VITALS — BP 120/84 | HR 94 | Temp 98.7°F | Resp 16 | Ht 73.0 in | Wt 187.0 lb

## 2017-08-27 DIAGNOSIS — Z95828 Presence of other vascular implants and grafts: Secondary | ICD-10-CM | POA: Diagnosis not present

## 2017-08-27 DIAGNOSIS — Z9889 Other specified postprocedural states: Secondary | ICD-10-CM | POA: Diagnosis not present

## 2017-08-27 DIAGNOSIS — I6523 Occlusion and stenosis of bilateral carotid arteries: Secondary | ICD-10-CM | POA: Diagnosis not present

## 2017-08-27 DIAGNOSIS — I714 Abdominal aortic aneurysm, without rupture, unspecified: Secondary | ICD-10-CM

## 2017-08-27 LAB — VAS US CAROTID
LCCADDIAS: -33 cm/s
LCCADSYS: -94 cm/s
LCCAPDIAS: 30 cm/s
LEFT ECA DIAS: -19 cm/s
LICAPSYS: -54 cm/s
Left CCA prox sys: 83 cm/s
Left ICA dist sys: -61 cm/s
Left ICA prox dias: -20 cm/s
RCCAPSYS: 74 cm/s
RIGHT CCA MID DIAS: 35 cm/s
RIGHT ECA DIAS: -25 cm/s
Right CCA prox dias: 25 cm/s
Right cca dist sys: -59 cm/s

## 2017-08-27 NOTE — Patient Instructions (Signed)

## 2017-08-27 NOTE — Progress Notes (Signed)
[facility] [place]  CC: Follow up s/p Endovascular Repair of Abdominal Aortic Aneurysm and extracranial carotid artery stenosis   History of Present Illness  Wesley [name] is a 72 y.o. (09-01-1945) male returns for follow-up today. He previously underwent left carotid endarterectomy August 2015 by Dr. Oneida Alar. He also underwent endovascular aneurysm repair of an aortic aneurysm July 2015 by Dr. Oneida Alar. His aneurysm had shrunk from 5.7 cm to 3.5 cm.  He denies any abdominal or back pain.  He denies any episodes of TIA amaurosis or stroke.  He is taking aspirin daily. He is also on Eliquis (was on Savaysa) for atrial fib.   He had one stroke in 1987, he denies any residual deficits from the stroke, and had not had any subsequent stroke or TIA.   Review of systems: He denies shortness of breath or chest pain.   Pt had a crush injury to his right hand many years ago, since then his right hand has had a tremor.  Pt is scheduled to see a podiatrist in the next couple of weeks to trim his thick long toenails.   Dr. Oneida Alar last evaluated pt on 05-24-17. At that time carotid duplex demonstrated 40-60% right internal carotid artery stenosis, no significant left internal carotid artery stenosis. Ultrasound of abdominal aorta showed aneurysm diameter of 3.7 cm.  Pt was to follow-up in one year with repeat carotid duplex exam and an ultrasound of his aortic stent graft.  Diabetic: No Tobaccos use: former smoker, quit in 2009, smoked x 58 years   Past Medical History:  Diagnosis Date  . AAA (abdominal aortic aneurysm) ([place])   . Arthritis    "legs" (01/19/2014)  . Back pain    reason unknown  . Carotid artery occlusion   . Chronic back pain    "neck to tailbone" (01/19/2014)  . Coronary artery disease   . Dysphagia   . Dysrhythmia    Atrial Fib-takes Atenolol daily  . History of blood transfusion   . History of bronchitis 23yrs ago  . History of colon polyps   .  Hyperlipidemia    takes Zocor daily  . Hypertension    takes Clonidine,Apresoline,Imdur,and Lisinopril daily  . Schizophrenia ([place])    takes Haldol daily  . Seizures ([place])    has had one several yrs ago but never been on meds  . Stroke [facility]) 04/16/1986   takes Plavix daily as well as Savaysa;right sided weakness   Past Surgical History:  Procedure Laterality Date  . ABDOMINAL AORTIC ENDOVASCULAR STENT GRAFT N/A 02/01/2014   Procedure: ABDOMINAL AORTIC ENDOVASCULAR STENT GRAFT;  Surgeon: Elam Dutch, MD;  Location: [place];  Service: Vascular;  Laterality: N/A;  . CARPAL TUNNEL RELEASE Right   . COLONOSCOPY    . CORONARY ANGIOPLASTY WITH STENT PLACEMENT  01/19/2014   "1"  . ENDARTERECTOMY Left 03/16/2014   Procedure: LEFT CAROTID ENDARTERECTOMY WITH PATCH ANGIOPLASTY ;  Surgeon: Elam Dutch, MD;  Location: [place];  Service: Vascular;  Laterality: Left;  . EYE SURGERY Bilateral    cataract surgery  . LEFT HEART CATHETERIZATION WITH CORONARY ANGIOGRAM N/A 01/19/2014   Procedure: LEFT HEART CATHETERIZATION WITH CORONARY ANGIOGRAM;  Surgeon: Laverda Page, MD;  Location: [facility] CATH LAB;  Service: Cardiovascular;  Laterality: N/A;  . PERCUTANEOUS CORONARY STENT INTERVENTION (PCI-S)  01/19/2014   Procedure: PERCUTANEOUS CORONARY STENT INTERVENTION (PCI-S);  Surgeon: Laverda Page, MD;  Location[facility] CATH LAB;  Service: Cardiovascular;;   Social History  Social History   Tobacco Use  . Smoking status: Former Smoker    Packs/day: 1.00    Years: 58.00    Pack years: 58.00    Types: Cigarettes  . Smokeless tobacco: Never Used  . Tobacco comment: quit smoking a couple of yrs ago  Substance Use Topics  . Alcohol use: No    Alcohol/week: 0.0 oz  . Drug use: No   Family History Family History  Problem Relation Age of Onset  . Cancer Father   . Hyperlipidemia Father   . Hypertension Father   . Heart disease Sister   . AAA (abdominal aortic aneurysm) Sister    Current  Outpatient Medications on File Prior to Visit  Medication Sig Dispense Refill  . atenolol (TENORMIN) 100 MG tablet Take 1 tablet (100 mg total) by mouth 2 (two) times daily. 180 tablet 1  . clopidogrel (PLAVIX) 75 MG tablet Take 1 tablet (75 mg total) by mouth daily with breakfast. 30 tablet 6  . Edoxaban Tosylate (SAVAYSA) 60 MG TABS Take 60 mg by mouth daily.    Marland Kitchen ELIQUIS 5 MG TABS tablet Take 5 mg by mouth 2 (two) times daily.  2  . haloperidol (HALDOL) 5 MG tablet Take 2.5 mg by mouth at bedtime.     . hydrALAZINE (APRESOLINE) 50 MG tablet Take 50 mg by mouth 3 (three) times daily.     . isosorbide mononitrate (IMDUR) 60 MG 24 hr tablet Take 60 mg by mouth at bedtime.     Marland Kitchen lisinopril-hydrochlorothiazide (PRINZIDE,ZESTORETIC) 20-25 MG per tablet Take 1 tablet by mouth daily. Take every morning    . metoprolol (TOPROL-XL) 200 MG 24 hr tablet Take 200 mg by mouth daily.  1  . oxyCODONE (OXY IR/ROXICODONE) 5 MG immediate release tablet Take 1 tablet (5 mg total) by mouth every 6 (six) hours as needed for severe pain. 20 tablet 0  . simvastatin (ZOCOR) 20 MG tablet Take 20 mg by mouth at bedtime.   3  . trihexyphenidyl (ARTANE) 2 MG tablet Take 2 mg by mouth at bedtime.     Marland Kitchen oxyCODONE-acetaminophen (PERCOCET) 5-325 MG per tablet Take 2 tablets by mouth every 4 (four) hours as needed. (Patient not taking: Reported on 08/27/2017) 6 tablet 0   No current facility-administered medications on file prior to visit.    No Known Allergies   ROS: See HPI for pertinent positives and negatives.  Physical Examination  Vitals:   08/27/17 0903 08/27/17 0907  BP: 112/81 120/84  Pulse: 90 94  Resp: 16   Temp: 98.7 F (37.1 C)   TempSrc: Oral   SpO2: 99%   Weight: 187 lb (84.8 kg)   Height: 6\' 1"  (1.854 m)    Body mass index is 24.67 kg/m.  General: A&O x 3, WD, elderly male. HENT: No gross abnormalities  Eyes: PERRLA Pulmonary: Sym exp, respirations are non labored, good air movt, CTAB, no  rales, rhonchi, or wheezing. Cardiac: Irregular rhythm, controlled rate, no murmur appreciated  Vascular: Vessel Right Left  Radial Not Palpable 2+Palpable  Brachial 2+Palpable 2+Palpable  Carotid  without bruit  without bruit  Aorta Not palpable N/A  Femoral 2+Palpable 2+Palpable  Popliteal Not palpable Not palpable  PT Not Palpable 1+Palpable  DP 2+Palpable 1+Palpable   Gastrointestinal: soft, NTND, -G/R, - HSM, - palpable masses, - CVAT B. Musculoskeletal: M/S 5/5 throughout, extremities without ischemic changes. Long thick toenails.  Skin: No rashes, no ulcers, no cellulitis.   Neurologic: Pain and light  touch intact in extremities, Motor exam as listed above. Resting tremor in right hand. Psychiatric: Normal thought content, mood appropriate for clinical situation.     DATA  Carotid Duplex (08/27/17): Right ICA: 1-39% stenosis Left ICA: (CEA site) 1-39% stenosis Bilateral vertebral artery flow is antegrade.  Bilateral subclavian artery waveforms are normal.  Decrease in stenosis category of the right ICA compared to the exam on 05-24-16.   EVAR Duplex (Date: 08/27/17):  AAA sac size: 3.1 cm; Right CIA: 1.5 cm; Left CIA: 1.5 cm   no endoleak detected  Previous (05-24-16): 3.7 cm    Medical Decision Making  Wesley [name] is a 72 y.o. male who is s/p left carotid endarterectomy in August 2015. He  Is also s/p endovascular aneurysm repair of an aortic aneurysm July 2015. Pt is asymptomatic with a decrease in sac size to 3.1 cm today, from 3.7 cm on 05-24-16.  I discussed with the patient the importance of surveillance of the endograft.  The next endograft duplex will be scheduled for 12 months, carotid duplex also.  The patient will follow up with [place] in 12 months with these studies.  I emphasized the importance of maximal medical management including strict control of blood pressure, blood glucose, and lipid levels, antiplatelet agents, obtaining regular exercise,  and cessation of smoking.   Thank you for allowing [place] to participate in this patient's care.  Clemon Chambers, RN, MSN, FNP-C [facility] [place] Office: [phone]  Clinic Physician: Early  08/27/2017, 9:16 AM

## 2017-09-04 ENCOUNTER — Encounter: Payer: Self-pay | Admitting: Podiatry

## 2017-09-04 ENCOUNTER — Ambulatory Visit (INDEPENDENT_AMBULATORY_CARE_PROVIDER_SITE_OTHER): Payer: Medicare Other | Admitting: Podiatry

## 2017-09-04 DIAGNOSIS — Q828 Other specified congenital malformations of skin: Secondary | ICD-10-CM

## 2017-09-04 DIAGNOSIS — M79676 Pain in unspecified toe(s): Secondary | ICD-10-CM | POA: Diagnosis not present

## 2017-09-04 DIAGNOSIS — B351 Tinea unguium: Secondary | ICD-10-CM | POA: Diagnosis not present

## 2017-09-04 NOTE — Progress Notes (Signed)
Patient ID: Wesley [name], male   DOB: 10/07/1945, 71 y.o.   MRN: [id number] Complaint:  Visit Type: Patient returns to my office for continued preventative foot care services. Complaint: Patient states" my nails have grown long and thick and become painful to walk and wear shoes" . He presents for preventative foot care services. No changes to ROS.  Patient is taking eliquiss and plavix.  Podiatric Exam: Vascular: dorsalis pedis and posterior tibial pulses are palpable bilateral. Capillary return is immediate. Temperature gradient is WNL. Skin turgor WNL  Sensorium: Normal Semmes Weinstein monofilament test. Normal tactile sensation bilaterally. Nail Exam: Pt has thick disfigured discolored nails with subungual debris noted bilateral entire nail hallux through fifth toenails Ulcer Exam: There is no evidence of ulcer or pre-ulcerative changes or infection. Orthopedic Exam: Muscle tone and strength are WNL. No limitations in general ROM. No crepitus or effusions noted. Foot type and digits show no abnormalities. Bony prominences are unremarkable. Skin:  Asymptomatic  Porokeratosis sub 5th metatarsal right foot..Symptomatic pinch callus hallux  B/L No infection or ulcers.  .  Diagnosis:  Tinea unguium, Pain in right toe, pain in left toes. Callus  B/L .  Treatment & Plan Procedures and Treatment: Consent by patient was obtained for treatment procedures. The patient understood the discussion of treatment and procedures well. All questions were answered thoroughly reviewed. Debridement of mycotic and hypertrophic toenails, 1 through 5 bilateral and clearing of subungual debris. No ulceration, no infection noted. Debride callus. Return Visit-Office Procedure: Patient instructed to return to the office for a follow up visit 3 months for continued evaluation and treatment.  Satchel Heidinger DPM 

## 2017-12-04 ENCOUNTER — Ambulatory Visit: Payer: Commercial Indemnity | Admitting: Podiatry

## 2018-02-19 DIAGNOSIS — E7800 Pure hypercholesterolemia, unspecified: Secondary | ICD-10-CM | POA: Diagnosis not present

## 2018-02-19 DIAGNOSIS — I10 Essential (primary) hypertension: Secondary | ICD-10-CM | POA: Diagnosis not present

## 2018-02-19 DIAGNOSIS — Z95828 Presence of other vascular implants and grafts: Secondary | ICD-10-CM | POA: Diagnosis not present

## 2018-02-19 DIAGNOSIS — I25119 Atherosclerotic heart disease of native coronary artery with unspecified angina pectoris: Secondary | ICD-10-CM | POA: Diagnosis not present

## 2018-02-19 DIAGNOSIS — I482 Chronic atrial fibrillation: Secondary | ICD-10-CM | POA: Diagnosis not present

## 2018-03-17 DIAGNOSIS — F205 Residual schizophrenia: Secondary | ICD-10-CM | POA: Diagnosis not present

## 2018-03-26 ENCOUNTER — Ambulatory Visit (INDEPENDENT_AMBULATORY_CARE_PROVIDER_SITE_OTHER): Payer: Medicare Other | Admitting: Podiatry

## 2018-03-26 ENCOUNTER — Encounter: Payer: Self-pay | Admitting: Podiatry

## 2018-03-26 DIAGNOSIS — B351 Tinea unguium: Secondary | ICD-10-CM | POA: Diagnosis not present

## 2018-03-26 DIAGNOSIS — M79676 Pain in unspecified toe(s): Secondary | ICD-10-CM | POA: Diagnosis not present

## 2018-03-26 DIAGNOSIS — D689 Coagulation defect, unspecified: Secondary | ICD-10-CM

## 2018-03-26 NOTE — Progress Notes (Signed)
Patient ID: Wesley [name], male   DOB: [birth year], 72 y.o.   MRN: [id number] Complaint:  Visit Type: Patient returns to my office for continued preventative foot care services. Complaint: Patient states" my nails have grown long and thick and become painful to walk and wear shoes" . He presents for preventative foot care services. No changes to ROS.  Patient is taking eliquiss and plavix.  Podiatric Exam: Vascular: dorsalis pedis and posterior tibial pulses are palpable bilateral. Capillary return is immediate. Temperature gradient is WNL. Skin turgor WNL  Sensorium: Normal Semmes Weinstein monofilament test. Normal tactile sensation bilaterally. Nail Exam: Pt has thick disfigured discolored nails with subungual debris noted bilateral entire nail hallux through fifth toenails Ulcer Exam: There is no evidence of ulcer or pre-ulcerative changes or infection. Orthopedic Exam: Muscle tone and strength are WNL. No limitations in general ROM. No crepitus or effusions noted. Foot type and digits show no abnormalities. Bony prominences are unremarkable. Skin:  Asymptomatic  Porokeratosis sub 5th metatarsal right foot..Symptomatic pinch callus hallux  B/L No infection or ulcers.  .  Diagnosis:  Tinea unguium, Pain in right toe, pain in left toes. Callus  B/L .  Treatment & Plan Procedures and Treatment: Consent by patient was obtained for treatment procedures. The patient understood the discussion of treatment and procedures well. All questions were answered thoroughly reviewed. Debridement of mycotic and hypertrophic toenails, 1 through 5 bilateral and clearing of subungual debris. No ulceration, no infection noted. Debride callus. Return Visit-Office Procedure: Patient instructed to return to the office for a follow up visit 3 months for continued evaluation and treatment.  Gardiner Barefoot DPM

## 2018-10-01 ENCOUNTER — Ambulatory Visit (INDEPENDENT_AMBULATORY_CARE_PROVIDER_SITE_OTHER): Payer: Medicare Other | Admitting: Podiatry

## 2018-10-01 ENCOUNTER — Encounter: Payer: Self-pay | Admitting: Podiatry

## 2018-10-01 ENCOUNTER — Other Ambulatory Visit (HOSPITAL_COMMUNITY): Payer: Self-pay | Admitting: Psychiatry

## 2018-10-01 DIAGNOSIS — Q828 Other specified congenital malformations of skin: Secondary | ICD-10-CM | POA: Diagnosis not present

## 2018-10-01 DIAGNOSIS — M79676 Pain in unspecified toe(s): Secondary | ICD-10-CM

## 2018-10-01 DIAGNOSIS — D689 Coagulation defect, unspecified: Secondary | ICD-10-CM | POA: Diagnosis not present

## 2018-10-01 DIAGNOSIS — B351 Tinea unguium: Secondary | ICD-10-CM | POA: Diagnosis not present

## 2018-10-01 NOTE — Progress Notes (Signed)
Patient ID: Wesley [name], male   DOB: [birth year], 73 y.o.   MRN: [id number] Complaint:  Visit Type: Patient returns to my office for continued preventative foot care services. Complaint: Patient states" my nails have grown long and thick and become painful to walk and wear shoes" . He presents for preventative foot care services. No changes to ROS.  Patient is taking eliquiss and plavix.  Podiatric Exam: Vascular: dorsalis pedis and posterior tibial pulses are palpable bilateral. Capillary return is immediate. Temperature gradient is WNL. Skin turgor WNL  Sensorium: Normal Semmes Weinstein monofilament test. Normal tactile sensation bilaterally. Nail Exam: Pt has thick disfigured discolored nails with subungual debris noted bilateral entire nail hallux through fifth toenails Ulcer Exam: There is no evidence of ulcer or pre-ulcerative changes or infection. Orthopedic Exam: Muscle tone and strength are WNL. No limitations in general ROM. No crepitus or effusions noted. Foot type and digits show no abnormalities. Bony prominences are unremarkable. Skin:  Asymptomatic  Porokeratosis sub 5th metatarsal right foot..Symptomatic pinch callus hallux  B/L No infection or ulcers.  .  Diagnosis:  Tinea unguium, Pain in right toe, pain in left toes. Porokeratosis right foot  Treatment & Plan Procedures and Treatment: Consent by patient was obtained for treatment procedures. The patient understood the discussion of treatment and procedures well. All questions were answered thoroughly reviewed. Debridement of mycotic and hypertrophic toenails, 1 through 5 bilateral and clearing of subungual debris. No ulceration, no infection noted. Debride callus. Return Visit-Office Procedure: Patient instructed to return to the office for a follow up visit 3 months for continued evaluation and treatment.  Gardiner Barefoot DPM

## 2018-10-27 ENCOUNTER — Other Ambulatory Visit (HOSPITAL_COMMUNITY): Payer: Self-pay | Admitting: Psychiatry

## 2018-11-04 DIAGNOSIS — F205 Residual schizophrenia: Secondary | ICD-10-CM | POA: Diagnosis not present

## 2018-12-03 ENCOUNTER — Ambulatory Visit (INDEPENDENT_AMBULATORY_CARE_PROVIDER_SITE_OTHER): Payer: Medicare Other | Admitting: Podiatry

## 2018-12-03 ENCOUNTER — Other Ambulatory Visit: Payer: Self-pay

## 2018-12-03 ENCOUNTER — Encounter: Payer: Self-pay | Admitting: Podiatry

## 2018-12-03 VITALS — Temp 97.5°F

## 2018-12-03 DIAGNOSIS — D689 Coagulation defect, unspecified: Secondary | ICD-10-CM

## 2018-12-03 DIAGNOSIS — M79676 Pain in unspecified toe(s): Secondary | ICD-10-CM | POA: Diagnosis not present

## 2018-12-03 DIAGNOSIS — B351 Tinea unguium: Secondary | ICD-10-CM

## 2018-12-03 NOTE — Progress Notes (Addendum)
Patient ID: Wesley [name], male   DOB: [birth year], 73 y.o.   MRN: [id number] Complaint:  Visit Type: Patient returns to my office for continued preventative foot care services. Complaint: Patient states" my nails have grown long and thick and become painful to walk and wear shoes" . He presents for preventative foot care services. No changes to ROS.  Patient is taking eliquiss and plavix.  Podiatric Exam: Vascular: dorsalis pedis and posterior tibial pulses are palpable bilateral. Capillary return is immediate. Temperature gradient is WNL. Skin turgor WNL  Sensorium: Normal Semmes Weinstein monofilament test. Normal tactile sensation bilaterally. Nail Exam: Pt has thick disfigured discolored nails with subungual debris noted bilateral entire nail hallux through fifth toenails Ulcer Exam: There is no evidence of ulcer or pre-ulcerative changes or infection. Orthopedic Exam: Muscle tone and strength are WNL. No limitations in general ROM. No crepitus or effusions noted. Foot type and digits show no abnormalities. Bony prominences are unremarkable. Skin:  Asymptomatic  Porokeratosis sub 5th metatarsal right foot.Asymptomatic pinch callus hallux  B/L No infection or ulcers.  .  Diagnosis:  Tinea unguium, Pain in right toe, pain in left toes. Porokeratosis right foot  Treatment & Plan Procedures and Treatment: Consent by patient was obtained for treatment procedures. The patient understood the discussion of treatment and procedures well. All questions were answered thoroughly reviewed. Debridement of mycotic and hypertrophic toenails, 1 through 5 bilateral and clearing of subungual debris. No ulceration, no infection noted. Return Visit-Office Procedure: Patient instructed to return to the office for a follow up visit 10 weeks  for continued evaluation and treatment.  Gardiner Barefoot DPM

## 2019-01-20 ENCOUNTER — Other Ambulatory Visit: Payer: Self-pay | Admitting: Cardiology

## 2019-01-20 NOTE — Telephone Encounter (Signed)
Please fill

## 2019-02-18 ENCOUNTER — Ambulatory Visit: Payer: Medicare Other | Admitting: Podiatry

## 2019-02-18 ENCOUNTER — Ambulatory Visit: Payer: Self-pay | Admitting: Cardiology

## 2019-03-04 ENCOUNTER — Other Ambulatory Visit: Payer: Self-pay

## 2019-03-04 DIAGNOSIS — I482 Chronic atrial fibrillation, unspecified: Secondary | ICD-10-CM

## 2019-03-04 DIAGNOSIS — E7800 Pure hypercholesterolemia, unspecified: Secondary | ICD-10-CM

## 2019-03-04 MED ORDER — SIMVASTATIN 20 MG PO TABS: 20.0000 mg | ORAL_TABLET | Freq: Every day | ORAL | 3 refills | Status: DC

## 2019-03-04 MED ORDER — METOPROLOL SUCCINATE ER 200 MG PO TB24: 200.0000 mg | ORAL_TABLET | Freq: Every day | ORAL | 1 refills | Status: DC

## 2019-03-06 ENCOUNTER — Other Ambulatory Visit: Payer: Self-pay

## 2019-03-06 MED ORDER — ISOSORBIDE MONONITRATE ER 60 MG PO TB24: 60.0000 mg | ORAL_TABLET | Freq: Every day | ORAL | 0 refills | Status: DC

## 2019-03-09 ENCOUNTER — Other Ambulatory Visit: Payer: Self-pay

## 2019-03-09 ENCOUNTER — Encounter: Payer: Self-pay | Admitting: Cardiology

## 2019-03-09 ENCOUNTER — Telehealth (INDEPENDENT_AMBULATORY_CARE_PROVIDER_SITE_OTHER): Payer: Self-pay | Admitting: Cardiology

## 2019-03-09 DIAGNOSIS — I25119 Atherosclerotic heart disease of native coronary artery with unspecified angina pectoris: Secondary | ICD-10-CM

## 2019-03-09 NOTE — Progress Notes (Signed)
Primary Physician:  Nolene Ebbs, MD   Patient ID: Wesley [name], male    DOB: 03-Dec-1945, 73 y.o.   MRN: [id number]  Subjective:    No chief complaint on file.   This visit type was conducted due to national recommendations for restrictions regarding the COVID-19 Pandemic (e.g. social distancing).  This format is felt to be most appropriate for this patient at this time.  All issues noted in this document were discussed and addressed.  No physical exam was performed (except for noted visual exam findings with Telehealth visits).  The patient has consented to conduct a Telehealth visit and understands insurance will be billed.   I discussed the limitations of evaluation and management by telemedicine and the availability of in person appointments. The patient expressed understanding and agreed to proceed.  Virtual Visit via Video Note is as below  I connected with Mr. [name], on 03/12/19 at 1500 by telephone and verified that I am speaking with the correct person using two identifiers. Unable to perform video visit as patient did not have equipment.    I have discussed with the patient regarding the safety during COVID Pandemic and steps and precautions including social distancing with the patient.    HPI: Wesley [name]  is a 73 y.o. male  with history of hypertension, hyperlipidemia and history of presenile dementia, Parkinsons, chronic A. Fibrillation, CAD and h/o baremetal stenting to the RCA on 01/19/2013. He also has endovascular abdominal aortic aneurysm repair by Dr. Oneida [name] on 02/01/2014, and left carotid endarterectomy on 03/16/2014. He also has prior h/o stroke left brain and residual mild right hemiparesis.   Patient was seen by vascular surgery in January 2019 revealing decrease in abdominal aortic aneurysm to 3.1 cm, previously 3.7 cm in 2017. Also has had decreased and right ICA stenosis to 1-39%. Left ICA has remained stable. It does not appear that he has seen them.   This is a one year telephone appt. Patient appears to be confused on our conversation today and I was unable to reach his son, who was in [place] at the time. Patient did not know when he had last had labs performed.   Past Medical History:  Diagnosis Date  . AAA (abdominal aortic aneurysm) ([place])   . Arthritis    "legs" (01/19/2014)  . Back pain    reason unknown  . Carotid artery occlusion   . Chronic back pain    "neck to tailbone" (01/19/2014)  . Coronary artery disease   . Dysphagia   . Dysrhythmia    Atrial Fib-takes Atenolol daily  . History of blood transfusion   . History of bronchitis 47yrs ago  . History of colon polyps   . Hyperlipidemia    takes Zocor daily  . Hypertension    takes Clonidine,Apresoline,Imdur,and Lisinopril daily  . Schizophrenia ([place])    takes Haldol daily  . Seizures ([place])    has had one several yrs ago but never been on meds  . Stroke [facility]) 04/16/1986   takes Plavix daily as well as Savaysa;right sided weakness    Past Surgical History:  Procedure Laterality Date  . ABDOMINAL AORTIC ENDOVASCULAR STENT GRAFT N/A 02/01/2014   Procedure: ABDOMINAL AORTIC ENDOVASCULAR STENT GRAFT;  Surgeon: Wesley Dutch, MD;  Location: [place];  Service: Vascular;  Laterality: N/A;  . CARPAL TUNNEL RELEASE Right   . COLONOSCOPY    . CORONARY ANGIOPLASTY WITH STENT PLACEMENT  01/19/2014   "1"  . ENDARTERECTOMY Left 03/16/2014  Procedure: LEFT CAROTID ENDARTERECTOMY WITH PATCH ANGIOPLASTY ;  Surgeon: Wesley Dutch, MD;  Location: [place];  Service: Vascular;  Laterality: Left;  . EYE SURGERY Bilateral    cataract surgery  . LEFT HEART CATHETERIZATION WITH CORONARY ANGIOGRAM N/A 01/19/2014   Procedure: LEFT HEART CATHETERIZATION WITH CORONARY ANGIOGRAM;  Surgeon: Wesley Page, MD;  Location: [facility] CATH LAB;  Service: Cardiovascular;  Laterality: N/A;  . PERCUTANEOUS CORONARY STENT INTERVENTION (PCI-S)  01/19/2014   Procedure: PERCUTANEOUS CORONARY STENT  INTERVENTION (PCI-S);  Surgeon: Wesley Page, MD;  Location: [facility] C[facility] LAB;  Service: Cardiovascular;;    Social History   Socioeconomic History  . Marital status: Widowed    Spouse name: Not on file  . Number of children: Not on file  . Years of education: Not on file  . Highest education level: Not on file  Occupational History  . Not on file  Social Needs  . Financial resource strain: Not on file  . Food insecurity    Worry: Not on file    Inability: Not on file  . Transportation needs    Medical: Not on file    Non-medical: Not on file  Tobacco Use  . Smoking status: Former Smoker    Packs/day: 1.00    Years: 58.00    Pack years: 58.00    Types: Cigarettes  . Smokeless tobacco: Never Used  . Tobacco comment: quit smoking a couple of yrs ago  Substance and Sexual Activity  . Alcohol use: No    Alcohol/week: 0.0 standard drinks  . Drug use: No  . Sexual activity: Not on file  Lifestyle  . Physical activity    Days per week: Not on file    Minutes per session: Not on file  . Stress: Not on file  Relationships  . Social Herbalist on phone: Not on file    Gets together: Not on file    Attends religious service: Not on file    Active member of club or organization: Not on file    Attends meetings of clubs or organizations: Not on file    Relationship status: Not on file  . Intimate partner violence    Fear of current or ex partner: Not on file    Emotionally abused: Not on file    Physically abused: Not on file    Forced sexual activity: Not on file  Other Topics Concern  . Not on file  Social History Narrative  . Not on file    Review of Systems  Constitution: Negative for decreased appetite, malaise/fatigue, weight gain and weight loss.  Eyes: Negative for visual disturbance.  Cardiovascular: Negative for chest pain, claudication, dyspnea on exertion, leg swelling, orthopnea, palpitations and syncope.  Respiratory: Negative for hemoptysis  and wheezing.   Endocrine: Negative for cold intolerance and heat intolerance.  Hematologic/Lymphatic: Does not bruise/bleed easily.  Skin: Negative for nail changes.  Musculoskeletal: Negative for muscle weakness and myalgias.  Gastrointestinal: Negative for abdominal pain, change in bowel habit, nausea and vomiting.  Neurological: Positive for loss of balance and tremors. Negative for difficulty with concentration, dizziness, focal weakness and headaches.  Psychiatric/Behavioral: Positive for memory loss. Negative for altered mental status and suicidal ideas.  All other systems reviewed and are negative.     Objective:  There were no vitals taken for this visit. There is no height or weight on file to calculate BMI.     Physical exam not performed or limited  due to virtual visit.  Please see exam details from prior visit is as below.  Physical Exam  Constitutional: He is oriented to person, place, and time. Vital signs are normal. He appears well-developed and well-nourished.  HENT:  Head: Normocephalic and atraumatic.  Neck: Normal range of motion.  Cardiovascular: Normal rate, normal heart sounds and intact distal pulses. An irregularly irregular rhythm present.  Pulses:      Femoral pulses are 2+ on the right side and 2+ on the left side.      Popliteal pulses are 1+ on the right side and 1+ on the left side.       Dorsalis pedis pulses are 1+ on the right side and 2+ on the left side.       Posterior tibial pulses are 1+ on the right side and 1+ on the left side.  Pulmonary/Chest: Effort normal and breath sounds normal. No accessory muscle usage. No respiratory distress.  Abdominal: Soft. Bowel sounds are normal.  Musculoskeletal: Normal range of motion.  Neurological: He is alert and oriented to person, place, and time.  Skin: Skin is warm and dry.  Vitals reviewed.  Radiology: No results found.  Laboratory examination:    CMP Latest Ref Rng & Units 02/06/2015 02/05/2015  08/31/2014  Glucose 65 - 99 mg/dL 95 106(H) 86  BUN 6 - 20 mg/dL 66(H) 82(H) 30(H)  Creatinine 0.61 - 1.24 mg/dL 2.78(H) 5.46(H) 1.50(H)  Sodium 135 - 145 mmol/L 138 133(L) 141  Potassium 3.5 - 5.1 mmol/L 4.6 4.8 4.1  Chloride 101 - 111 mmol/L 109 101 103  CO2 22 - 32 mmol/L 21(L) 20(L) -  Calcium 8.9 - 10.3 mg/dL 9.2 9.5 -  Total Protein 6.5 - 8.1 g/dL - 7.1 -  Total Bilirubin 0.3 - 1.2 mg/dL - 0.4 -  Alkaline Phos 38 - 126 U/L - 67 -  AST 15 - 41 U/L - 22 -  ALT 17 - 63 U/L - 20 -   CBC Latest Ref Rng & Units 02/06/2015 02/05/2015 08/31/2014  WBC 4.0 - 10.5 K/uL 6.2 7.7 -  Hemoglobin 13.0 - 17.0 g/dL 13.5 14.6 15.6  Hematocrit 39.0 - 52.0 % 39.0 42.3 46.0  Platelets 150 - 400 K/uL 241 280 -   Lipid Panel  No results found for: CHOL, TRIG, HDL, CHOLHDL, VLDL, LDLCALC, LDLDIRECT HEMOGLOBIN A1C No results found for: HGBA1C, MPG TSH No results for input(s): TSH in the last 8760 hours.  PRN Meds:. There are no discontinued medications. No outpatient medications have been marked as taking for the 03/09/19 encounter (Appointment) with Miquel Dunn, NP.    Cardiac Studies:   Carotid Duplex (08/27/17): Right ICA: 1-39% stenosis Left ICA: (CEA site) 1-39% stenosis Bilateral vertebral artery flow is antegrade.  Bilateral subclavian artery waveforms are normal.  Decrease in stenosis category of the right ICA compared to the exam on 05-24-16.   EVAR Duplex (Date: 08/27/17):  AAA sac size: 3.1 cm; Right CIA: 1.5 cm; Left CIA: 1.5 cm   no endoleak detected  Previous (05-24-16): 3.7 cm   Lexiscan Myoview stress test 10/23/2013: 1. Resting EKG A. Fibrillation with controlled ventricular response, LVH, diffuse nonspecific ST-T wave changes, PVC. Stress EKG was non diagnostic for ischemia. No ST-T changes of ischemia noted with pharmacologic stress testing. Stress symptoms included right arm pain. Stress terminated due to completion of protocol. 2. The perfusion study demonstrated  a moderate-sized severe ischemia in the mid inferior wall and inferolateral wall. The ischemia was statistically  significant with a sum score difference of 4. Dynamic QGS images revealed the Left ventricular ejection fraction was estimated to be 61%. This represents at least intermediate risk With patient developing left arm pain and markedly abnormal perfusion abnormality.  Echo- 11/12/13 1. Left ventricle cavity is normal in size. Mild Concentric hypertrophy of the left ventricle. Normal global wall motion. Calculated EF is 62% No wall motion abnormalities. Left atrial cavity is mildly dilated. Right atrial cavity is mildly dilated. 2. Moderate mitral regurgitation. Mild calcification of the mitral valve annulus. 3. Normal tricuspid valve with moderate regurgitation. Mild to moderate pulmonary hypertension with approx. PA syst. pressure of 40 mm of Hg.   Coronary angiogram 01/19/2014: Mid RCA 4x24 mm Rebel Non-DES stent. Mild disease left. EF 55%  Assessment:     ICD-10-CM   1. Atherosclerosis of native coronary artery of native heart with angina pectoris ([place] [facility])  I25.119   2. Chronic atrial fibrillation  I48.20   3. Essential hypertension  I10   4. History of repair of aneurysm of abdominal aorta using endovascular stent graft  Z95.828   5. Hyperlipidemia, group A  E78.00      EKG 02/19/2018: Atrial fibrillation with controlled response at the rate of 82 bpm, normal axis, no evidence of ischemia. No significant change from EKG 11/08/2016  Recommendations:   As patient seemed to be confused today, did not have a BP cuff, and I was unable to reach his son for more information, I felt that telephone appt for this patient was not appropriate and will make this a no charge visit. I will order basic labs to be performed if he has not had any labs with his PCP. Will reschedule for an in office visit for follow up.   Miquel Dunn, MSN, APRN, FNP-C [facility]. [place] Office:  [phone] Fax: [phone]

## 2019-03-12 DIAGNOSIS — E7800 Pure hypercholesterolemia, unspecified: Secondary | ICD-10-CM | POA: Insufficient documentation

## 2019-03-12 DIAGNOSIS — Z95828 Presence of other vascular implants and grafts: Secondary | ICD-10-CM | POA: Insufficient documentation

## 2019-03-12 DIAGNOSIS — I25119 Atherosclerotic heart disease of native coronary artery with unspecified angina pectoris: Secondary | ICD-10-CM | POA: Insufficient documentation

## 2019-03-12 DIAGNOSIS — I10 Essential (primary) hypertension: Secondary | ICD-10-CM | POA: Insufficient documentation

## 2019-04-20 ENCOUNTER — Other Ambulatory Visit: Payer: Self-pay

## 2019-04-20 MED ORDER — LISINOPRIL-HYDROCHLOROTHIAZIDE 20-25 MG PO TABS: 1.0000 | ORAL_TABLET | Freq: Every day | ORAL | 0 refills | Status: DC

## 2019-05-03 ENCOUNTER — Other Ambulatory Visit: Payer: Self-pay | Admitting: Cardiology

## 2019-05-03 DIAGNOSIS — E7800 Pure hypercholesterolemia, unspecified: Secondary | ICD-10-CM

## 2019-05-04 ENCOUNTER — Encounter: Payer: Self-pay | Admitting: Cardiology

## 2019-05-04 ENCOUNTER — Ambulatory Visit (INDEPENDENT_AMBULATORY_CARE_PROVIDER_SITE_OTHER): Payer: Medicare Other | Admitting: Cardiology

## 2019-05-04 ENCOUNTER — Other Ambulatory Visit: Payer: Self-pay

## 2019-05-04 VITALS — BP 121/79 | HR 90 | Ht 73.0 in | Wt 175.7 lb

## 2019-05-04 DIAGNOSIS — E7800 Pure hypercholesterolemia, unspecified: Secondary | ICD-10-CM

## 2019-05-04 DIAGNOSIS — I6523 Occlusion and stenosis of bilateral carotid arteries: Secondary | ICD-10-CM

## 2019-05-04 DIAGNOSIS — I714 Abdominal aortic aneurysm, without rupture, unspecified: Secondary | ICD-10-CM

## 2019-05-04 DIAGNOSIS — I2510 Atherosclerotic heart disease of native coronary artery without angina pectoris: Secondary | ICD-10-CM

## 2019-05-04 DIAGNOSIS — I482 Chronic atrial fibrillation, unspecified: Secondary | ICD-10-CM | POA: Diagnosis not present

## 2019-05-04 MED ORDER — APIXABAN 5 MG PO TABS: 5.0000 mg | ORAL_TABLET | Freq: Two times a day (BID) | ORAL | 6 refills | Status: DC

## 2019-05-04 MED ORDER — HYDRALAZINE HCL 50 MG PO TABS: 50.0000 mg | ORAL_TABLET | Freq: Three times a day (TID) | ORAL | 6 refills | Status: DC

## 2019-05-04 MED ORDER — SIMVASTATIN 20 MG PO TABS: ORAL_TABLET | ORAL | 6 refills | Status: DC

## 2019-05-04 MED ORDER — METOPROLOL SUCCINATE ER 200 MG PO TB24: 200.0000 mg | ORAL_TABLET | Freq: Every day | ORAL | 6 refills | Status: DC

## 2019-05-04 MED ORDER — ISOSORBIDE MONONITRATE ER 60 MG PO TB24: 60.0000 mg | ORAL_TABLET | Freq: Every day | ORAL | 6 refills | Status: DC

## 2019-05-04 MED ORDER — LISINOPRIL-HYDROCHLOROTHIAZIDE 20-25 MG PO TABS: 1.0000 | ORAL_TABLET | Freq: Every day | ORAL | 6 refills | Status: DC

## 2019-05-04 NOTE — Progress Notes (Signed)
Primary Physician:  Nolene Ebbs, MD   Patient ID: Wesley [name], male    DOB: 1945-11-06, 73 y.o.   MRN: [id number]  Subjective:    Chief Complaint  Patient presents with  . Atrial Fibrillation  . Coronary Artery Disease  . Follow-up    HPI: Abinav Clendenen  is a 73 y.o. male  with history of hypertension, hyperlipidemia and history of presenile dementia, Parkinsons, chronic A. Fibrillation, CAD and h/o baremetal stenting to the RCA on 01/19/2013. He also has endovascular abdominal aortic aneurysm repair by Dr. Oneida Alar on 02/01/2014, and left carotid endarterectomy on 03/16/2014. He also has prior h/o stroke left brain and residual mild right hemiparesis.   Patient was seen by vascular surgery in January 2019 revealing decrease in abdominal aortic aneurysm to 3.1 cm, previously 3.7 cm in 2017. Also has had decreased and right ICA stenosis to 1-39%. Left ICA has remained stable. It does not appear that he has seen them.  I had talked to him over the phone a few weeks ago, but patient seemed confused; therefore, I cancelled his appt and recommended in office follow up. Patient and his son, who is present at bedside, states that he is doing well. No complaints of chest pain or shortness of breath. He has not seen a physician in over a year.   Past Medical History:  Diagnosis Date  . AAA (abdominal aortic aneurysm) ([place])   . Arthritis    "legs" (01/19/2014)  . Back pain    reason unknown  . Carotid artery occlusion   . Chronic back pain    "neck to tailbone" (01/19/2014)  . Coronary artery disease   . Dysphagia   . Dysrhythmia    Atrial Fib-takes Atenolol daily  . History of blood transfusion   . History of bronchitis 39yrs ago  . History of colon polyps   . Hyperlipidemia    takes Zocor daily  . Hypertension    takes Clonidine,Apresoline,Imdur,and Lisinopril daily  . Schizophrenia ([place])    takes Haldol daily  . Seizures ([place])    has had one several yrs ago but never been on  meds  . Stroke [facility]) 04/16/1986   takes Plavix daily as well as Savaysa;right sided weakness    Past Surgical History:  Procedure Laterality Date  . ABDOMINAL AORTIC ENDOVASCULAR STENT GRAFT N/A 02/01/2014   Procedure: ABDOMINAL AORTIC ENDOVASCULAR STENT GRAFT;  Surgeon: Elam Dutch, MD;  Location: [facility];  Service: Vascular;  Laterality: N/A;  . CARPAL TUNNEL RELEASE Right   . COLONOSCOPY    . CORONARY ANGIOPLASTY WITH STENT PLACEMENT  01/19/2014   "1"  . ENDARTERECTOMY Left 03/16/2014   Procedure: LEFT CAROTID ENDARTERECTOMY WITH PATCH ANGIOPLASTY ;  Surgeon: Elam Dutch, MD;  Location: [place];  Service: Vascular;  Laterality: Left;  . EYE SURGERY Bilateral    cataract surgery  . LEFT HEART CATHETERIZATION WITH CORONARY ANGIOGRAM N/A 01/19/2014   Procedure: LEFT HEART CATHETERIZATION WITH CORONARY ANGIOGRAM;  Surgeon: Laverda Page, MD;  Location: [facility] CATH LAB;  Service: Cardiovascular;  Laterality: N/A;  . PERCUTANEOUS CORONARY STENT INTERVENTION (PCI-S)  01/19/2014   Procedure: PERCUTANEOUS CORONARY STENT INTERVENTION (PCI-S);  Surgeon: Laverda Page, MD;  Location: [facility] CATH LAB;  Service: Cardiovascular;;    Social History   Socioeconomic History  . Marital status: Widowed    Spouse name: Not on file  . Number of children: 3  . Years of education: Not on file  . Highest education level:  Not on file  Occupational History  . Not on file  Social Needs  . Financial resource strain: Not on file  . Food insecurity    Worry: Not on file    Inability: Not on file  . Transportation needs    Medical: Not on file    Non-medical: Not on file  Tobacco Use  . Smoking status: Former Smoker    Packs/day: 1.00    Years: 58.00    Pack years: 58.00    Types: Cigarettes  . Smokeless tobacco: Never Used  . Tobacco comment: quit smoking a couple of yrs ago  Substance and Sexual Activity  . Alcohol use: No    Alcohol/week: 0.0 standard drinks  . Drug use: No  . Sexual  activity: Not on file  Lifestyle  . Physical activity    Days per week: Not on file    Minutes per session: Not on file  . Stress: Not on file  Relationships  . Social Herbalist on phone: Not on file    Gets together: Not on file    Attends religious service: Not on file    Active member of club or organization: Not on file    Attends meetings of clubs or organizations: Not on file    Relationship status: Not on file  . Intimate partner violence    Fear of current or ex partner: Not on file    Emotionally abused: Not on file    Physically abused: Not on file    Forced sexual activity: Not on file  Other Topics Concern  . Not on file  Social History Narrative  . Not on file    Review of Systems  Constitution: Negative for decreased appetite, malaise/fatigue, weight gain and weight loss.  Eyes: Negative for visual disturbance.  Cardiovascular: Negative for chest pain, claudication, dyspnea on exertion, leg swelling, orthopnea, palpitations and syncope.  Respiratory: Negative for hemoptysis and wheezing.   Endocrine: Negative for cold intolerance and heat intolerance.  Hematologic/Lymphatic: Does not bruise/bleed easily.  Skin: Negative for nail changes.  Musculoskeletal: Negative for muscle weakness and myalgias.  Gastrointestinal: Negative for abdominal pain, change in bowel habit, nausea and vomiting.  Neurological: Positive for loss of balance and tremors. Negative for difficulty with concentration, dizziness, focal weakness and headaches.  Psychiatric/Behavioral: Positive for memory loss. Negative for altered mental status and suicidal ideas.  All other systems reviewed and are negative.     Objective:  Blood pressure 121/79, pulse 90, height 6\' 1"  (1.854 m), weight 175 lb 11.2 oz (79.7 kg), SpO2 97 %. Body mass index is 23.18 kg/m.     Physical Exam  Constitutional: He is oriented to person, place, and time. Vital signs are normal. He appears well-developed  and well-nourished.  HENT:  Head: Normocephalic and atraumatic.  Neck: Normal range of motion.  Cardiovascular: Normal rate, normal heart sounds and intact distal pulses. An irregularly irregular rhythm present.  Pulses:      Femoral pulses are 2+ on the right side and 2+ on the left side.      Popliteal pulses are 1+ on the right side and 1+ on the left side.       Dorsalis pedis pulses are 1+ on the right side and 2+ on the left side.       Posterior tibial pulses are 1+ on the right side and 1+ on the left side.  Pulmonary/Chest: Effort normal and breath sounds normal. No accessory muscle  usage. No respiratory distress.  Abdominal: Soft. Bowel sounds are normal.  Musculoskeletal: Normal range of motion.  Neurological: He is alert and oriented to person, place, and time.  Skin: Skin is warm and dry.  Vitals reviewed.  Radiology: No results found.  Laboratory examination:    CMP Latest Ref Rng & Units 02/06/2015 02/05/2015 08/31/2014  Glucose 65 - 99 mg/dL 95 106(H) 86  BUN 6 - 20 mg/dL 66(H) 82(H) 30(H)  Creatinine 0.61 - 1.24 mg/dL 2.78(H) 5.46(H) 1.50(H)  Sodium 135 - 145 mmol/L 138 133(L) 141  Potassium 3.5 - 5.1 mmol/L 4.6 4.8 4.1  Chloride 101 - 111 mmol/L 109 101 103  CO2 22 - 32 mmol/L 21(L) 20(L) -  Calcium 8.9 - 10.3 mg/dL 9.2 9.5 -  Total Protein 6.5 - 8.1 g/dL - 7.1 -  Total Bilirubin 0.3 - 1.2 mg/dL - 0.4 -  Alkaline Phos 38 - 126 U/L - 67 -  AST 15 - 41 U/L - 22 -  ALT 17 - 63 U/L - 20 -   CBC Latest Ref Rng & Units 02/06/2015 02/05/2015 08/31/2014  WBC 4.0 - 10.5 K/uL 6.2 7.7 -  Hemoglobin 13.0 - 17.0 g/dL 13.5 14.6 15.6  Hematocrit 39.0 - 52.0 % 39.0 42.3 46.0  Platelets 150 - 400 K/uL 241 280 -   Lipid Panel  No results found for: CHOL, TRIG, HDL, CHOLHDL, VLDL, LDLCALC, LDLDIRECT HEMOGLOBIN A1C No results found for: HGBA1C, MPG TSH No results for input(s): TSH in the last 8760 hours.  PRN Meds:. Medications Discontinued During This Encounter  Medication  Reason  . atenolol (TENORMIN) 100 MG tablet Error  . clopidogrel (PLAVIX) 75 MG tablet Error  . Edoxaban Tosylate (SAVAYSA) 60 MG TABS Error  . oxyCODONE (OXY IR/ROXICODONE) 5 MG immediate release tablet Error  . oxyCODONE-acetaminophen (PERCOCET) 5-325 MG per tablet Error   Current Meds  Medication Sig  . ELIQUIS 5 MG TABS tablet TAKE 1 TABLET BY MOUTH TWICE A DAY  . haloperidol (HALDOL) 5 MG tablet Take 2.5 mg by mouth at bedtime.   . hydrALAZINE (APRESOLINE) 50 MG tablet Take 50 mg by mouth 3 (three) times daily.   . isosorbide mononitrate (IMDUR) 60 MG 24 hr tablet TAKE 1 TABLET BY MOUTH AT BEDTIME  . lisinopril-hydrochlorothiazide (ZESTORETIC) 20-25 MG tablet Take 1 tablet by mouth daily. PLEASE CALL OFFICE FOR FURTHER REFILLS  . metoprolol (TOPROL-XL) 200 MG 24 hr tablet Take 1 tablet (200 mg total) by mouth daily.  . simvastatin (ZOCOR) 20 MG tablet TAKE 1 TABLET BY MOUTH EVERYDAY AT BEDTIME  . trihexyphenidyl (ARTANE) 2 MG tablet Take 2 mg by mouth at bedtime.     Cardiac Studies:   Carotid Duplex (08/27/17): Right ICA: 1-39% stenosis Left ICA: (CEA site) 1-39% stenosis Bilateral vertebral artery flow is antegrade.  Bilateral subclavian artery waveforms are normal.  Decrease in stenosis category of the right ICA compared to the exam on 05-24-16.   EVAR Duplex (Date: 08/27/17):  AAA sac size: 3.1 cm; Right CIA: 1.5 cm; Left CIA: 1.5 cm   no endoleak detected  Previous (05-24-16): 3.7 cm   Lexiscan Myoview stress test 10/23/2013: 1. Resting EKG A. Fibrillation with controlled ventricular response, LVH, diffuse nonspecific ST-T wave changes, PVC. Stress EKG was non diagnostic for ischemia. No ST-T changes of ischemia noted with pharmacologic stress testing. Stress symptoms included right arm pain. Stress terminated due to completion of protocol. 2. The perfusion study demonstrated a moderate-sized severe ischemia in the mid inferior  wall and inferolateral wall. The ischemia  was statistically significant with a sum score difference of 4. Dynamic QGS images revealed the Left ventricular ejection fraction was estimated to be 61%. This represents at least intermediate risk With patient developing left arm pain and markedly abnormal perfusion abnormality.  Echo- 11/12/13 1. Left ventricle cavity is normal in size. Mild Concentric hypertrophy of the left ventricle. Normal global wall motion. Calculated EF is 62% No wall motion abnormalities. Left atrial cavity is mildly dilated. Right atrial cavity is mildly dilated. 2. Moderate mitral regurgitation. Mild calcification of the mitral valve annulus. 3. Normal tricuspid valve with moderate regurgitation. Mild to moderate pulmonary hypertension with approx. PA syst. pressure of 40 mm of Hg.   Coronary angiogram 01/19/2014: Mid RCA 4x24 mm Rebel Non-DES stent. Mild disease left. EF 55%  Assessment:     ICD-10-CM   1. Chronic atrial fibrillation (HCC)  I48.20 EKG 12-Lead  2. Atherosclerosis of native coronary artery of native heart without angina pectoris  I25.10   3. Carotid stenosis, bilateral  I65.23   4. Abdominal aortic aneurysm (AAA) without rupture ([place])  I71.4     EKG 05/04/2019: Atrial fibrillation with controlled response at the rate of 92 bpm, normal axis, no evidence of ischemia. No significant change from EKG 02/19/2018  Recommendations:   Patient is here for office visit and follow-up for chronic A. fib and CAD.  He is presently doing well without any complaints.  Blood pressure is well controlled, on appropriate medical therapy.  A. fib remains rate controlled.  No bleeding diathesis reported.  He has not had any recent labs, will obtain CBC, CMP, and lipids for surveillance.  He was last evaluated by vascular surgery in January 2019, abdominal aortic aneurysm had actually decreased in size from 2.7 cm to 3.1 cm.  Carotid stenosis has been stable.  I had recommended that they contact their office for follow-up  as he is overdue for an appointment.  I will plan to see him back in 1 year or sooner if problems.  Miquel Dunn, MSN, APRN, FNP-C [facility]. [place] Office: [phone] Fax: [phone]

## 2019-12-12 ENCOUNTER — Inpatient Hospital Stay (HOSPITAL_COMMUNITY)
Admission: EM | Admit: 2019-12-12 | Discharge: 2019-12-14 | DRG: 309 | Disposition: A | Discharge status: Home / Self Care | Payer: Medicare Other | Attending: Family Medicine | Admitting: Family Medicine

## 2019-12-12 ENCOUNTER — Other Ambulatory Visit: Payer: Self-pay

## 2019-12-12 ENCOUNTER — Emergency Department (HOSPITAL_COMMUNITY): Payer: Medicare Other

## 2019-12-12 ENCOUNTER — Encounter (HOSPITAL_COMMUNITY): Payer: Self-pay | Admitting: Emergency Medicine

## 2019-12-12 DIAGNOSIS — E872 Acidosis: Secondary | ICD-10-CM | POA: Diagnosis not present

## 2019-12-12 DIAGNOSIS — R55 Syncope and collapse: Secondary | ICD-10-CM | POA: Diagnosis not present

## 2019-12-12 DIAGNOSIS — I482 Chronic atrial fibrillation, unspecified: Secondary | ICD-10-CM | POA: Diagnosis not present

## 2019-12-12 DIAGNOSIS — S99912A Unspecified injury of left ankle, initial encounter: Secondary | ICD-10-CM | POA: Diagnosis not present

## 2019-12-12 DIAGNOSIS — I4891 Unspecified atrial fibrillation: Secondary | ICD-10-CM | POA: Diagnosis not present

## 2019-12-12 DIAGNOSIS — G20 Parkinson's disease: Secondary | ICD-10-CM | POA: Diagnosis present

## 2019-12-12 DIAGNOSIS — E785 Hyperlipidemia, unspecified: Secondary | ICD-10-CM | POA: Diagnosis present

## 2019-12-12 DIAGNOSIS — W19XXXA Unspecified fall, initial encounter: Secondary | ICD-10-CM | POA: Diagnosis not present

## 2019-12-12 DIAGNOSIS — Z8249 Family history of ischemic heart disease and other diseases of the circulatory system: Secondary | ICD-10-CM

## 2019-12-12 DIAGNOSIS — Z8679 Personal history of other diseases of the circulatory system: Secondary | ICD-10-CM

## 2019-12-12 DIAGNOSIS — Y92009 Unspecified place in unspecified non-institutional (private) residence as the place of occurrence of the external cause: Secondary | ICD-10-CM | POA: Diagnosis not present

## 2019-12-12 DIAGNOSIS — G8929 Other chronic pain: Secondary | ICD-10-CM | POA: Diagnosis present

## 2019-12-12 DIAGNOSIS — I10 Essential (primary) hypertension: Secondary | ICD-10-CM | POA: Diagnosis present

## 2019-12-12 DIAGNOSIS — Z20822 Contact with and (suspected) exposure to covid-19: Secondary | ICD-10-CM | POA: Diagnosis not present

## 2019-12-12 DIAGNOSIS — Z87891 Personal history of nicotine dependence: Secondary | ICD-10-CM

## 2019-12-12 DIAGNOSIS — I69351 Hemiplegia and hemiparesis following cerebral infarction affecting right dominant side: Secondary | ICD-10-CM

## 2019-12-12 DIAGNOSIS — Z83438 Family history of other disorder of lipoprotein metabolism and other lipidemia: Secondary | ICD-10-CM

## 2019-12-12 DIAGNOSIS — S0990XA Unspecified injury of head, initial encounter: Secondary | ICD-10-CM | POA: Diagnosis not present

## 2019-12-12 DIAGNOSIS — I2510 Atherosclerotic heart disease of native coronary artery without angina pectoris: Secondary | ICD-10-CM | POA: Diagnosis present

## 2019-12-12 DIAGNOSIS — S299XXA Unspecified injury of thorax, initial encounter: Secondary | ICD-10-CM | POA: Diagnosis not present

## 2019-12-12 DIAGNOSIS — Z86010 Personal history of colonic polyps: Secondary | ICD-10-CM

## 2019-12-12 DIAGNOSIS — S93402A Sprain of unspecified ligament of left ankle, initial encounter: Secondary | ICD-10-CM | POA: Diagnosis present

## 2019-12-12 DIAGNOSIS — R05 Cough: Secondary | ICD-10-CM | POA: Diagnosis present

## 2019-12-12 DIAGNOSIS — G20A1 Parkinson's disease without dyskinesia, without mention of fluctuations: Secondary | ICD-10-CM | POA: Diagnosis present

## 2019-12-12 DIAGNOSIS — M549 Dorsalgia, unspecified: Secondary | ICD-10-CM | POA: Diagnosis present

## 2019-12-12 DIAGNOSIS — Z7901 Long term (current) use of anticoagulants: Secondary | ICD-10-CM

## 2019-12-12 DIAGNOSIS — W010XXA Fall on same level from slipping, tripping and stumbling without subsequent striking against object, initial encounter: Secondary | ICD-10-CM | POA: Diagnosis present

## 2019-12-12 DIAGNOSIS — F0280 Dementia in other diseases classified elsewhere without behavioral disturbance: Secondary | ICD-10-CM | POA: Diagnosis present

## 2019-12-12 DIAGNOSIS — Z79899 Other long term (current) drug therapy: Secondary | ICD-10-CM

## 2019-12-12 DIAGNOSIS — I714 Abdominal aortic aneurysm, without rupture: Secondary | ICD-10-CM | POA: Diagnosis present

## 2019-12-12 DIAGNOSIS — R42 Dizziness and giddiness: Secondary | ICD-10-CM | POA: Diagnosis present

## 2019-12-12 DIAGNOSIS — Z955 Presence of coronary angioplasty implant and graft: Secondary | ICD-10-CM

## 2019-12-12 DIAGNOSIS — F209 Schizophrenia, unspecified: Secondary | ICD-10-CM | POA: Diagnosis present

## 2019-12-12 LAB — CBC WITH DIFFERENTIAL/PLATELET
Abs Immature Granulocytes: 0.03 10*3/uL (ref 0.00–0.07)
Basophils Absolute: 0.1 10*3/uL (ref 0.0–0.1)
Basophils Relative: 1 %
Eosinophils Absolute: 0.1 10*3/uL (ref 0.0–0.5)
Eosinophils Relative: 1 %
HCT: 47.9 % (ref 39.0–52.0)
Hemoglobin: 15.1 g/dL (ref 13.0–17.0)
Immature Granulocytes: 0 %
Lymphocytes Relative: 32 %
Lymphs Abs: 2.6 10*3/uL (ref 0.7–4.0)
MCH: 30.8 pg (ref 26.0–34.0)
MCHC: 31.5 g/dL (ref 30.0–36.0)
MCV: 97.6 fL (ref 80.0–100.0)
Monocytes Absolute: 0.9 10*3/uL (ref 0.1–1.0)
Monocytes Relative: 10 %
Neutro Abs: 4.7 10*3/uL (ref 1.7–7.7)
Neutrophils Relative %: 56 %
Platelets: 388 10*3/uL (ref 150–400)
RBC: 4.91 MIL/uL (ref 4.22–5.81)
RDW: 13.2 % (ref 11.5–15.5)
WBC: 8.3 10*3/uL (ref 4.0–10.5)
nRBC: 0 % (ref 0.0–0.2)

## 2019-12-12 LAB — COMPREHENSIVE METABOLIC PANEL
ALT: 17 U/L (ref 0–44)
AST: 22 U/L (ref 15–41)
Albumin: 3.4 g/dL — ABNORMAL LOW (ref 3.5–5.0)
Alkaline Phosphatase: 52 U/L (ref 38–126)
Anion gap: 16 — ABNORMAL HIGH (ref 5–15)
BUN: 22 mg/dL (ref 8–23)
CO2: 19 mmol/L — ABNORMAL LOW (ref 22–32)
Calcium: 9.8 mg/dL (ref 8.9–10.3)
Chloride: 107 mmol/L (ref 98–111)
Creatinine, Ser: 1.37 mg/dL — ABNORMAL HIGH (ref 0.61–1.24)
GFR calc Af Amer: 58 mL/min — ABNORMAL LOW (ref >60)
GFR calc non Af Amer: 50 mL/min — ABNORMAL LOW (ref >60)
Glucose, Bld: 102 mg/dL — ABNORMAL HIGH (ref 70–99)
Potassium: 4.1 mmol/L (ref 3.5–5.1)
Sodium: 142 mmol/L (ref 135–145)
Total Bilirubin: 1.1 mg/dL (ref 0.3–1.2)
Total Protein: 7.1 g/dL (ref 6.5–8.1)

## 2019-12-12 LAB — MRSA PCR SCREENING: MRSA by PCR: NEGATIVE

## 2019-12-12 LAB — TROPONIN I (HIGH SENSITIVITY)
Troponin I (High Sensitivity): 8 ng/L (ref <18)
Troponin I (High Sensitivity): 7 ng/L (ref <18)

## 2019-12-12 LAB — LACTIC ACID, PLASMA
Lactic Acid, Venous: 3.1 mmol/L (ref 0.5–1.9)
Lactic Acid, Venous: 2.8 mmol/L (ref 0.5–1.9)

## 2019-12-12 LAB — SARS CORONAVIRUS 2 BY RT PCR (HOSPITAL ORDER, PERFORMED IN ~~LOC~~ HOSPITAL LAB): SARS Coronavirus 2: NEGATIVE

## 2019-12-12 LAB — BRAIN NATRIURETIC PEPTIDE: B Natriuretic Peptide: 233.1 pg/mL — ABNORMAL HIGH (ref 0.0–100.0)

## 2019-12-12 LAB — PROCALCITONIN: Procalcitonin: <0.1 ng/mL

## 2019-12-12 LAB — MAGNESIUM: Magnesium: 1.7 mg/dL (ref 1.7–2.4)

## 2019-12-12 MED ORDER — SIMVASTATIN 20 MG PO TABS
20.0000 mg | ORAL_TABLET | Freq: Every day | ORAL | Status: DC
  Administered 2019-12-12: 20 mg via ORAL
  Filled 2019-12-12: qty 1

## 2019-12-12 MED ORDER — APIXABAN 5 MG PO TABS
5.0000 mg | ORAL_TABLET | Freq: Two times a day (BID) | ORAL | Status: DC
  Administered 2019-12-12 – 2019-12-14 (×4): 5 mg via ORAL
  Filled 2019-12-12 (×5): qty 1

## 2019-12-12 MED ORDER — METOPROLOL SUCCINATE ER 100 MG PO TB24
200.0000 mg | ORAL_TABLET | Freq: Every day | ORAL | Status: DC
  Filled 2019-12-12: qty 8

## 2019-12-12 MED ORDER — SODIUM CHLORIDE 0.9 % IV BOLUS
1000.0000 mL | Freq: Once | INTRAVENOUS | Status: AC
  Administered 2019-12-12: 1000 mL via INTRAVENOUS

## 2019-12-12 MED ORDER — DILTIAZEM HCL-DEXTROSE 125-5 MG/125ML-% IV SOLN (PREMIX)
5.0000 mg/h | INTRAVENOUS | Status: DC
  Administered 2019-12-12: 10 mg/h via INTRAVENOUS
  Administered 2019-12-12: 5 mg/h via INTRAVENOUS
  Filled 2019-12-12 (×4): qty 125

## 2019-12-12 MED ORDER — HALOPERIDOL 1 MG PO TABS
2.5000 mg | ORAL_TABLET | Freq: Every day | ORAL | Status: DC
  Administered 2019-12-12 – 2019-12-13 (×2): 2.5 mg via ORAL
  Filled 2019-12-12 (×3): qty 1

## 2019-12-12 MED ORDER — ONDANSETRON HCL 4 MG/2ML IJ SOLN: 4.0000 mg | Freq: Four times a day (QID) | INTRAMUSCULAR | Status: DC | PRN

## 2019-12-12 MED ORDER — ONDANSETRON HCL 4 MG PO TABS: 4.0000 mg | ORAL_TABLET | Freq: Four times a day (QID) | ORAL | Status: DC | PRN

## 2019-12-12 MED ORDER — ACETAMINOPHEN 325 MG PO TABS: 650.0000 mg | ORAL_TABLET | Freq: Four times a day (QID) | ORAL | Status: DC | PRN

## 2019-12-12 MED ORDER — LISINOPRIL 20 MG PO TABS
20.0000 mg | ORAL_TABLET | Freq: Every day | ORAL | Status: DC
  Administered 2019-12-13: 20 mg via ORAL
  Filled 2019-12-12 (×2): qty 1

## 2019-12-12 MED ORDER — ISOSORBIDE MONONITRATE ER 60 MG PO TB24
60.0000 mg | ORAL_TABLET | Freq: Every day | ORAL | Status: DC
  Administered 2019-12-12 – 2019-12-13 (×2): 60 mg via ORAL
  Filled 2019-12-12 (×2): qty 1

## 2019-12-12 MED ORDER — GABAPENTIN 100 MG PO CAPS: 100.0000 mg | ORAL_CAPSULE | Freq: Three times a day (TID) | ORAL | Status: DC | PRN

## 2019-12-12 MED ORDER — HYDROCHLOROTHIAZIDE 25 MG PO TABS
25.0000 mg | ORAL_TABLET | Freq: Every day | ORAL | Status: DC
  Administered 2019-12-13: 25 mg via ORAL
  Filled 2019-12-12 (×2): qty 1

## 2019-12-12 MED ORDER — TRIHEXYPHENIDYL HCL 2 MG PO TABS
2.0000 mg | ORAL_TABLET | Freq: Every day | ORAL | Status: DC
  Administered 2019-12-12 – 2019-12-13 (×2): 2 mg via ORAL
  Filled 2019-12-12 (×3): qty 1

## 2019-12-12 MED ORDER — DOCUSATE SODIUM 100 MG PO CAPS: 100.0000 mg | ORAL_CAPSULE | Freq: Two times a day (BID) | ORAL | Status: DC | PRN

## 2019-12-12 MED ORDER — HYDRALAZINE HCL 50 MG PO TABS
50.0000 mg | ORAL_TABLET | Freq: Three times a day (TID) | ORAL | Status: DC
  Administered 2019-12-12 – 2019-12-14 (×5): 50 mg via ORAL
  Filled 2019-12-12: qty 2
  Filled 2019-12-12 (×3): qty 1
  Filled 2019-12-12: qty 2
  Filled 2019-12-12 (×2): qty 1

## 2019-12-12 MED ORDER — ASPIRIN EC 81 MG PO TBEC
81.0000 mg | DELAYED_RELEASE_TABLET | Freq: Every day | ORAL | Status: DC
  Administered 2019-12-13 – 2019-12-14 (×2): 81 mg via ORAL
  Filled 2019-12-12 (×2): qty 1

## 2019-12-12 MED ORDER — LIDOCAINE 5 % EX PTCH
1.0000 | MEDICATED_PATCH | CUTANEOUS | Status: DC
  Administered 2019-12-12 – 2019-12-13 (×2): 1 via TRANSDERMAL
  Filled 2019-12-12 (×2): qty 1

## 2019-12-12 NOTE — ED Notes (Signed)
Attempted straight cath. Met with resistance and pt was in severe pain. Tegeler, MD notified.

## 2019-12-12 NOTE — Progress Notes (Signed)
Orthopedic Tech Progress Note Patient Details:  Wesley [name] [birth year] [id number]  Ortho Devices Ortho Device/Splint Interventions: Application  air cast Post Interventions Patient Tolerated: Well Instructions Provided: Adjustment of device   [name] 12/12/2019, 2:41 PM

## 2019-12-12 NOTE — H&P (Addendum)
History and Physical    Wesley [name] [id number] DOB: [birth year] DOA: 12/12/2019  PCP: Nolene Ebbs, MD  Patient coming from: Home  I have personally briefly reviewed patient's old medical records in [place]  Chief Complaint: left ankle pain post fall.   HPI: Wesley [name] is a 74 y.o. male AA male with mild dementia HTN, dyslipidemia, Parkinson's, CAD status post PCI to [facility], PVD status post left CEA as well as AAA post aortic endograft placement who presented to ED for evaluation of left ankle pain post fall.  Limited history given patient's mild to moderate dementia however, he reports that was trying to switch off a light when he lost his balance and fell on his elbow. Also reports a fall 4 days ago while playing with his grandson missed a step on stairs and fell with some head trauma.   He lives with son and daughter-in-law Wesley [name] who endorses a fall 4 days ago.  Reports since then he has been unable to bear weight on the left ankle and has been mostly staying in bed.  He reports dizziness described as lightheadedness.  Unable to report any alleviating or aggravating factors. He reports cough productive of grayish phlegm however denies any dyspnea, orthopnea or PND.  He denies any  CP, palpitations.  He denies any weakness numbness.  No loss of consciousness. No fever or chills.  No blood in the stools.  No diarrhea. No chest pain or abdominal pain.   Personal social history: Lives with son and daughter-in-law.  Former smoker 0.5 pack/day for 40 years.  Quit 20 years ago.  Denies any EtOH or illicit drug use.  Used to work in [employer].  Occasionally uses a cane to ambulate.  Family history: Patient denies.  ED Course:  Vital signs notable for 156/105, pulse 117-138, RR 13-21, SPO2 100% room air.  EKG with A. fib with ventricular response of 147 bpm Head CT negative CXR negative Left ankle and foot x-ray unremarkable.  Labs with WBC 8.3, Hb  15, platelets 388. Sodium 142, potassium 4.1, creatinine 1.37, albumin 3.4 Lactic acid 3.1 Troponin I 8, BNP pending Covid negative  ED thinks falls potentially from A. fib with RVR.  Requesting admission for monitoring.  Patient started on Cardizem drip in the ED.  Review of Systems  Constitutional: Negative for chills, diaphoresis, fever, malaise/fatigue and weight loss.  HENT: Negative.   Eyes: Negative.   Respiratory: Positive for cough and sputum production. Negative for hemoptysis, shortness of breath and wheezing.   Cardiovascular: Negative for chest pain, palpitations, orthopnea, claudication, leg swelling and PND.  Gastrointestinal: Negative.   Genitourinary: Negative.   Musculoskeletal: Negative.   Skin: Negative.   Neurological: Positive for dizziness and weakness. Negative for tingling, tremors, sensory change, speech change, focal weakness, seizures, loss of consciousness and headaches.  Endo/Heme/Allergies: Negative.   Psychiatric/Behavioral: Negative.     Past Medical History:  Diagnosis Date  . AAA (abdominal aortic aneurysm) ([place])   . Arthritis    "legs" (01/19/2014)  . Back pain    reason unknown  . Carotid artery occlusion   . Chronic back pain    "neck to tailbone" (01/19/2014)  . Coronary artery disease   . Dysphagia   . Dysrhythmia    Atrial Fib-takes Atenolol daily  . History of blood transfusion   . History of bronchitis 59yrs ago  . History of colon polyps   . Hyperlipidemia    takes Zocor daily  . Hypertension  takes Clonidine,Apresoline,Imdur,and Lisinopril daily  . Schizophrenia ([place])    takes Haldol daily  . Seizures ([place])    has had one several yrs ago but never been on meds  . Stroke [facility]) 04/16/1986   takes Plavix daily as well as Savaysa;right sided weakness    Past Surgical History:  Procedure Laterality Date  . ABDOMINAL AORTIC ENDOVASCULAR STENT GRAFT N/A 02/01/2014   Procedure: ABDOMINAL AORTIC ENDOVASCULAR STENT GRAFT;   Surgeon: Elam Dutch, MD;  Location: [facility];  Service: Vascular;  Laterality: N/A;  . CARPAL TUNNEL RELEASE Right   . COLONOSCOPY    . CORONARY ANGIOPLASTY WITH STENT PLACEMENT  01/19/2014   "1"  . ENDARTERECTOMY Left 03/16/2014   Procedure: LEFT CAROTID ENDARTERECTOMY WITH PATCH ANGIOPLASTY ;  Surgeon: Elam Dutch, MD;  Location: [place];  Service: Vascular;  Laterality: Left;  . EYE SURGERY Bilateral    cataract surgery  . LEFT HEART CATHETERIZATION WITH CORONARY ANGIOGRAM N/A 01/19/2014   Procedure: LEFT HEART CATHETERIZATION WITH CORONARY ANGIOGRAM;  Surgeon: Laverda Page, MD;  Location: [facility] CATH LAB;  Service: Cardiovascular;  Laterality: N/A;  . PERCUTANEOUS CORONARY STENT INTERVENTION (PCI-S)  01/19/2014   Procedure: PERCUTANEOUS CORONARY STENT INTERVENTION (PCI-S);  Surgeon: Laverda Page, MD;  Location: [facility] C[facility] LAB;  Service: Cardiovascular;;     reports that he has quit smoking. His smoking use included cigarettes. He has a 58.00 pack-year smoking history. He has never used smokeless tobacco. He reports that he does not drink alcohol or use drugs.  No Known Allergies  Family History  Problem Relation Age of Onset  . Cancer Father   . Hyperlipidemia Father   . Hypertension Father   . Heart disease Sister   . AAA (abdominal aortic aneurysm) Sister      Prior to Admission medications   Medication Sig Start Date End Date Taking? Authorizing Provider  apixaban (ELIQUIS) 5 MG TABS tablet Take 1 tablet (5 mg total) by mouth 2 (two) times daily. 05/04/19   Miquel Dunn, NP  haloperidol (HALDOL) 5 MG tablet Take 2.5 mg by mouth at bedtime.  08/30/13   [provider]  hydrALAZINE (APRESOLINE) 50 MG tablet Take 1 tablet (50 mg total) by mouth 3 (three) times daily. 05/04/19   Miquel Dunn, NP  isosorbide mononitrate (IMDUR) 60 MG 24 hr tablet Take 1 tablet (60 mg total) by mouth at bedtime. 05/04/19   Miquel Dunn, NP    lisinopril-hydrochlorothiazide (ZESTORETIC) 20-25 MG tablet Take 1 tablet by mouth daily. PLEASE CALL OFFICE FOR FURTHER REFILLS 05/04/19   Miquel Dunn, NP  metoprolol (TOPROL-XL) 200 MG 24 hr tablet Take 1 tablet (200 mg total) by mouth daily. 05/04/19   Miquel Dunn, NP  simvastatin (ZOCOR) 20 MG tablet TAKE 1 TABLET BY MOUTH EVERYDAY AT BEDTIME 05/04/19   Miquel Dunn, NP  trihexyphenidyl (ARTANE) 2 MG tablet Take 2 mg by mouth at bedtime.  08/25/13   [provider]    Physical Exam: Vitals:   12/12/19 1245 12/12/19 1300 12/12/19 1315 12/12/19 1330  BP: (!) 150/105 (!) 140/106 (!) 156/124 (!) 156/106  Pulse:   (!) 117 (!) 138  Resp: 13 13 (!) 21 (!) 0  Temp:      TempSrc:      SpO2:   100% 98%  Height:        Constitutional: NAD, calm, comfortable Vitals:   12/12/19 1245 12/12/19 1300 12/12/19 1315 12/12/19 1330  BP: Marland Kitchen)  150/105 (!) 140/106 (!) 156/124 (!) 156/106  Pulse:   (!) 117 (!) 138  Resp: 13 13 (!) 21 (!) 0  Temp:      TempSrc:      SpO2:   100% 98%  Height:       Eyes: PERRL, lids and conjunctivae normal ENMT: Mucous membranes are moist. Posterior pharynx clear of any exudate or lesions.Normal dentition.  Neck: normal, supple, no masses, no thyromegaly Respiratory: clear to auscultation bilaterally, no wheezing, no crackles. Normal respiratory effort. No accessory muscle use.  Cardiovascular: Regular rate and rhythm, no murmurs / rubs / gallops. No extremity edema. 2+ pedal pulses. No carotid bruits.  Abdomen: no tenderness, no masses palpated. No hepatosplenomegaly. Bowel sounds positive.  Musculoskeletal: no clubbing / cyanosis. No joint deformity upper and lower extremities. Good ROM, no contractures. Normal muscle tone.  Skin: no rashes, lesions, ulcers. No induration Neurologic: CN 2-12 grossly intact. Sensation intact, DTR normal. Strength 5/5 in all 4.  Psychiatric: Normal judgment and insight. Alert and oriented x 3. Normal  mood.   Physical Exam  Constitutional: He is oriented to person, place, and time. He appears well-developed. No distress.  Frail appearing male in no apparent distress.  HENT:  Head: Normocephalic and atraumatic.  Right Ear: External ear normal.  Left Ear: External ear normal.  Nose: Nose normal.  Mouth/Throat: No oropharyngeal exudate.  Eyes: Pupils are equal, round, and reactive to light. EOM are normal. Right eye exhibits no discharge. Left eye exhibits no discharge.  Neck: No JVD present.  No JVP.  Cardiovascular: Normal rate. Exam reveals no gallop and no friction rub.  No murmur heard. Pulmonary/Chest: Breath sounds normal. No respiratory distress. He has no wheezes. He has no rales. He exhibits no tenderness.  Abdominal: Soft. Bowel sounds are normal. He exhibits no distension and no mass. There is no abdominal tenderness. There is no rebound and no guarding.  Musculoskeletal:        General: No deformity or edema. Normal range of motion.     Cervical back: Normal range of motion and neck supple.     Comments: Extreme tenderness of the left ankle that does not appear to be significantly swollen.  Neurological: He is alert and oriented to person, place, and time. No cranial nerve deficit.  Resting tremor of the right upper extremity.  Skin: Skin is warm. No rash noted. He is not diaphoretic. No erythema. No pallor.  Vitals reviewed.    Labs on Admission: I have personally reviewed following labs and imaging studies  CBC: Recent Labs  Lab 12/12/19 1047  WBC 8.3  NEUTROABS 4.7  HGB 15.1  HCT 47.9  MCV 97.6  PLT 123456   Basic Metabolic Panel: Recent Labs  Lab 12/12/19 1047  NA 142  K 4.1  CL 107  CO2 19*  GLUCOSE 102*  BUN 22  CREATININE 1.37*  CALCIUM 9.8  MG 1.7   GFR: CrCl cannot be calculated (Unknown ideal weight.). Liver Function Tests: Recent Labs  Lab 12/12/19 1047  AST 22  ALT 17  ALKPHOS 52  BILITOT 1.1  PROT 7.1  ALBUMIN 3.4*   No  results for input(s): LIPASE, AMYLASE in the last 168 hours. No results for input(s): AMMONIA in the last 168 hours. Coagulation Profile: No results for input(s): INR, PROTIME in the last 168 hours. Cardiac Enzymes: No results for input(s): CKTOTAL, CKMB, CKMBINDEX, TROPONINI in the last 168 hours. BNP (last 3 results) No results for input(s): PROBNP in  the last 8760 hours. HbA1C: No results for input(s): HGBA1C in the last 72 hours. CBG: No results for input(s): GLUCAP in the last 168 hours. Lipid Profile: No results for input(s): CHOL, HDL, LDLCALC, TRIG, CHOLHDL, LDLDIRECT in the last 72 hours. Thyroid Function Tests: No results for input(s): TSH, T4TOTAL, FREET4, T3FREE, THYROIDAB in the last 72 hours. Anemia Panel: No results for input(s): VITAMINB12, FOLATE, FERRITIN, TIBC, IRON, RETICCTPCT in the last 72 hours. Urine analysis:    Component Value Date/Time   COLORURINE YELLOW 02/05/2015 1643   APPEARANCEUR CLEAR 02/05/2015 1643   LABSPEC 1.018 02/05/2015 1643   PHURINE 5.0 02/05/2015 1643   GLUCOSEU NEGATIVE 02/05/2015 1643   HGBUR NEGATIVE 02/05/2015 1643   BILIRUBINUR NEGATIVE 02/05/2015 1643   KETONESUR NEGATIVE 02/05/2015 1643   PROTEINUR NEGATIVE 02/05/2015 1643   UROBILINOGEN 0.2 02/05/2015 1643   NITRITE NEGATIVE 02/05/2015 1643   LEUKOCYTESUR NEGATIVE 02/05/2015 1643    Radiological Exams on Admission: DG Chest 2 View  Result Date: 12/12/2019 CLINICAL DATA:  Fall, right upper lateral back pain EXAM: CHEST - 2 VIEW COMPARISON:  02/05/2015 FINDINGS: Normal heart size. Lungs remain clear. Negative for pneumonia, collapse or consolidation. No edema, effusion, or pneumothorax. Trachea midline. Aorta atherosclerotic. Degenerative changes of the spine. No focal kyphosis. IMPRESSION: Stable chest exam.  No active disease. Aortic Atherosclerosis (ICD10-I70.0). Electronically Signed   By: Jerilynn Mages.  Shick M.D.   On: 12/12/2019 11:13   DG Ankle Complete Left  Result Date:  12/12/2019 CLINICAL DATA:  Fall, lateral ankle pain. EXAM: LEFT ANKLE COMPLETE - 3+ VIEW COMPARISON:  None. FINDINGS: There is no evidence of acute fracture, dislocation, or joint effusion. There is no evidence of arthropathy or other focal bone abnormality. Soft tissues are unremarkable. IMPRESSION: Negative. Electronically Signed   By: Franki Cabot M.D.   On: 12/12/2019 08:44   CT Head Wo Contrast  Result Date: 12/12/2019 CLINICAL DATA:  Head trauma, falls, on anticoagulation EXAM: CT HEAD WITHOUT CONTRAST TECHNIQUE: Contiguous axial images were obtained from the base of the skull through the vertex without intravenous contrast. COMPARISON:  02/05/2015 FINDINGS: Brain: Diffuse brain atrophy and chronic white matter microvascular changes. Slight progression compared 2016. No acute intracranial hemorrhage mass lesion, definite infarction, midline shift, herniation hydrocephalus, or extra-axial fluid collection. No focal mass effect or edema. Cisterns are patent. Cerebellar atrophy as well. Vascular: Intracranial atherosclerosis.  No hyperdense vessel. Skull: Normal. Negative for fracture or focal lesion. Sinuses/Orbits: No acute finding. Other: None. IMPRESSION: Progressive atrophy pattern without acute intracranial abnormality by noncontrast CT. Electronically Signed   By: Jerilynn Mages.  Shick M.D.   On: 12/12/2019 11:18   DG Foot Complete Left  Result Date: 12/12/2019 CLINICAL DATA:  Fall, ankle pain. EXAM: LEFT FOOT - COMPLETE 3+ VIEW COMPARISON:  None. FINDINGS: There is no evidence of fracture or dislocation. There is no evidence of arthropathy or other focal bone abnormality. Soft tissues are unremarkable. IMPRESSION: Negative. Electronically Signed   By: Franki Cabot M.D.   On: 12/12/2019 08:43    EKG: Independently reviewed.  A. fib with ventricular rate of 147 bpm, normal axis, no ST-T wave changes.  Intervals within normal limits.  Assessment/Plan Active Problems:   A-fib ([place])   Fall at home,  initial encounter   Parkinson disease ([place])   #Afib with RVR -Continue with Eliquis -Continue with Toprol.  Last reported EF per Dr. Einar Gip was normal.  Therefore agree with adding Cardizem and transitioning to p.o. as tolerated.  Dr. Einar Gip is not in  favor of adding amiodarone given chronic A. fib.  DC cardioversion also not an option due to similar reasons. Dr. Einar Gip thinks A. fib likely secondary to some other etiology. Thanks A. fib with RVR likely secondary to some other problem.  Recommends observation. -TTE in a.m. -Can be called at [id number] if formal consult required.  #Lactic acidosis 3.1--2.1 -Unclear etiology.  Question dehydration. -We will consider adding procalcitonin/BNP to assess fluid status/rule out sepsis.  #Fall/left ankle pain. -Appears mechanical.  No evidence of CVA. -There is a question if A. fib with RVR is contributing to dizziness/falls. -Also suspect contribution from frailty/posture/deconditioning. -Air brace for left ankle.  -Tylenol/gabapentin/lidocaine for pain control.  Avoiding narcotics.  Avoiding NSAIDs due to CKD. -PT OT evaluation in am.  Consider further imaging such as CT/orthopedic consult if no improvement in left ankle pain.   #CAD s/p PCI to [facility]  -Patient currently not on Plavix due to Eliquis. -Continue with aspirin and Zocor.  #Parkinson disease -Continue with Artane.  #Schizophrenia  #Observation status contingent on improvement of A. fib.  DVT prophylaxis: Patient on Eliquis.    Code Status: Full code Family Communication: Spoke to daughter-in-law Wesley [name].   Disposition Plan: Rehab versus home PT.    Consults called: None Admission status: Observation.   Granville Whitefield MD [facility]   If 7PM-7AM, please contact night-coverage [email or web address] Password [facility]  12/12/2019, 1:49 PM

## 2019-12-12 NOTE — ED Notes (Signed)
Pt transported to X-ray, to go to CT after

## 2019-12-12 NOTE — ED Provider Notes (Signed)
[facility] EMERGENCY DEPARTMENT Provider Note   CSN: [id number] Arrival date & time: 12/12/19  [id number]     History Chief Complaint  Patient presents with  . Foot Pain  . Ankle Pain  . Fall    Wesley [name] is a 74 y.o. male.  The history is provided by the patient and medical records. No language interpreter was used.  Near Syncope This is a recurrent problem. The current episode started more than 2 days ago. The problem occurs constantly. The problem has not changed since onset.Pertinent negatives include no chest pain, no abdominal pain, no headaches and no shortness of breath. Nothing aggravates the symptoms. Nothing relieves the symptoms. He has tried nothing for the symptoms. The treatment provided no relief.       Past Medical History:  Diagnosis Date  . AAA (abdominal aortic aneurysm) ([place])   . Arthritis    "legs" (01/19/2014)  . Back pain    reason unknown  . Carotid artery occlusion   . Chronic back pain    "neck to tailbone" (01/19/2014)  . Coronary artery disease   . Dysphagia   . Dysrhythmia    Atrial Fib-takes Atenolol daily  . History of blood transfusion   . History of bronchitis 30yrs ago  . History of colon polyps   . Hyperlipidemia    takes Zocor daily  . Hypertension    takes Clonidine,Apresoline,Imdur,and Lisinopril daily  . Schizophrenia ([place])    takes Haldol daily  . Seizures ([place])    has had one several yrs ago but never been on meds  . Stroke [facility]) 04/16/1986   takes Plavix daily as well as Savaysa;right sided weakness    Patient Active Problem List   Diagnosis Date Noted  . Atherosclerosis of native coronary artery of native heart with angina pectoris ([place]) 03/12/2019  . Essential hypertension 03/12/2019  . History of repair of aneurysm of abdominal aorta using endovascular stent graft 03/12/2019  . Hyperlipidemia, group A 03/12/2019  . Chronic atrial fibrillation ([place]) 02/06/2015  . AKI (acute kidney injury) ([place])  02/05/2015  . Syncope 02/05/2015  . Bilateral carotid artery stenosis 03/16/2014  . AAA (abdominal aortic aneurysm) ([place]) 02/01/2014  . S/P PTCA (percutaneous transluminal coronary angioplasty) 01/19/2014  . AAA (abdominal aortic aneurysm) without rupture ([place]) 12/31/2013  . Occlusion and stenosis of carotid artery without mention of cerebral infarction 12/31/2013    Past Surgical History:  Procedure Laterality Date  . ABDOMINAL AORTIC ENDOVASCULAR STENT GRAFT N/A 02/01/2014   Procedure: ABDOMINAL AORTIC ENDOVASCULAR STENT GRAFT;  Surgeon: Elam Dutch, MD;  Location: [place];  Service: Vascular;  Laterality: N/A;  . CARPAL TUNNEL RELEASE Right   . COLONOSCOPY    . CORONARY ANGIOPLASTY WITH STENT PLACEMENT  01/19/2014   "1"  . ENDARTERECTOMY Left 03/16/2014   Procedure: LEFT CAROTID ENDARTERECTOMY WITH PATCH ANGIOPLASTY ;  Surgeon: Elam Dutch, MD;  Location: [place];  Service: Vascular;  Laterality: Left;  . EYE SURGERY Bilateral    cataract surgery  . LEFT HEART CATHETERIZATION WITH CORONARY ANGIOGRAM N/A 01/19/2014   Procedure: LEFT HEART CATHETERIZATION WITH CORONARY ANGIOGRAM;  Surgeon: Laverda Page, MD;  Location: [facility] CATH LAB;  Service: Cardiovascular;  Laterality: N/A;  . PERCUTANEOUS CORONARY STENT INTERVENTION (PCI-S)  01/19/2014   Procedure: PERCUTANEOUS CORONARY STENT INTERVENTION (PCI-S);  Surgeon: Laverda Page, MD;  Location: [facility] CATH LAB;  Service: Cardiovascular;;       Family History  Problem Relation Age of Onset  .  Cancer Father   . Hyperlipidemia Father   . Hypertension Father   . Heart disease Sister   . AAA (abdominal aortic aneurysm) Sister     Social History   Tobacco Use  . Smoking status: Former Smoker    Packs/day: 1.00    Years: 58.00    Pack years: 58.00    Types: Cigarettes  . Smokeless tobacco: Never Used  . Tobacco comment: quit smoking a couple of yrs ago  Substance Use Topics  . Alcohol use: No    Alcohol/week: 0.0 standard  drinks  . Drug use: No    Home Medications Prior to Admission medications   Medication Sig Start Date End Date Taking? Authorizing Provider  apixaban (ELIQUIS) 5 MG TABS tablet Take 1 tablet (5 mg total) by mouth 2 (two) times daily. 05/04/19   Miquel Dunn, NP  haloperidol (HALDOL) 5 MG tablet Take 2.5 mg by mouth at bedtime.  08/30/13   [provider]  hydrALAZINE (APRESOLINE) 50 MG tablet Take 1 tablet (50 mg total) by mouth 3 (three) times daily. 05/04/19   Miquel Dunn, NP  isosorbide mononitrate (IMDUR) 60 MG 24 hr tablet Take 1 tablet (60 mg total) by mouth at bedtime. 05/04/19   Miquel Dunn, NP  lisinopril-hydrochlorothiazide (ZESTORETIC) 20-25 MG tablet Take 1 tablet by mouth daily. PLEASE CALL OFFICE FOR FURTHER REFILLS 05/04/19   Miquel Dunn, NP  metoprolol (TOPROL-XL) 200 MG 24 hr tablet Take 1 tablet (200 mg total) by mouth daily. 05/04/19   Miquel Dunn, NP  simvastatin (ZOCOR) 20 MG tablet TAKE 1 TABLET BY MOUTH EVERYDAY AT BEDTIME 05/04/19   Miquel Dunn, NP  trihexyphenidyl (ARTANE) 2 MG tablet Take 2 mg by mouth at bedtime.  08/25/13   [provider]    Allergies    Patient has no known allergies.  Review of Systems   Review of Systems  Constitutional: Positive for fatigue. Negative for chills, diaphoresis and fever.  HENT: Negative for congestion and dental problem.   Respiratory: Negative for cough, chest tightness, shortness of breath and wheezing.   Cardiovascular: Positive for palpitations and near-syncope. Negative for chest pain and leg swelling.  Gastrointestinal: Negative for abdominal pain, constipation, diarrhea, nausea and vomiting.  Genitourinary: Negative for dysuria, flank pain and frequency.  Musculoskeletal: Negative for back pain, neck pain and neck stiffness.  Skin: Negative for rash and wound.  Neurological: Positive for light-headedness. Negative for dizziness, syncope, speech  difficulty, weakness, numbness and headaches.  Psychiatric/Behavioral: Negative for agitation and confusion.  All other systems reviewed and are negative.   Physical Exam Updated Vital Signs BP 138/77 (BP Location: Right Arm)   Pulse 95   Temp 98.2 F (36.8 C) (Oral)   Resp (!) 31   Ht 6\' 1"  (1.854 m)   SpO2 100%   BMI 23.18 kg/m   Physical Exam Vitals and nursing note reviewed.  Constitutional:      General: He is not in acute distress.    Appearance: He is well-developed. He is not ill-appearing, toxic-appearing or diaphoretic.  HENT:     Head: Normocephalic and atraumatic.     Nose: No congestion or rhinorrhea.     Mouth/Throat:     Mouth: Mucous membranes are moist.     Pharynx: No oropharyngeal exudate or posterior oropharyngeal erythema.  Eyes:     Extraocular Movements: Extraocular movements intact.     Conjunctiva/sclera: Conjunctivae normal.     Pupils: Pupils are equal,  round, and reactive to light.  Cardiovascular:     Rate and Rhythm: Tachycardia present. Rhythm irregular.     Pulses: Normal pulses.     Heart sounds: Murmur present.  Pulmonary:     Effort: Pulmonary effort is normal. No respiratory distress.     Breath sounds: Normal breath sounds. No wheezing, rhonchi or rales.  Chest:     Chest wall: No tenderness.  Abdominal:     General: Abdomen is flat.     Palpations: Abdomen is soft.     Tenderness: There is no abdominal tenderness. There is no right CVA tenderness, left CVA tenderness, guarding or rebound.  Musculoskeletal:        General: Tenderness and signs of injury present. No deformity.     Cervical back: Neck supple.     Right lower leg: No edema.     Left lower leg: No edema.  Skin:    General: Skin is warm and dry.     Capillary Refill: Capillary refill takes less than 2 seconds.     Findings: No erythema or rash.  Neurological:     General: No focal deficit present.     Mental Status: He is alert.     Sensory: No sensory deficit.       Motor: No weakness.  Psychiatric:        Mood and Affect: Mood normal.     ED Results / Procedures / Treatments   Labs (all labs ordered are listed, but only abnormal results are displayed) Labs Reviewed  COMPREHENSIVE METABOLIC PANEL - Abnormal; Notable for the following components:      Result Value   CO2 19 (*)    Glucose, Bld 102 (*)    Creatinine, Ser 1.37 (*)    Albumin 3.4 (*)    GFR calc non Af Amer 50 (*)    GFR calc Af Amer 58 (*)    Anion gap 16 (*)    All other components within normal limits  LACTIC ACID, PLASMA - Abnormal; Notable for the following components:   Lactic Acid, Venous 3.1 (*)    All other components within normal limits  LACTIC ACID, PLASMA - Abnormal; Notable for the following components:   Lactic Acid, Venous 2.8 (*)    All other components within normal limits  SARS CORONAVIRUS 2 BY RT PCR (HOSPITAL ORDER, [place] [facility])  URINE CULTURE  CBC WITH DIFFERENTIAL/PLATELET  MAGNESIUM  URINALYSIS, ROUTINE W REFLEX MICROSCOPIC  BRAIN NATRIURETIC PEPTIDE  TROPONIN I (HIGH SENSITIVITY)  TROPONIN I (HIGH SENSITIVITY)    EKG EKG Interpretation  Date/Time:  Saturday Dec 12 2019 09:30:47 EDT Ventricular Rate:  147 PR Interval:    QRS Duration: 92 QT Interval:  303 QTC Calculation: 474 R Axis:   66 Text Interpretation: Atrial fibrillation with rapid V-rate When compared to prior, now afib with RVR No STEMI Confirmed by Antony Blackbird [phone]) on 12/12/2019 10:03:43 AM   Radiology DG Chest 2 View  Result Date: 12/12/2019 CLINICAL DATA:  Fall, right upper lateral back pain EXAM: CHEST - 2 VIEW COMPARISON:  02/05/2015 FINDINGS: Normal heart size. Lungs remain clear. Negative for pneumonia, collapse or consolidation. No edema, effusion, or pneumothorax. Trachea midline. Aorta atherosclerotic. Degenerative changes of the spine. No focal kyphosis. IMPRESSION: Stable chest exam.  No active disease. Aortic Atherosclerosis  (ICD10-I70.0). Electronically Signed   By: Jerilynn Mages.  Shick M.D.   On: 12/12/2019 11:13   DG Ankle Complete Left  Result Date: 12/12/2019  CLINICAL DATA:  Fall, lateral ankle pain. EXAM: LEFT ANKLE COMPLETE - 3+ VIEW COMPARISON:  None. FINDINGS: There is no evidence of acute fracture, dislocation, or joint effusion. There is no evidence of arthropathy or other focal bone abnormality. Soft tissues are unremarkable. IMPRESSION: Negative. Electronically Signed   By: Franki Cabot M.D.   On: 12/12/2019 08:44   CT Head Wo Contrast  Result Date: 12/12/2019 CLINICAL DATA:  Head trauma, falls, on anticoagulation EXAM: CT HEAD WITHOUT CONTRAST TECHNIQUE: Contiguous axial images were obtained from the base of the skull through the vertex without intravenous contrast. COMPARISON:  02/05/2015 FINDINGS: Brain: Diffuse brain atrophy and chronic white matter microvascular changes. Slight progression compared 2016. No acute intracranial hemorrhage mass lesion, definite infarction, midline shift, herniation hydrocephalus, or extra-axial fluid collection. No focal mass effect or edema. Cisterns are patent. Cerebellar atrophy as well. Vascular: Intracranial atherosclerosis.  No hyperdense vessel. Skull: Normal. Negative for fracture or focal lesion. Sinuses/Orbits: No acute finding. Other: None. IMPRESSION: Progressive atrophy pattern without acute intracranial abnormality by noncontrast CT. Electronically Signed   By: Jerilynn Mages.  Shick M.D.   On: 12/12/2019 11:18   DG Foot Complete Left  Result Date: 12/12/2019 CLINICAL DATA:  Fall, ankle pain. EXAM: LEFT FOOT - COMPLETE 3+ VIEW COMPARISON:  None. FINDINGS: There is no evidence of fracture or dislocation. There is no evidence of arthropathy or other focal bone abnormality. Soft tissues are unremarkable. IMPRESSION: Negative. Electronically Signed   By: Franki Cabot M.D.   On: 12/12/2019 08:43    Procedures Procedures (including critical care time)  CRITICAL CARE Performed by:  Gwenyth Allegra Avrom [place]arts Total critical care time: 35 minutes Critical care time was exclusive of separately billable procedures and treating other patients. Critical care was necessary to treat or prevent imminent or life-threatening deterioration. Critical care was time spent personally by me on the following activities: development of treatment plan with patient and/or surrogate as well as nursing, discussions with consultants, evaluation of patient's response to treatment, examination of patient, obtaining history from patient or surrogate, ordering and performing treatments and interventions, ordering and review of laboratory studies, ordering and review of radiographic studies, pulse oximetry and re-evaluation of patient's condition.   Medications Ordered in ED Medications  diltiazem (CARDIZEM) 125 mg in dextrose 5% 125 mL (1 mg/mL) infusion (12.5 mg/hr Intravenous Rate/Dose Change 12/12/19 1613)  docusate sodium (COLACE) capsule 100 mg (has no administration in time range)  ondansetron (ZOFRAN) tablet 4 mg (has no administration in time range)    Or  ondansetron (ZOFRAN) injection 4 mg (has no administration in time range)  lidocaine (LIDODERM) 5 % 1 patch (has no administration in time range)  gabapentin (NEURONTIN) capsule 100 mg (has no administration in time range)  acetaminophen (TYLENOL) tablet 650 mg (has no administration in time range)  hydrALAZINE (APRESOLINE) tablet 50 mg (has no administration in time range)  isosorbide mononitrate (IMDUR) 24 hr tablet 60 mg (has no administration in time range)  metoprolol succinate (TOPROL-XL) 24 hr tablet 200 mg (has no administration in time range)  simvastatin (ZOCOR) tablet 20 mg (has no administration in time range)  haloperidol (HALDOL) tablet 2.5 mg (has no administration in time range)  apixaban (ELIQUIS) tablet 5 mg (has no administration in time range)  trihexyphenidyl (ARTANE) tablet 2 mg (has no administration in time range)    lisinopril (ZESTRIL) tablet 20 mg (has no administration in time range)  hydrochlorothiazide (HYDRODIURIL) tablet 25 mg (has no administration in time range)  sodium chloride  0.9 % bolus 1,000 mL (1,000 mLs Intravenous New Bag/Given 12/12/19 1034)    ED Course  I have reviewed the triage vital signs and the nursing notes.  Pertinent labs & imaging results that were available during my care of the patient were reviewed by me and considered in my medical decision making (see chart for details).    MDM Rules/Calculators/A&P                      Arin Tapscott is a 74 y.o. male with a past medical history significant for AAA, chronic atrial fibrillation on Eliquis therapy, CAD status post PCI, and hypertension who presents with left ankle injury due to multiple falls and lightheaded near syncopal episodes.  Patient reports that over the last several days, he has had 4 episodes of falling to the ground due to severe lightheadedness and near syncope.  He reports he is never actually passed out but he has hit his head several times.  He reports this last when he twisted his left ankle and had severe pain prompting him to come get evaluation for the ankle pain.  His chief complaint upon arrival to the emergency department was left ankle pain however, on evaluation, patient was found to have heart rate in the 150s with A. fib/RVR.  Patient reports he has felt slightly dehydrated and has not had any chest pain or palpitations.  He does report some chronic shortness of breath which is at his baseline.  He does report he keeps falling due to these lightheaded episodes.  He thinks it is related to his A. fib going faster.  He reports no dysuria or hematuria.  He denies constipation or diarrhea.  Denies nausea or vomiting at this time.  He reports no significant headache but he did report hitting his head several times during his falls.  No neck pain or neck stiffness.  On exam, patient does have tenderness in  his left ankle but he has normal sensation, pulse, and strength in the foot.  No other tenderness in the legs.  No hip tenderness.  Abdomen and chest nontender.  Lungs were clear.  Slight murmur.  Good pulses in extremities.  No focal neurologic deficits on my initial exam.  Patient is tachycardic with irregular rhythm with A. fib on the monitor with rates between 140 and 150 initially.  Decision was made to start the patient on diltiazem to help slow his rate down will give him fluids.  He will have work-up to look for etiology of his A. fib/RVR with urinalysis and chest x-ray.  Patient was able to urinate for so we will give some fluids.  A catheter was attempted and he still was unable to urinate.  Chest x-ray did not show pneumonia.  X-ray of the ankle and foot were reassuring, suspect sprain.  Patient will be given an Aircast/immobilizer however I do feel that he will need admission for the A. fib with RVR leading to his multiple falls under syncopal episodes.  Patient agrees.  Patient will be admitted for further management.   Final Clinical Impression(s) / ED Diagnoses Final diagnoses:  Atrial fibrillation with RVR ([place])  Fall, initial encounter  Near syncope  Sprain of left ankle, unspecified ligament, initial encounter     Clinical Impression: 1. Atrial fibrillation with RVR ([place])   2. Fall, initial encounter   3. Near syncope   4. Sprain of left ankle, unspecified ligament, initial encounter     Disposition: Admit  This note was prepared with assistance of Systems analyst. Occasional wrong-word or sound-a-like substitutions may have occurred due to the inherent limitations of voice recognition software.     Ibraheem Voris, Gwenyth Allegra, MD 12/12/19 [phone]

## 2019-12-12 NOTE — ED Triage Notes (Signed)
Pt. STATED, I FELL OR TRIPPED 3-4 DAYS AGO AND HURT MY LEFT FOOT AND ANKLE

## 2019-12-13 ENCOUNTER — Observation Stay (HOSPITAL_COMMUNITY): Payer: Medicare Other

## 2019-12-13 DIAGNOSIS — I482 Chronic atrial fibrillation, unspecified: Secondary | ICD-10-CM | POA: Diagnosis not present

## 2019-12-13 DIAGNOSIS — Z8679 Personal history of other diseases of the circulatory system: Secondary | ICD-10-CM | POA: Diagnosis not present

## 2019-12-13 DIAGNOSIS — I69351 Hemiplegia and hemiparesis following cerebral infarction affecting right dominant side: Secondary | ICD-10-CM | POA: Diagnosis not present

## 2019-12-13 DIAGNOSIS — Y92009 Unspecified place in unspecified non-institutional (private) residence as the place of occurrence of the external cause: Secondary | ICD-10-CM | POA: Diagnosis not present

## 2019-12-13 DIAGNOSIS — G8929 Other chronic pain: Secondary | ICD-10-CM | POA: Diagnosis not present

## 2019-12-13 DIAGNOSIS — G20 Parkinson's disease: Secondary | ICD-10-CM | POA: Diagnosis present

## 2019-12-13 DIAGNOSIS — Z87891 Personal history of nicotine dependence: Secondary | ICD-10-CM | POA: Diagnosis not present

## 2019-12-13 DIAGNOSIS — M549 Dorsalgia, unspecified: Secondary | ICD-10-CM | POA: Diagnosis not present

## 2019-12-13 DIAGNOSIS — R05 Cough: Secondary | ICD-10-CM | POA: Diagnosis present

## 2019-12-13 DIAGNOSIS — Z955 Presence of coronary angioplasty implant and graft: Secondary | ICD-10-CM | POA: Diagnosis not present

## 2019-12-13 DIAGNOSIS — I4891 Unspecified atrial fibrillation: Secondary | ICD-10-CM

## 2019-12-13 DIAGNOSIS — Z86010 Personal history of colonic polyps: Secondary | ICD-10-CM | POA: Diagnosis not present

## 2019-12-13 DIAGNOSIS — I714 Abdominal aortic aneurysm, without rupture: Secondary | ICD-10-CM | POA: Diagnosis not present

## 2019-12-13 DIAGNOSIS — S93402A Sprain of unspecified ligament of left ankle, initial encounter: Secondary | ICD-10-CM | POA: Diagnosis not present

## 2019-12-13 DIAGNOSIS — Z20822 Contact with and (suspected) exposure to covid-19: Secondary | ICD-10-CM | POA: Diagnosis not present

## 2019-12-13 DIAGNOSIS — R55 Syncope and collapse: Secondary | ICD-10-CM | POA: Diagnosis not present

## 2019-12-13 DIAGNOSIS — I2510 Atherosclerotic heart disease of native coronary artery without angina pectoris: Secondary | ICD-10-CM | POA: Diagnosis not present

## 2019-12-13 DIAGNOSIS — R42 Dizziness and giddiness: Secondary | ICD-10-CM | POA: Diagnosis present

## 2019-12-13 DIAGNOSIS — Z83438 Family history of other disorder of lipoprotein metabolism and other lipidemia: Secondary | ICD-10-CM | POA: Diagnosis not present

## 2019-12-13 DIAGNOSIS — Z8249 Family history of ischemic heart disease and other diseases of the circulatory system: Secondary | ICD-10-CM | POA: Diagnosis not present

## 2019-12-13 DIAGNOSIS — I10 Essential (primary) hypertension: Secondary | ICD-10-CM | POA: Diagnosis not present

## 2019-12-13 DIAGNOSIS — F209 Schizophrenia, unspecified: Secondary | ICD-10-CM | POA: Diagnosis present

## 2019-12-13 DIAGNOSIS — E872 Acidosis: Secondary | ICD-10-CM | POA: Diagnosis not present

## 2019-12-13 DIAGNOSIS — Z7901 Long term (current) use of anticoagulants: Secondary | ICD-10-CM | POA: Diagnosis not present

## 2019-12-13 DIAGNOSIS — E785 Hyperlipidemia, unspecified: Secondary | ICD-10-CM | POA: Diagnosis not present

## 2019-12-13 DIAGNOSIS — W19XXXA Unspecified fall, initial encounter: Secondary | ICD-10-CM | POA: Diagnosis not present

## 2019-12-13 DIAGNOSIS — W010XXA Fall on same level from slipping, tripping and stumbling without subsequent striking against object, initial encounter: Secondary | ICD-10-CM | POA: Diagnosis present

## 2019-12-13 DIAGNOSIS — F0280 Dementia in other diseases classified elsewhere without behavioral disturbance: Secondary | ICD-10-CM | POA: Diagnosis present

## 2019-12-13 LAB — BASIC METABOLIC PANEL
Anion gap: 10 (ref 5–15)
BUN: 20 mg/dL (ref 8–23)
CO2: 22 mmol/L (ref 22–32)
Calcium: 9.1 mg/dL (ref 8.9–10.3)
Chloride: 107 mmol/L (ref 98–111)
Creatinine, Ser: 1.15 mg/dL (ref 0.61–1.24)
GFR calc Af Amer: >60 mL/min (ref >60)
GFR calc non Af Amer: >60 mL/min (ref >60)
Glucose, Bld: 106 mg/dL — ABNORMAL HIGH (ref 70–99)
Potassium: 3.7 mmol/L (ref 3.5–5.1)
Sodium: 139 mmol/L (ref 135–145)

## 2019-12-13 LAB — CBC
HCT: 39.7 % (ref 39.0–52.0)
Hemoglobin: 12.8 g/dL — ABNORMAL LOW (ref 13.0–17.0)
MCH: 30.2 pg (ref 26.0–34.0)
MCHC: 32.2 g/dL (ref 30.0–36.0)
MCV: 93.6 fL (ref 80.0–100.0)
Platelets: 327 10*3/uL (ref 150–400)
RBC: 4.24 MIL/uL (ref 4.22–5.81)
RDW: 13.2 % (ref 11.5–15.5)
WBC: 7 10*3/uL (ref 4.0–10.5)
nRBC: 0 % (ref 0.0–0.2)

## 2019-12-13 LAB — ECHOCARDIOGRAM COMPLETE
Height: 73 in
Weight: 2779.56 oz

## 2019-12-13 LAB — URINALYSIS, ROUTINE W REFLEX MICROSCOPIC
Bilirubin Urine: NEGATIVE
Glucose, UA: NEGATIVE mg/dL
Hgb urine dipstick: NEGATIVE
Ketones, ur: NEGATIVE mg/dL
Leukocytes,Ua: NEGATIVE
Nitrite: NEGATIVE
Protein, ur: NEGATIVE mg/dL
Specific Gravity, Urine: 1.02 (ref 1.005–1.030)
pH: 5 (ref 5.0–8.0)

## 2019-12-13 MED ORDER — ATORVASTATIN CALCIUM 10 MG PO TABS
10.0000 mg | ORAL_TABLET | Freq: Every day | ORAL | Status: DC
  Administered 2019-12-13: 10 mg via ORAL
  Filled 2019-12-13: qty 1

## 2019-12-13 MED ORDER — SODIUM CHLORIDE 0.9 % IV BOLUS
500.0000 mL | Freq: Once | INTRAVENOUS | Status: AC
  Administered 2019-12-14: 500 mL via INTRAVENOUS

## 2019-12-13 MED ORDER — DILTIAZEM HCL ER COATED BEADS 240 MG PO CP24
240.0000 mg | ORAL_CAPSULE | Freq: Every day | ORAL | Status: DC
  Administered 2019-12-13 – 2019-12-14 (×2): 240 mg via ORAL
  Filled 2019-12-13 (×2): qty 1

## 2019-12-13 MED ORDER — METOPROLOL SUCCINATE ER 100 MG PO TB24
200.0000 mg | ORAL_TABLET | Freq: Every day | ORAL | Status: DC
  Administered 2019-12-13 – 2019-12-14 (×2): 200 mg via ORAL
  Filled 2019-12-13 (×2): qty 2

## 2019-12-13 NOTE — Plan of Care (Signed)
  Problem: Education: Goal: Knowledge of General Education information will improve Description: Including pain rating scale, medication(s)/side effects and non-pharmacologic comfort measures Outcome: Progressing   Problem: Clinical Measurements: Goal: Ability to maintain clinical measurements within normal limits will improve Outcome: Progressing Goal: Will remain free from infection Outcome: Progressing Goal: Diagnostic test results will improve Outcome: Progressing Goal: Respiratory complications will improve Outcome: Progressing Goal: Cardiovascular complication will be avoided Outcome: Progressing   Problem: Activity: Goal: Risk for activity intolerance will decrease Outcome: Progressing   Problem: Nutrition: Goal: Adequate nutrition will be maintained Outcome: Progressing   Problem: Coping: Goal: Level of anxiety will decrease Outcome: Progressing   Problem: Elimination: Goal: Will not experience complications related to bowel motility Outcome: Progressing Goal: Will not experience complications related to urinary retention Outcome: Progressing   Problem: Pain Managment: Goal: General experience of comfort will improve Outcome: Progressing   Problem: Safety: Goal: Ability to remain free from injury will improve Outcome: Progressing   Problem: Skin Integrity: Goal: Risk for impaired skin integrity will decrease Outcome: Progressing   Problem: Education: Goal: Knowledge of disease or condition will improve Outcome: Progressing Goal: Understanding of medication regimen will improve Outcome: Progressing Goal: Individualized Educational Video(s) Outcome: Progressing   Problem: Activity: Goal: Ability to tolerate increased activity will improve Outcome: Progressing   Problem: Cardiac: Goal: Ability to achieve and maintain adequate cardiopulmonary perfusion will improve Outcome: Progressing   Problem: Health Behavior/Discharge Planning: Goal: Ability to  safely manage health-related needs after discharge will improve Outcome: Progressing

## 2019-12-13 NOTE — Progress Notes (Signed)
  Echocardiogram 2D Echocardiogram has been performed.  Wesley [name] F 12/13/2019, 10:25 AM

## 2019-12-13 NOTE — Progress Notes (Signed)
PROGRESS NOTE  Wesley [name]  [id number] DOB: 07/15/1946 DOA: 12/12/2019 PCP: Nolene Ebbs, MD   Brief Narrative: Admitted for AFib with RVR after presenting with falls at home and left ankle pain following a fall.   Assessment & Plan: Active Problems:   A-fib ([place])   Fall at home, initial encounter   Parkinson disease [facility])   Atrial fibrillation with rapid ventricular response ([place])  Multiple falls at home, left ankle sprain: Foot and ankle XR's negative. - PT evaluation, will need walker in lieu of his current cane - Aircast applied, can continue ice, elevation and nonnarcotic medications  Chronic atrial fibrillation with RVR: Rate improved but still uncontrolled this AM. - Start moderate dose diltiazem and wean gtt as able to maintain HR <110bpm. Remains in 110's this AM.  - Continue high dose beta blocker.  - LVEF confirmed to be preserved on echo.   CAD s/p BMS to RCA:  - Continue eliquis, ASA, statin  History of stroke:  - Antiplatelet, statin  PD: At baseline - Continue artane  Schizophrenia: Quiescent currently  Lactic acidosis: Resolved, due to arrhythmia. No evidence of sepsis.  AAA s/p endovascular repair 2015.  DVT prophylaxis: Eliquis Code Status: Full Family Communication: None at bedside Disposition Plan:  Status is: Inpatient  Remains inpatient appropriate because:Hemodynamically unstable, rate improved, though still with RVR while attempting to wean diltiazem infusion   Dispo: The patient is from: Home              Anticipated d/c is to: Home              Anticipated d/c date is: 1 day              Patient currently is not medically stable to d/c.  Consultants:   Dr. Einar Gip by phone  Procedures:   Echocardiogram  Antimicrobials:  None   Subjective: Feels no chest pain, dyspnea, palpitations, lightheadedness, vertigo, leg swelling. Left ankle is tender with movement, improved with ice, stable.  Objective: Vitals:   12/13/19  0900 12/13/19 1000 12/13/19 1100 12/13/19 1520  BP: 117/75 124/67 110/63 (!) 117/105  Pulse: 86 (!) 103 86 82  Resp: (!) 21 18 12 20   Temp:   98 F (36.7 C) 98.2 F (36.8 C)  TempSrc:   Oral Oral  SpO2: 100% 100% 100% 100%  Weight:      Height:        Intake/Output Summary (Last 24 hours) at 12/13/2019 1633 Last data filed at 12/13/2019 1401 Gross per 24 hour  Intake 689.71 ml  Output 400 ml  Net 289.71 ml   Filed Weights   12/12/19 2006 12/13/19 0321  Weight: 78.8 kg 78.8 kg    Gen: 74 y.o. male in no distress   Pulm: Non-labored breathing. Clear to auscultation bilaterally.  CV: Rapid irregular. No murmur, rub, or gallop. No JVD, no dependent edema. GI: Abdomen soft, non-tender, non-distended, with normoactive bowel sounds. No organomegaly or masses felt. Ext: Warm, Left ankle swollen moderately with tenderness bilaterally more laterally. NVI distally. Skin: No rashes, lesions or ulcers Neuro: Alert and oriented. No focal neurological deficits. Psych: Judgement and insight appear normal. Mood & affect appropriate.   Data Reviewed: I have personally reviewed following labs and imaging studies  CBC: Recent Labs  Lab 12/12/19 1047 12/13/19 0207  WBC 8.3 7.0  NEUTROABS 4.7  --   HGB 15.1 12.8*  HCT 47.9 39.7  MCV 97.6 93.6  PLT 388 [id number]   Basic Metabolic  Panel: Recent Labs  Lab 12/12/19 1047 12/13/19 0207  NA 142 139  K 4.1 3.7  CL 107 107  CO2 19* 22  GLUCOSE 102* 106*  BUN 22 20  CREATININE 1.37* 1.15  CALCIUM 9.8 9.1  MG 1.7  --    GFR: Estimated Creatinine Clearance: 61.9 mL/min (by C-G formula based on SCr of 1.15 mg/dL). Liver Function Tests: Recent Labs  Lab 12/12/19 1047  AST 22  ALT 17  ALKPHOS 52  BILITOT 1.1  PROT 7.1  ALBUMIN 3.4*   No results for input(s): LIPASE, AMYLASE in the last 168 hours. No results for input(s): AMMONIA in the last 168 hours. Coagulation Profile: No results for input(s): INR, PROTIME in the last 168  hours. Cardiac Enzymes: No results for input(s): CKTOTAL, CKMB, CKMBINDEX, TROPONINI in the last 168 hours. BNP (last 3 results) No results for input(s): PROBNP in the last 8760 hours. HbA1C: No results for input(s): HGBA1C in the last 72 hours. CBG: No results for input(s): GLUCAP in the last 168 hours. Lipid Profile: No results for input(s): CHOL, HDL, LDLCALC, TRIG, CHOLHDL, LDLDIRECT in the last 72 hours. Thyroid Function Tests: No results for input(s): TSH, T4TOTAL, FREET4, T3FREE, THYROIDAB in the last 72 hours. Anemia Panel: No results for input(s): VITAMINB12, FOLATE, FERRITIN, TIBC, IRON, RETICCTPCT in the last 72 hours. Urine analysis:    Component Value Date/Time   COLORURINE YELLOW 12/13/2019 1400   APPEARANCEUR CLEAR 12/13/2019 1400   LABSPEC 1.020 12/13/2019 1400   PHURINE 5.0 12/13/2019 1400   GLUCOSEU NEGATIVE 12/13/2019 1400   HGBUR NEGATIVE 12/13/2019 1400   BILIRUBINUR NEGATIVE 12/13/2019 1400   KETONESUR NEGATIVE 12/13/2019 1400   PROTEINUR NEGATIVE 12/13/2019 1400   UROBILINOGEN 0.2 02/05/2015 1643   NITRITE NEGATIVE 12/13/2019 1400   LEUKOCYTESUR NEGATIVE 12/13/2019 1400   Recent Results (from the past 240 hour(s))  SARS Coronavirus 2 by RT PCR (hospital order, performed in [facility] hospital lab) Nasopharyngeal Nasopharyngeal Swab     Status: None   Collection Time: 12/12/19 12:31 PM   Specimen: Nasopharyngeal Swab  Result Value Ref Range Status   SARS Coronavirus 2 NEGATIVE NEGATIVE Final    Comment: (NOTE) SARS-CoV-2 target nucleic acids are NOT DETECTED. The SARS-CoV-2 RNA is generally detectable in upper and lower respiratory specimens during the acute phase of infection. The lowest concentration of SARS-CoV-2 viral copies this assay can detect is 250 copies / mL. A negative result does not preclude SARS-CoV-2 infection and should not be used as the sole basis for treatment or other patient management decisions.  A negative result may occur  with improper specimen collection / handling, submission of specimen other than nasopharyngeal swab, presence of viral mutation(s) within the areas targeted by this assay, and inadequate number of viral copies (<250 copies / mL). A negative result must be combined with clinical observations, patient history, and epidemiological information. Fact Sheet for Patients:   StrictlyIdeas.no Fact Sheet for Healthcare Providers: BankingDealers.co.za This test is not yet approved or cleared  by the [place] FDA and has been authorized for detection and/or diagnosis of SARS-CoV-2 by FDA under an Emergency Use Authorization (EUA).  This EUA will remain in effect (meaning this test can be used) for the duration of the COVID-19 declaration under Section 564(b)(1) of the Act, 21 U.S.C. section 360bbb-3(b)(1), unless the authorization is terminated or revoked sooner. Performed at [facility], [place] [address], [place], [place] [address]   MRSA PCR Screening     Status:  None   Collection Time: 12/12/19  8:07 PM   Specimen: Nasal Mucosa; Nasopharyngeal  Result Value Ref Range Status   MRSA by PCR NEGATIVE NEGATIVE Final    Comment:        The GeneXpert MRSA Assay (FDA approved for NASAL specimens only), is one component of a comprehensive MRSA colonization surveillance program. It is not intended to diagnose MRSA infection nor to guide or monitor treatment for MRSA infections. Performed at [facility], [place] [address], [place], [place] [address]   Culture, blood (routine x 2)     Status: None (Preliminary result)   Collection Time: 12/12/19  8:41 PM   Specimen: BLOOD RIGHT HAND  Result Value Ref Range Status   Specimen Description BLOOD RIGHT HAND  Final   Special Requests   Final    BOTTLES DRAWN AEROBIC AND ANAEROBIC Blood Culture results may not be optimal due to an inadequate volume of blood received in culture bottles    Culture   Final    NO GROWTH < 12 HOURS Performed at [facility], [place] [address], [place], [place] [address]    Report Status PENDING  Incomplete  Culture, blood (routine x 2)     Status: None (Preliminary result)   Collection Time: 12/12/19  8:41 PM   Specimen: BLOOD LEFT HAND  Result Value Ref Range Status   Specimen Description BLOOD LEFT HAND  Final   Special Requests   Final    BOTTLES DRAWN AEROBIC AND ANAEROBIC Blood Culture results may not be optimal due to an inadequate volume of blood received in culture bottles   Culture   Final    NO GROWTH < 12 HOURS Performed at [facility], [place] [address], [place], [place] [address]    Report Status PENDING  Incomplete      Radiology Studies: DG Chest 2 View  Result Date: 12/12/2019 CLINICAL DATA:  Fall, right upper lateral back pain EXAM: CHEST - 2 VIEW COMPARISON:  02/05/2015 FINDINGS: Normal heart size. Lungs remain clear. Negative for pneumonia, collapse or consolidation. No edema, effusion, or pneumothorax. Trachea midline. Aorta atherosclerotic. Degenerative changes of the spine. No focal kyphosis. IMPRESSION: Stable chest exam.  No active disease. Aortic Atherosclerosis (ICD10-I70.0). Electronically Signed   By: Jerilynn Mages.  Shick M.D.   On: 12/12/2019 11:13   DG Ankle Complete Left  Result Date: 12/12/2019 CLINICAL DATA:  Fall, lateral ankle pain. EXAM: LEFT ANKLE COMPLETE - 3+ VIEW COMPARISON:  None. FINDINGS: There is no evidence of acute fracture, dislocation, or joint effusion. There is no evidence of arthropathy or other focal bone abnormality. Soft tissues are unremarkable. IMPRESSION: Negative. Electronically Signed   By: Franki Cabot M.D.   On: 12/12/2019 08:44   CT Head Wo Contrast  Result Date: 12/12/2019 CLINICAL DATA:  Head trauma, falls, on anticoagulation EXAM: CT HEAD WITHOUT CONTRAST TECHNIQUE: Contiguous axial images were obtained from the base of the skull through the vertex without intravenous contrast.  COMPARISON:  02/05/2015 FINDINGS: Brain: Diffuse brain atrophy and chronic white matter microvascular changes. Slight progression compared 2016. No acute intracranial hemorrhage mass lesion, definite infarction, midline shift, herniation hydrocephalus, or extra-axial fluid collection. No focal mass effect or edema. Cisterns are patent. Cerebellar atrophy as well. Vascular: Intracranial atherosclerosis.  No hyperdense vessel. Skull: Normal. Negative for fracture or focal lesion. Sinuses/Orbits: No acute finding. Other: None. IMPRESSION: Progressive atrophy pattern without acute intracranial abnormality by noncontrast CT. Electronically Signed   By: Jerilynn Mages.  Shick  M.D.   On: 12/12/2019 11:18   DG Foot Complete Left  Result Date: 12/12/2019 CLINICAL DATA:  Fall, ankle pain. EXAM: LEFT FOOT - COMPLETE 3+ VIEW COMPARISON:  None. FINDINGS: There is no evidence of fracture or dislocation. There is no evidence of arthropathy or other focal bone abnormality. Soft tissues are unremarkable. IMPRESSION: Negative. Electronically Signed   By: Franki Cabot M.D.   On: 12/12/2019 08:43   ECHOCARDIOGRAM COMPLETE  Result Date: 12/13/2019    ECHOCARDIOGRAM REPORT   Patient Name:   USIEL ZUEGE Date of Exam: 12/13/2019 Medical Rec #:  [id number]     Height:       73.0 in Accession #:    [id number]    Weight:       173.7 lb Date of Birth:  [birth year]     BSA:          2.027 m Patient Age:    40 years      BP:           124/67 mmHg Patient Gender: M             HR:           98 bpm. Exam Location:  Inpatient Procedure: 2D Echo, Cardiac Doppler and Color Doppler Indications:    I48.91* Unspeicified atrial fibrillation  History:        Patient has no prior history of Echocardiogram examinations.  Sonographer:    Merrie Roof RDCS Referring Phys: [id number] [place]  1. Left ventricular ejection fraction, by estimation, is 60 to 65%. The left ventricle has normal function. The left ventricle has no regional wall  motion abnormalities. Left ventricular diastolic parameters are indeterminate.  2. Right ventricular systolic function is normal. The right ventricular size is normal. There is normal pulmonary artery systolic pressure.  3. Left atrial size was moderately dilated.  4. The mitral valve is normal in structure. Trivial mitral valve regurgitation. No evidence of mitral stenosis.  5. The aortic valve is tricuspid. Aortic valve regurgitation is trivial. Mild aortic valve sclerosis is present, with no evidence of aortic valve stenosis.  6. The inferior vena cava is normal in size with greater than 50% respiratory variability, suggesting right atrial pressure of 3 mmHg. FINDINGS  Left Ventricle: Left ventricular ejection fraction, by estimation, is 60 to 65%. The left ventricle has normal function. The left ventricle has no regional wall motion abnormalities. The left ventricular internal cavity size was normal in size. There is  no left ventricular hypertrophy. Left ventricular diastolic parameters are indeterminate. Right Ventricle: The right ventricular size is normal. No increase in right ventricular wall thickness. Right ventricular systolic function is normal. There is normal pulmonary artery systolic pressure. The tricuspid regurgitant velocity is 2.82 m/s, and  with an assumed right atrial pressure of 3 mmHg, the estimated right ventricular systolic pressure is [id number]. Left Atrium: Left atrial size was moderately dilated. Right Atrium: Right atrial size was normal in size. Pericardium: There is no evidence of pericardial effusion. Mitral Valve: The mitral valve is normal in structure. There is mild thickening of the mitral valve leaflet(s). There is mild calcification of the mitral valve leaflet(s). Normal mobility of the mitral valve leaflets. Trivial mitral valve regurgitation. No evidence of mitral valve stenosis. Tricuspid Valve: The tricuspid valve is normal in structure. Tricuspid valve regurgitation is  mild . No evidence of tricuspid stenosis. Aortic Valve: The aortic valve is tricuspid. Aortic valve regurgitation is trivial. Mild aortic valve sclerosis  is present, with no evidence of aortic valve stenosis. Pulmonic Valve: The pulmonic valve was normal in structure. Pulmonic valve regurgitation is trivial. No evidence of pulmonic stenosis. Aorta: The aortic root is normal in size and structure. Venous: The inferior vena cava is normal in size with greater than 50% respiratory variability, suggesting right atrial pressure of 3 mmHg. IAS/Shunts: No atrial level shunt detected by color flow Doppler.  LEFT VENTRICLE PLAX 2D LVIDd:         4.30 cm LVIDs:         2.80 cm LV PW:         0.90 cm LV IVS:        1.00 cm LVOT diam:     2.00 cm LV SV:         39 LV SV Index:   19 LVOT Area:     3.14 cm  LV Volumes (MOD) LV vol d, MOD A4C: 62.9 ml LV vol s, MOD A4C: 28.8 ml LV SV MOD A4C:     62.9 ml RIGHT VENTRICLE RV Basal diam:  3.30 cm RV S prime:     14.10 cm/s TAPSE (M-mode): 2.3 cm LEFT ATRIUM              Index       RIGHT ATRIUM           Index LA diam:        5.20 cm  2.57 cm/m  RA Area:     23.90 cm LA Vol (A2C):   116.0 ml 57.24 ml/m RA Volume:   66.50 ml  32.81 ml/m LA Vol (A4C):   93.5 ml  46.14 ml/m LA Biplane Vol: 109.0 ml 53.78 ml/m  AORTIC VALVE LVOT Vmax:   73.10 cm/s LVOT Vmean:  48.400 cm/s LVOT VTI:    0.124 m  AORTA Ao Root diam: 3.70 cm TRICUSPID VALVE TR Peak grad:   31.8 mmHg TR Vmax:        282.00 cm/s  SHUNTS Systemic VTI:  0.12 m Systemic Diam: 2.00 cm Jenkins Rouge MD Electronically signed by Jenkins Rouge MD Signature Date/Time: 12/13/2019/10:32:29 AM    Final     Scheduled Meds: . apixaban  5 mg Oral BID  . aspirin EC  81 mg Oral Daily  . atorvastatin  10 mg Oral q1800  . diltiazem  240 mg Oral Daily  . haloperidol  2.5 mg Oral QHS  . hydrALAZINE  50 mg Oral TID  . hydrochlorothiazide  25 mg Oral Daily  . isosorbide mononitrate  60 mg Oral QHS  . lidocaine  1 patch Transdermal  Q24H  . lisinopril  20 mg Oral Daily  . metoprolol  200 mg Oral Daily  . trihexyphenidyl  2 mg Oral QHS   Continuous Infusions: . diltiazem (CARDIZEM) infusion Stopped (12/13/19 1033)     LOS: 0 days   Time spent: 35 minutes.  [name], MD [facility] [email or web address] 12/13/2019, 4:33 PM

## 2019-12-13 NOTE — Discharge Instructions (Signed)

## 2019-12-13 NOTE — Progress Notes (Signed)
Latest BP 84/63 Map(70). Provider notified about changes in BP. Pt stable. Will continue to monitor.

## 2019-12-13 NOTE — Evaluation (Signed)
Physical Therapy Evaluation Patient Details Name: Wesley [name] MRN: [id number] DOB: 01/24/1946 Today's Date: 12/13/2019   History of Present Illness  74 y.o. male admitted on 12/12/19 for fall down his front stairs with L ankle pain.  X-rays are negative for fx.  Air cast was ordered for L LE.  Pt also found to be in A-fib with RVR requiring cardizem drip.  Pt with significant PMH of stroke, seizure, schizophrenia, HTN, A-fib, chronic back pain, AAA.    Clinical Impression  Pt was able to stand and walk using the RW around his hospital room with min to min guard assist.  I do not believe his cane will be stable enough for gait while his ankle is so tender (and he likely needed a more supportive assistive device in general as he admits to 4 recent falls).  He would benefit from home therapy follow up at discharge for balance and gait training as well as ankle exercise progression.     Follow Up Recommendations Home health PT;Supervision for mobility/OOB    Equipment Recommendations  Rolling walker with 5" wheels;3in1 (PT)    Recommendations for Other Services   NA    Precautions / Restrictions Precautions Precautions: Fall Precaution Comments: Pt reports 4 recent falls Restrictions LLE Weight Bearing: Weight bearing as tolerated(no orders restricting WB )      Mobility  Bed Mobility               General bed mobility comments: Pt was OOB in the recliner chair.   Transfers Overall transfer level: Needs assistance Equipment used: Rolling walker (2 wheeled) Transfers: Sit to/from [facility] Sit to Stand: Min assist Stand pivot transfers: Min guard       General transfer comment: Min assist to stand from chair to RW and from [facility] to RW, cues for safe hand placement.   Ambulation/Gait Ambulation/Gait assistance: Min assist Gait Distance (Feet): 25 Feet Assistive device: Rolling walker (2 wheeled) Gait Pattern/deviations: Step-to pattern;Antalgic;Trunk  flexed Gait velocity: decreased Gait velocity interpretation: <1.8 ft/sec, indicate of risk for recurrent falls General Gait Details: Pt with moderately antalgic gait pattern favoring his left ankle. Cues for upright posture and LE sequencing and safety with turns with RW.  Pt has never used a RW before.          Balance Overall balance assessment: Needs assistance Sitting-balance support: Feet supported;No upper extremity supported Sitting balance-Leahy Scale: Fair Sitting balance - Comments: Pt not able to get to his feet very well, reports he has slip on shoes   Standing balance support: Single extremity supported Standing balance-Leahy Scale: Poor Standing balance comment: reliant on at least one UE supported in standing.  Able to do his own peri care with one hand supported on RW and min guard assist from therapist.                              Pertinent Vitals/Pain Pain Assessment: 0-10 Pain Score: 8  Pain Location: left ankle while walking Pain Descriptors / Indicators: Aching Pain Intervention(s): Limited activity within patient's tolerance;Monitored during session;Repositioned;Ice applied    Home Living Family/patient expects to be discharged to:: Private residence Living Arrangements: Children;Other relatives(son, daughter in law and 3 children) Available Help at Discharge: Family;Available 24 hours/day Type of Home: House Home Access: Stairs to enter Entrance Stairs-Rails: None Entrance Stairs-Number of Steps: 2 Home Layout: Able to live on main level with bedroom/bathroom Home Equipment: Kasandra Knudsen -  quad      Prior Function Level of Independence: Independent with assistive device(s)         Comments: reports h/o frequent falls     Hand Dominance   Dominant Hand: Right    Extremity/Trunk Assessment   Upper Extremity Assessment Upper Extremity Assessment: Defer to OT evaluation    Lower Extremity Assessment Lower Extremity Assessment: LLE  deficits/detail LLE Deficits / Details: limited by pain at L ankle, deminished ROM and strength, able to move it in all directions, but grimaces with pain, decreased WB with gait on this side.     Cervical / Trunk Assessment Cervical / Trunk Assessment: Other exceptions Cervical / Trunk Exceptions: flexed forward lumbar spine in standing, h/o low back pain  Communication   Communication: No difficulties  Cognition Arousal/Alertness: Awake/alert Behavior During Therapy: WFL for tasks assessed/performed Overall Cognitive Status: No family/caregiver present to determine baseline cognitive functioning                                           Exercises General Exercises - Lower Extremity Ankle Circles/Pumps: AROM;Left;20 reps(encouraged every hour)   Assessment/Plan    PT Assessment Patient needs continued PT services  PT Problem List Decreased strength;Decreased range of motion;Decreased activity tolerance;Decreased balance;Decreased mobility;Decreased knowledge of use of DME;Pain       PT Treatment Interventions DME instruction;Stair training;Gait training;Functional mobility training;Therapeutic exercise;Therapeutic activities;Balance training;Patient/family education;Modalities    PT Goals (Current goals can be found in the Care Plan section)  Acute Rehab PT Goals Patient Stated Goal: to "do what I have to do to go home" PT Goal Formulation: With patient Time For Goal Achievement: 12/27/19 Potential to Achieve Goals: Good    Frequency Min 3X/week           AM-PAC PT "6 Clicks" Mobility  Outcome Measure Help needed turning from your back to your side while in a flat bed without using bedrails?: A Little Help needed moving from lying on your back to sitting on the side of a flat bed without using bedrails?: A Little Help needed moving to and from a bed to a chair (including a wheelchair)?: A Little Help needed standing up from a chair using your arms  (e.g., wheelchair or bedside chair)?: A Little Help needed to walk in hospital room?: A Little Help needed climbing 3-5 steps with a railing? : A Little 6 Click Score: 18    End of Session   Activity Tolerance: Patient limited by pain Patient left: in chair;with call bell/phone within reach   PT Visit Diagnosis: Muscle weakness (generalized) (M62.81);Difficulty in walking, not elsewhere classified (R26.2);Pain Pain - Right/Left: Left Pain - part of body: Ankle and joints of foot    Time: [id number] PT Time Calculation (min) (ACUTE ONLY): 23 min   Charges:        Verdene Lennert, PT, DPT  Acute Rehabilitation [phone] pager #[phone] office     PT Evaluation $PT Eval Moderate Complexity: 1 Mod PT Treatments $Gait Training: 8-22 mins       12/13/2019, 2:35 PM

## 2019-12-13 NOTE — Progress Notes (Signed)
Oral Cardizem started, IV stopped. Pt's HR is maintained at 80-100. UA sent. Pt is sitting in a recliner this time, pt is little unstable on his feet, 2 person assist, using cane, in RA, denies chest pain and discomfort, will continue to monitor the patient  Palma Holter, RN

## 2019-12-14 LAB — URINE CULTURE

## 2019-12-14 LAB — TSH: TSH: 1.283 u[IU]/mL (ref 0.350–4.500)

## 2019-12-14 MED ORDER — DILTIAZEM HCL ER COATED BEADS 240 MG PO CP24: 240.0000 mg | ORAL_CAPSULE | Freq: Every day | ORAL | 0 refills | Status: AC

## 2019-12-14 MED ORDER — LISINOPRIL-HYDROCHLOROTHIAZIDE 20-25 MG PO TABS: 0.5000 | ORAL_TABLET | Freq: Every day | ORAL | Status: DC

## 2019-12-14 NOTE — Progress Notes (Signed)
Seen by MD was told to go home. No order for discharge yet, took out his iv line, checked complete and intact. , refused to be in bed prefers to sit in a wheelchair.,changed to street clothes. Refused monitoring . Explained the importance but insisted not to. Daughter -in law at bedside.

## 2019-12-14 NOTE — TOC Initial Note (Signed)
Transition of Care [facility]) - Initial/Assessment Note    Patient Details  Name: Wesley [name] MRN: [id number] Date of Birth: Nov 24, 1945  Transition of Care [facility]) CM/SW Contact:    Bartholomew Crews, RN Phone Number: [phone] 12/14/2019, 11:14 AM  Clinical Narrative:                  Spoke with patient at the bedside. PTA home with family. Has a cane. PT recommendations for HH PT and DME RW and 3N1. Patient declining HH at this time. Verbal permission granted to speak with his daughter in law, Solmon Ice. She is agreeable to DME 3N1 and RW. DME orders requested from MD, and referral placed to [facility] for delivery to room once orders are place. Patient and Felicia aware that PCP can also refer to [facility] for PT if needed at a later date. Family to provide transportation home when cleared for discharge TOC following for transition needs.   Expected Discharge Plan: Home/Self Care Barriers to Discharge: Continued Medical Work up   Patient Goals and CMS Choice Patient states their goals for this hospitalization and ongoing recovery are:: return home CMS Medicare.gov Compare Post Acute Care list provided to:: Patient Choice offered to / list presented to : Patient, Adult Children  Expected Discharge Plan and Services Expected Discharge Plan: Home/Self Care   Discharge Planning Services: CM Consult Post Acute Care Choice: Home Health, Durable Medical Equipment Living arrangements for the past 2 months: Single Family Home                 DME Arranged: Walker rolling DME Agency: [facility] Date DME Agency Contacted: 12/14/19 Time DME Agency Contacted: [id number] Representative spoke with at DME Agency: [place]: [place] Agency: NA        Prior Living Arrangements/Services Living arrangements for the past 2 months: [place] with:: Self, Adult Children Patient language and need for interpreter reviewed:: Yes Do you feel safe going back to the place where you live?:  Yes      Need for Family Participation in Patient Care: Yes (Comment) Care giver support system in place?: Yes (comment)   Criminal Activity/Legal Involvement Pertinent to Current Situation/Hospitalization: No - Comment as needed  Activities of Daily Living Home Assistive Devices/Equipment: Cane (specify quad or straight)(straight) ADL Screening (condition at time of admission) Patient's cognitive ability adequate to safely complete daily activities?: Yes Is the patient deaf or have difficulty hearing?: No Does the patient have difficulty seeing, even when wearing glasses/contacts?: No Does the patient have difficulty concentrating, remembering, or making decisions?: No Patient able to express need for assistance with ADLs?: Yes Does the patient have difficulty dressing or bathing?: No Independently performs ADLs?: Yes (appropriate for developmental age) Does the patient have difficulty walking or climbing stairs?: Yes(R sided weakness') Weakness of Legs: Right Weakness of Arms/Hands: Right  Permission Sought/Granted Permission sought to share information with : Family Supports Permission granted to share information with : Yes, Verbal Permission Granted  Share Information with NAME: Felicia     Permission granted to share info w Relationship: daughter in law     Emotional Assessment Appearance:: Appears stated age Attitude/Demeanor/Rapport: Engaged Affect (typically observed): Accepting Orientation: : Oriented to Self, Oriented to Place, Oriented to  Time, Oriented to Situation Alcohol / Substance Use: Not Applicable Psych Involvement: No (comment)  Admission diagnosis:  A-fib ([place]) [I48.91] Chronic atrial fibrillation ([place]) [I48.20] Near syncope [R55] Atrial fibrillation with RVR ([place]) [I48.91] Fall, initial encounter [W19.XXXA]  Sprain of left ankle, unspecified ligament, initial encounter [S93.402A] Atrial fibrillation with rapid ventricular response ([place])  [I48.91] Patient Active Problem List   Diagnosis Date Noted  . Atrial fibrillation with rapid ventricular response ([place]) 12/13/2019  . A-fib ([place]) 12/12/2019  . Fall at home, initial encounter 12/12/2019  . Parkinson disease ([place]) 12/12/2019  . Atherosclerosis of native coronary artery of native heart with angina pectoris ([place]) 03/12/2019  . Essential hypertension 03/12/2019  . History of repair of aneurysm of abdominal aorta using endovascular stent graft 03/12/2019  . Hyperlipidemia, group A 03/12/2019  . Chronic atrial fibrillation ([place]) 02/06/2015  . AKI (acute kidney injury) ([place]) 02/05/2015  . Syncope 02/05/2015  . Bilateral carotid artery stenosis 03/16/2014  . AAA (abdominal aortic aneurysm) ([place]) 02/01/2014  . S/P PTCA (percutaneous transluminal coronary angioplasty) 01/19/2014  . AAA (abdominal aortic aneurysm) without rupture ([place]) 12/31/2013  . Occlusion and stenosis of carotid artery without mention of cerebral infarction 12/31/2013   PCP:  Nolene Ebbs, MD Pharmacy:   [id number] - [place], [place] - [address] [address] Phone: [phone] Fax: [phone]     Social Determinants of Health (SDOH) Interventions    Readmission Risk Interventions No flowsheet data found.

## 2019-12-14 NOTE — Progress Notes (Signed)
Discharged home accompanied by daughter -in law. Discharge instructions given to pt.. belongings taken home.

## 2019-12-14 NOTE — Progress Notes (Signed)
532mL bolus started.

## 2019-12-14 NOTE — Discharge Summary (Signed)
Physician Discharge Summary  [name] [id number] DOB: July 08, 1946 DOA: 12/12/2019  PCP: Nolene Ebbs, MD  Admit date: 12/12/2019 Discharge date: 12/14/2019  Admitted From: Home Disposition: Home   Recommendations for Outpatient Follow-up:  1. Follow up with PCP in 1-2 weeks 2. Please obtain BMP 3. Follow up with cardiology in the next week for recheck. Started diltiazem for improved heart rate control. This caused soft blood pressures, so lisinopril-HCTZ dose was decreased by half.  Home Health: None (pt declined) Equipment/Devices: Rolling walker, 3 in 1 Discharge Condition: Stable CODE STATUS: Full Diet recommendation: Heart healthy  Brief/Interim Summary: Wesley [name] is a 74 y.o. male who was admitted for AFib with RVR after presenting with falls at home and left ankle pain following a fall. Diltiazem infusion was given with improvement, transitioned to 240mg  po daily with continued stability. His cardiologist was involved in his care and will follow up with him in the office. Radiographs of the ankle and foot were negative. Air cast was applied, DME ordered, and home health PT was recommended but declined by the patient.  Discharge Diagnoses:  Active Problems:   A-fib ([place])   Fall at home, initial encounter   Parkinson disease [facility])   Atrial fibrillation with rapid ventricular response ([place])  Multiple falls at home, left ankle sprain: Foot and ankle XR's negative. - PT evaluation, will need walker in lieu of his current cane - Aircast applied, can continue ice, elevation and nonnarcotic medications  Chronic atrial fibrillation with RVR: Rate improved but still uncontrolled this AM. - Started moderate dose diltiazem and weaned gtt with better control, some hypotension so other meds decreased (lisinopril-HCTZ). - Continue high dose beta blocker.  - LVEF confirmed to be preserved on echo.   CAD s/p BMS to RCA:  - Continue eliquis, ASA, statin  History of stroke:   - Antiplatelet, statin  PD: At baseline - Continue artane  Schizophrenia: Quiescent currently  Lactic acidosis: Resolved, due to arrhythmia. No evidence of sepsis.  AAA s/p endovascular repair 2015.  Discharge Instructions Discharge Instructions    Diet - low sodium heart healthy   Complete by: As directed    Discharge instructions   Complete by: As directed    - Start taking diltiazem once daily to help control your heart rate.  - Decrease your dose of lisinopril-HCTZ. Instead of 1 tablet daily, take half a tablet daily. - Follow up with your PCP and cardiologist soon. - Continue using the aircast on the left ankle until you follow up. It's ok to continue pain medications as you were taking and applying ice.  - Home health services will be arranged for you at discharge   Face-to-face encounter (required for Medicare/Medicaid patients)   Complete by: As directed    I [name] certify that this patient is under my care and that I, or a nurse practitioner or physician's assistant working with me, had a face-to-face encounter that meets the physician face-to-face encounter requirements with this patient on 12/14/2019. The encounter with the patient was in whole, or in part for the following medical condition(s) which is the primary reason for home health care (List medical condition): Left ankle sprain, gait instability, parkinson's disease.   The encounter with the patient was in whole, or in part, for the following medical condition, which is the primary reason for home health care: Left ankle sprain, gait instability, parkinson's disease.   I certify that, based on my findings, the following services are medically necessary home  health services: Physical therapy   Reason for Medically Necessary Home Health Services: Therapy- Home Adaptation to Facilitate Safety   My clinical findings support the need for the above services: Pain interferes with ambulation/mobility   Further, I  certify that my clinical findings support that this patient is homebound due to: Pain interferes with ambulation/mobility   For home use only DME 3 n 1   Complete by: As directed    For home use only DME Walker rolling   Complete by: As directed    Walker: With [place]   Patient needs a walker to treat with the following condition:  Left ankle sprain Parkinson disease ([place]) Gait instability Frequent falls     Home Health   Complete by: As directed    To provide the following care/treatments: PT   Increase activity slowly   Complete by: As directed      Allergies as of 12/14/2019   No Known Allergies     Medication List    TAKE these medications   apixaban 5 MG Tabs tablet Commonly known as: Eliquis Take 1 tablet (5 mg total) by mouth 2 (two) times daily.   diltiazem 240 MG 24 hr capsule Commonly known as: CARDIZEM CD Take 1 capsule (240 mg total) by mouth daily.   haloperidol 5 MG tablet Commonly known as: HALDOL Take 2.5 mg by mouth at bedtime.   hydrALAZINE 50 MG tablet Commonly known as: APRESOLINE Take 1 tablet (50 mg total) by mouth 3 (three) times daily.   isosorbide mononitrate 60 MG 24 hr tablet Commonly known as: IMDUR Take 1 tablet (60 mg total) by mouth at bedtime.   lisinopril-hydrochlorothiazide 20-25 MG tablet Commonly known as: ZESTORETIC Take 0.5 tablets by mouth daily. PLEASE CALL OFFICE FOR FURTHER REFILLS What changed: how much to take   metoprolol 200 MG 24 hr tablet Commonly known as: TOPROL-XL Take 1 tablet (200 mg total) by mouth daily.   simvastatin 20 MG tablet Commonly known as: ZOCOR TAKE 1 TABLET BY MOUTH EVERYDAY AT BEDTIME What changed:   how much to take  how to take this  when to take this  additional instructions   trihexyphenidyl 2 MG tablet Commonly known as: ARTANE Take 2 mg by mouth at bedtime.            Durable Medical Equipment  (From admission, onward)         Start     Ordered   12/14/19  0000  For home use only DME Walker rolling    Question Answer Comment  Walker: With 5 Inch Wheels   Patient needs a walker to treat with the following condition Left ankle sprain   Patient needs a walker to treat with the following condition Parkinson disease ([place])   Patient needs a walker to treat with the following condition Gait instability   Patient needs a walker to treat with the following condition Frequent falls      12/14/19 0856   12/14/19 0000  For home use only DME 3 n 1     12/14/19 0856         Follow-up Information    Nolene Ebbs, MD. Schedule an appointment as soon as possible for a visit.   Specialty: Internal Medicine Contact information: [place] [address] [phone]        Adrian Prows, MD Follow up.   Specialty: Cardiology Contact information: [place] [address] [phone]  No Known Allergies  Consultations:  Discussed with cardiology, Dr. Einar Gip.  Procedures/Studies: DG Chest 2 View  Result Date: 12/12/2019 CLINICAL DATA:  Fall, right upper lateral back pain EXAM: CHEST - 2 VIEW COMPARISON:  02/05/2015 FINDINGS: Normal heart size. Lungs remain clear. Negative for pneumonia, collapse or consolidation. No edema, effusion, or pneumothorax. Trachea midline. Aorta atherosclerotic. Degenerative changes of the spine. No focal kyphosis. IMPRESSION: Stable chest exam.  No active disease. Aortic Atherosclerosis (ICD10-I70.0). Electronically Signed   By: Jerilynn Mages.  Shick M.D.   On: 12/12/2019 11:13   DG Ankle Complete Left  Result Date: 12/12/2019 CLINICAL DATA:  Fall, lateral ankle pain. EXAM: LEFT ANKLE COMPLETE - 3+ VIEW COMPARISON:  None. FINDINGS: There is no evidence of acute fracture, dislocation, or joint effusion. There is no evidence of arthropathy or other focal bone abnormality. Soft tissues are unremarkable. IMPRESSION: Negative. Electronically Signed   By: Franki Cabot M.D.   On:  12/12/2019 08:44   CT Head Wo Contrast  Result Date: 12/12/2019 CLINICAL DATA:  Head trauma, falls, on anticoagulation EXAM: CT HEAD WITHOUT CONTRAST TECHNIQUE: Contiguous axial images were obtained from the base of the skull through the vertex without intravenous contrast. COMPARISON:  02/05/2015 FINDINGS: Brain: Diffuse brain atrophy and chronic white matter microvascular changes. Slight progression compared 2016. No acute intracranial hemorrhage mass lesion, definite infarction, midline shift, herniation hydrocephalus, or extra-axial fluid collection. No focal mass effect or edema. Cisterns are patent. Cerebellar atrophy as well. Vascular: Intracranial atherosclerosis.  No hyperdense vessel. Skull: Normal. Negative for fracture or focal lesion. Sinuses/Orbits: No acute finding. Other: None. IMPRESSION: Progressive atrophy pattern without acute intracranial abnormality by noncontrast CT. Electronically Signed   By: Jerilynn Mages.  Shick M.D.   On: 12/12/2019 11:18   DG Foot Complete Left  Result Date: 12/12/2019 CLINICAL DATA:  Fall, ankle pain. EXAM: LEFT FOOT - COMPLETE 3+ VIEW COMPARISON:  None. FINDINGS: There is no evidence of fracture or dislocation. There is no evidence of arthropathy or other focal bone abnormality. Soft tissues are unremarkable. IMPRESSION: Negative. Electronically Signed   By: Franki Cabot M.D.   On: 12/12/2019 08:43   ECHOCARDIOGRAM COMPLETE  Result Date: 12/13/2019    ECHOCARDIOGRAM REPORT   Patient Name:   Wesley [name] Date of Exam: 12/13/2019 Medical Rec #:  [id number]     Height:       73.0 in Accession #:    [id number]    Weight:       173.7 lb Date of Birth:  1946/04/05     BSA:          2.027 m Patient Age:    74 years      BP:           124/67 mmHg Patient Gender: M             HR:           98 bpm. Exam Location:  Inpatient Procedure: 2D Echo, Cardiac Doppler and Color Doppler Indications:    I48.91* Unspeicified atrial fibrillation  History:        Patient has no prior history  of Echocardiogram examinations.  Sonographer:    Merrie Roof RDCS Referring Phys: [id number] [place]  1. Left ventricular ejection fraction, by estimation, is 60 to 65%. The left ventricle has normal function. The left ventricle has no regional wall motion abnormalities. Left ventricular diastolic parameters are indeterminate.  2. Right ventricular systolic function is normal. The right ventricular size is normal. There is  normal pulmonary artery systolic pressure.  3. Left atrial size was moderately dilated.  4. The mitral valve is normal in structure. Trivial mitral valve regurgitation. No evidence of mitral stenosis.  5. The aortic valve is tricuspid. Aortic valve regurgitation is trivial. Mild aortic valve sclerosis is present, with no evidence of aortic valve stenosis.  6. The inferior vena cava is normal in size with greater than 50% respiratory variability, suggesting right atrial pressure of 3 mmHg. FINDINGS  Left Ventricle: Left ventricular ejection fraction, by estimation, is 60 to 65%. The left ventricle has normal function. The left ventricle has no regional wall motion abnormalities. The left ventricular internal cavity size was normal in size. There is  no left ventricular hypertrophy. Left ventricular diastolic parameters are indeterminate. Right Ventricle: The right ventricular size is normal. No increase in right ventricular wall thickness. Right ventricular systolic function is normal. There is normal pulmonary artery systolic pressure. The tricuspid regurgitant velocity is 2.82 m/s, and  with an assumed right atrial pressure of 3 mmHg, the estimated right ventricular systolic pressure is [id number] mmHg. Left Atrium: Left atrial size was moderately dilated. Right Atrium: Right atrial size was normal in size. Pericardium: There is no evidence of pericardial effusion. Mitral Valve: The mitral valve is normal in structure. There is mild thickening of the mitral valve leaflet(s).  There is mild calcification of the mitral valve leaflet(s). Normal mobility of the mitral valve leaflets. Trivial mitral valve regurgitation. No evidence of mitral valve stenosis. Tricuspid Valve: The tricuspid valve is normal in structure. Tricuspid valve regurgitation is mild . No evidence of tricuspid stenosis. Aortic Valve: The aortic valve is tricuspid. Aortic valve regurgitation is trivial. Mild aortic valve sclerosis is present, with no evidence of aortic valve stenosis. Pulmonic Valve: The pulmonic valve was normal in structure. Pulmonic valve regurgitation is trivial. No evidence of pulmonic stenosis. Aorta: The aortic root is normal in size and structure. Venous: The inferior vena cava is normal in size with greater than 50% respiratory variability, suggesting right atrial pressure of 3 mmHg. IAS/Shunts: No atrial level shunt detected by color flow Doppler.  LEFT VENTRICLE PLAX 2D LVIDd:         4.30 cm LVIDs:         2.80 cm LV PW:         0.90 cm LV IVS:        1.00 cm LVOT diam:     2.00 cm LV SV:         39 LV SV Index:   19 LVOT Area:     3.14 cm  LV Volumes (MOD) LV vol d, MOD A4C: 62.9 ml LV vol s, MOD A4C: 28.8 ml LV SV MOD A4C:     62.9 ml RIGHT VENTRICLE RV Basal diam:  3.30 cm RV S prime:     14.10 cm/s TAPSE (M-mode): 2.3 cm LEFT ATRIUM              Index       RIGHT ATRIUM           Index LA diam:        5.20 cm  2.57 cm/m  RA Area:     23.90 cm LA Vol (A2C):   116.0 ml 57.24 ml/m RA Volume:   66.50 ml  32.81 ml/m LA Vol (A4C):   93.5 ml  46.14 ml/m LA Biplane Vol: 109.0 ml 53.78 ml/m  AORTIC VALVE LVOT Vmax:   73.10 cm/s LVOT Vmean:  48.400  cm/s LVOT VTI:    0.124 m  AORTA Ao Root diam: 3.70 cm TRICUSPID VALVE TR Peak grad:   31.8 mmHg TR Vmax:        282.00 cm/s  SHUNTS Systemic VTI:  0.12 m Systemic Diam: 2.00 cm Jenkins Rouge MD Electronically signed by Jenkins Rouge MD Signature Date/Time: 12/13/2019/10:32:29 AM    Final      Subjective: Feels well, wants to go home. No chest  pain, dyspnea, palpitations, lightheadedness, falls. Rate has been controlled on current regimen.  Discharge Exam: Vitals:   12/14/19 0717 12/14/19 0724  BP: 117/81   Pulse:    Resp: 20   Temp: 98.2 F (36.8 C)   SpO2:  98%   General: Pt is alert, awake, not in acute distress Cardiovascular: Irreg irreg. S1/S2 +, no rubs, no gallops Respiratory: CTA bilaterally, no wheezing, no rhonchi Abdominal: Soft, NT, ND, bowel sounds + Extremities: No edema, no cyanosis  Labs: BNP (last 3 results) Recent Labs    12/12/19 2016  BNP 123456*   Basic Metabolic Panel: Recent Labs  Lab 12/12/19 1047 12/13/19 0207  NA 142 139  K 4.1 3.7  CL 107 107  CO2 19* 22  GLUCOSE 102* 106*  BUN 22 20  CREATININE 1.37* 1.15  CALCIUM 9.8 9.1  MG 1.7  --    Liver Function Tests: Recent Labs  Lab 12/12/19 1047  AST 22  ALT 17  ALKPHOS 52  BILITOT 1.1  PROT 7.1  ALBUMIN 3.4*   No results for input(s): LIPASE, AMYLASE in the last 168 hours. No results for input(s): AMMONIA in the last 168 hours. CBC: Recent Labs  Lab 12/12/19 1047 12/13/19 0207  WBC 8.3 7.0  NEUTROABS 4.7  --   HGB 15.1 12.8*  HCT 47.9 39.7  MCV 97.6 93.6  PLT 388 327   Cardiac Enzymes: No results for input(s): CKTOTAL, CKMB, CKMBINDEX, TROPONINI in the last 168 hours. BNP: Invalid input(s): POCBNP CBG: No results for input(s): GLUCAP in the last 168 hours. D-Dimer No results for input(s): DDIMER in the last 72 hours. Hgb A1c No results for input(s): HGBA1C in the last 72 hours. Lipid Profile No results for input(s): CHOL, HDL, LDLCALC, TRIG, CHOLHDL, LDLDIRECT in the last 72 hours. Thyroid function studies Recent Labs    12/14/19 0223  TSH 1.283   Anemia work up No results for input(s): VITAMINB12, FOLATE, FERRITIN, TIBC, IRON, RETICCTPCT in the last 72 hours. Urinalysis    Component Value Date/Time   COLORURINE YELLOW 12/13/2019 1400   APPEARANCEUR CLEAR 12/13/2019 1400   LABSPEC 1.020  12/13/2019 1400   PHURINE 5.0 12/13/2019 1400   GLUCOSEU NEGATIVE 12/13/2019 1400   HGBUR NEGATIVE 12/13/2019 1400   BILIRUBINUR NEGATIVE 12/13/2019 1400   KETONESUR NEGATIVE 12/13/2019 1400   PROTEINUR NEGATIVE 12/13/2019 1400   UROBILINOGEN 0.2 02/05/2015 1643   NITRITE NEGATIVE 12/13/2019 1400   LEUKOCYTESUR NEGATIVE 12/13/2019 1400    Microbiology Recent Results (from the past 240 hour(s))  SARS Coronavirus 2 by RT PCR (hospital order, performed in [place] hospital lab) Nasopharyngeal Nasopharyngeal Swab     Status: None   Collection Time: 12/12/19 12:31 PM   Specimen: Nasopharyngeal Swab  Result Value Ref Range Status   SARS Coronavirus 2 NEGATIVE NEGATIVE Final    Comment: (NOTE) SARS-CoV-2 target nucleic acids are NOT DETECTED. The SARS-CoV-2 RNA is generally detectable in upper and lower respiratory specimens during the acute phase of infection. The lowest concentration of SARS-CoV-2 viral copies this  assay can detect is 250 copies / mL. A negative result does not preclude SARS-CoV-2 infection and should not be used as the sole basis for treatment or other patient management decisions.  A negative result may occur with improper specimen collection / handling, submission of specimen other than nasopharyngeal swab, presence of viral mutation(s) within the areas targeted by this assay, and inadequate number of viral copies (<250 copies / mL). A negative result must be combined with clinical observations, patient history, and epidemiological information. Fact Sheet for Patients:   StrictlyIdeas.no Fact Sheet for Healthcare Providers: BankingDealers.co.za This test is not yet approved or cleared  by the [place] FDA and has been authorized for detection and/or diagnosis of SARS-CoV-2 by FDA under an Emergency Use Authorization (EUA).  This EUA will remain in effect (meaning this test can be used) for the duration of  the COVID-19 declaration under Section 564(b)(1) of the Act, 21 U.S.C. section 360bbb-3(b)(1), unless the authorization is terminated or revoked sooner. Performed at [facility], [place] [address], [place], [place] [address]   MRSA PCR Screening     Status: None   Collection Time: 12/12/19  8:07 PM   Specimen: Nasal Mucosa; Nasopharyngeal  Result Value Ref Range Status   MRSA by PCR NEGATIVE NEGATIVE Final    Comment:        The GeneXpert MRSA Assay (FDA approved for NASAL specimens only), is one component of a comprehensive MRSA colonization surveillance program. It is not intended to diagnose MRSA infection nor to guide or monitor treatment for MRSA infections. Performed at [facility], [place] [address] [place], [place] [address]   Culture, blood (routine x 2)     Status: None (Preliminary result)   Collection Time: 12/12/19  8:41 PM   Specimen: BLOOD RIGHT HAND  Result Value Ref Range Status   Specimen Description BLOOD RIGHT HAND  Final   Special Requests   Final    BOTTLES DRAWN AEROBIC AND ANAEROBIC Blood Culture results may not be optimal due to an inadequate volume of blood received in culture bottles   Culture   Final    NO GROWTH < 12 HOURS Performed at [facility], [place] [address], [place], [place] [address]    Report Status PENDING  Incomplete  Culture, blood (routine x 2)     Status: None (Preliminary result)   Collection Time: 12/12/19  8:41 PM   Specimen: BLOOD LEFT HAND  Result Value Ref Range Status   Specimen Description BLOOD LEFT HAND  Final   Special Requests   Final    BOTTLES DRAWN AEROBIC AND ANAEROBIC Blood Culture results may not be optimal due to an inadequate volume of blood received in culture bottles   Culture   Final    NO GROWTH < 12 HOURS Performed at [facility], [place] [address], [place],  [address]    Report Status PENDING  Incomplete    Time coordinating discharge: Approximately 40 minutes  [name], MD  [facility] 12/14/2019, 8:58 AM

## 2019-12-17 LAB — CULTURE, BLOOD (ROUTINE X 2)
Culture: NO GROWTH
Culture: NO GROWTH

## 2019-12-21 NOTE — Progress Notes (Signed)
Primary Physician/Referring:  Nolene Ebbs, MD  Patient ID: Wesley [name], male    DOB: 13-Mar-1946, 74 y.o.   MRN: [id number]  Chief Complaint  Patient presents with  . Atrial Fibrillation  . Coronary Artery Disease  . Follow-up    Hospital 7-10 day   HPI:    Wesley [name]  is a 74 y.o. male  with history of hypertension, hyperlipidemia and history of presenile dementia, Parkinsons, permanent A. Fibrillation, CAD and h/o baremetal stenting to the RCA on 01/19/2013. He also has endovascular abdominal aortic aneurysm repair by Dr. Oneida Alar on 02/01/2014, and left carotid endarterectomy on 03/16/2014. He also has prior h/o stroke left brain and residual mild right hemiparesis.   He was admitted to the hospital on 12/12/2019 and discharged on 12/14/2019 after he had fallen down and had left ankle sprain, was also found to be in A. fib with RVR and started on combination of low-dose metoprolol and also diltiazem and discharged home on 12/16/2019.  He now presents here for follow-up.  Presently doing well and remains asymptomatic.   Past Medical History:  Diagnosis Date  . AAA (abdominal aortic aneurysm) ([place])   . Arthritis    "legs" (01/19/2014)  . Back pain    reason unknown  . Carotid artery occlusion   . Chronic back pain    "neck to tailbone" (01/19/2014)  . Coronary artery disease   . Dysphagia   . Dysrhythmia    Atrial Fib-takes Atenolol daily  . History of blood transfusion   . History of bronchitis 53yrs ago  . History of colon polyps   . Hyperlipidemia    takes Zocor daily  . Hypertension    takes Clonidine,Apresoline,Imdur,and Lisinopril daily  . Schizophrenia ([place])    takes Haldol daily  . Seizures ([place])    has had one several yrs ago but never been on meds  . Stroke [facility]) 04/16/1986   takes Plavix daily as well as Savaysa;right sided weakness   Past Surgical History:  Procedure Laterality Date  . ABDOMINAL AORTIC ENDOVASCULAR STENT GRAFT N/A 02/01/2014   Procedure:  ABDOMINAL AORTIC ENDOVASCULAR STENT GRAFT;  Surgeon: Elam Dutch, MD;  Location: [facility];  Service: Vascular;  Laterality: N/A;  . CARPAL TUNNEL RELEASE Right   . COLONOSCOPY    . CORONARY ANGIOPLASTY WITH STENT PLACEMENT  01/19/2014   "1"  . ENDARTERECTOMY Left 03/16/2014   Procedure: LEFT CAROTID ENDARTERECTOMY WITH PATCH ANGIOPLASTY ;  Surgeon: Elam Dutch, MD;  Location: [place];  Service: Vascular;  Laterality: Left;  . EYE SURGERY Bilateral    cataract surgery  . LEFT HEART CATHETERIZATION WITH CORONARY ANGIOGRAM N/A 01/19/2014   Procedure: LEFT HEART CATHETERIZATION WITH CORONARY ANGIOGRAM;  Surgeon: Laverda Page, MD;  Location: [facility] CATH LAB;  Service: Cardiovascular;  Laterality: N/A;  . PERCUTANEOUS CORONARY STENT INTERVENTION (PCI-S)  01/19/2014   Procedure: PERCUTANEOUS CORONARY STENT INTERVENTION (PCI-S);  Surgeon: Laverda Page, MD;  Location: [facility] CATH LAB;  Service: Cardiovascular;;   Family History  Problem Relation Age of Onset  . Cancer Father   . Hyperlipidemia Father   . Hypertension Father   . Heart disease Sister   . AAA (abdominal aortic aneurysm) Sister     Social History   Tobacco Use  . Smoking status: Former Smoker    Packs/day: 1.00    Years: 58.00    Pack years: 58.00    Types: Cigarettes  . Smokeless tobacco: Never Used  . Tobacco comment: quit smoking  a couple of yrs ago  Substance Use Topics  . Alcohol use: No    Alcohol/week: 0.0 standard drinks   Marital Status: Widowed  ROS  Review of Systems  Cardiovascular: Negative for dyspnea on exertion, leg swelling and syncope.  Gastrointestinal: Negative for melena.  Neurological: Positive for loss of balance and tremors.  Psychiatric/Behavioral: Positive for memory loss.   Objective  Blood pressure 110/62, pulse 71, resp. rate 16, height 6\' 1"  (1.854 m), weight 175 lb 14.4 oz (79.8 kg), SpO2 100 %.  Vitals with BMI 12/23/2019 12/14/2019 12/14/2019  Height 6\' 1"  - -  Weight 175 lbs 14 oz  - -  BMI [id number] - -  Systolic [id number] - 99991111  Diastolic 62 - 71  Pulse 71 99 -     Physical Exam  Constitutional: Vital signs are normal. He appears well-developed and well-nourished.  HENT:  Head: Normocephalic.  Cardiovascular: Normal rate, normal heart sounds and intact distal pulses. An irregularly irregular rhythm present.  Pulses:      Femoral pulses are 2+ on the right side and 2+ on the left side.      Popliteal pulses are 1+ on the right side and 1+ on the left side.       Dorsalis pedis pulses are 1+ on the right side and 2+ on the left side.       Posterior tibial pulses are 1+ on the right side and 1+ on the left side.  Pulmonary/Chest: Effort normal and breath sounds normal. No accessory muscle usage. No respiratory distress.  Abdominal: Soft. Bowel sounds are normal.  Vitals reviewed.  Laboratory examination:   Recent Labs    12/12/19 1047 12/13/19 0207  NA 142 139  K 4.1 3.7  CL 107 107  CO2 19* 22  GLUCOSE 102* 106*  BUN 22 20  CREATININE 1.37* 1.15  CALCIUM 9.8 9.1  GFRNONAA 50* >60  GFRAA 58* >60   estimated creatinine clearance is 62.6 mL/min (by C-G formula based on SCr of 1.15 mg/dL).  CMP Latest Ref Rng & Units 12/13/2019 12/12/2019 02/06/2015  Glucose 70 - 99 mg/dL 106(H) 102(H) 95  BUN 8 - 23 mg/dL 20 22 66(H)  Creatinine 0.61 - 1.24 mg/dL 1.15 1.37(H) 2.78(H)  Sodium 135 - 145 mmol/L 139 142 138  Potassium 3.5 - 5.1 mmol/L 3.7 4.1 4.6  Chloride 98 - 111 mmol/L 107 107 109  CO2 22 - 32 mmol/L 22 19(L) 21(L)  Calcium 8.9 - 10.3 mg/dL 9.1 9.8 9.2  Total Protein 6.5 - 8.1 g/dL - 7.1 -  Total Bilirubin 0.3 - 1.2 mg/dL - 1.1 -  Alkaline Phos 38 - 126 U/L - 52 -  AST 15 - 41 U/L - 22 -  ALT 0 - 44 U/L - 17 -   CBC Latest Ref Rng & Units 12/13/2019 12/12/2019 02/06/2015  WBC 4.0 - 10.5 K/uL 7.0 8.3 6.2  Hemoglobin 13.0 - 17.0 g/dL 12.8(L) 15.1 13.5  Hematocrit 39.0 - 52.0 % 39.7 47.9 39.0  Platelets 150 - 400 K/uL 327 388 241   Lipid Panel  No results  found for: CHOL, TRIG, HDL, CHOLHDL, VLDL, LDLCALC, LDLDIRECT HEMOGLOBIN A1C No results found for: HGBA1C, MPG TSH Recent Labs    12/14/19 0223  TSH 1.283   External labs:  Cholesterol, total 127.000 M 02/19/2018 Triglycerides 86.000 M 02/19/2018 HDL 37.000 M 02/19/2018 LDL-C 73.000 M 02/19/2018  Medications and allergies  No Known Allergies   Current Outpatient Medications  Medication Instructions  .  apixaban (ELIQUIS) 5 mg, Oral, 2 times daily  . diltiazem (CARDIZEM CD) 240 mg, Oral, Daily  . haloperidol (HALDOL) 2.5 mg, Oral, Daily at bedtime  . hydrALAZINE (APRESOLINE) 50 mg, Oral, 3 times daily  . isosorbide mononitrate (IMDUR) 60 mg, Oral, Daily at bedtime  . lisinopril-hydrochlorothiazide (ZESTORETIC) 20-25 MG tablet 0.5 tablets, Oral, Daily, PLEASE CALL OFFICE FOR FURTHER REFILLS  . metoprolol (TOPROL-XL) 200 mg, Oral, Daily  . simvastatin (ZOCOR) 20 MG tablet TAKE 1 TABLET BY MOUTH EVERYDAY AT BEDTIME  . trihexyphenidyl (ARTANE) 2 mg, Oral, Daily at bedtime   Radiology:   No results found.  Cardiac Studies:   Lexiscan Myoview stress test 10/23/2013: 1. Resting EKG A. Fibrillation with controlled ventricular response, LVH, diffuse nonspecific ST-T wave changes, PVC. Stress EKG was non diagnostic for ischemia. No ST-T changes of ischemia noted with pharmacologic stress testing. Stress symptoms included right arm pain. Stress terminated due to completion of protocol. 2. The perfusion study demonstrated a moderate-sized severe ischemia in the mid inferior wall and inferolateral wall. The ischemia was statistically significant with a sum score difference of 4. Dynamic QGS images revealed the Left ventricular ejection fraction was estimated to be 61%. This represents at least intermediate risk With patient developing left arm pain and markedly abnormal perfusion abnormality.  Echo 11/12/2013: 1. Left ventricle cavity is normal in size. Mild Concentric hypertrophy of the left  ventricle. Normal global wall motion. Calculated EF is 62% No wall motion abnormalities. Left atrial cavity is mildly dilated. Right atrial cavity is mildly dilated. 2. Moderate mitral regurgitation. Mild calcification of the mitral valve annulus. 3. Normal tricuspid valve with moderate regurgitation. Mild to moderate pulmonary hypertension with approx. PA syst. pressure of 40 mm of Hg.  Coronary angiogram 01/19/2014: Mid RCA 4x24 mm Rebel Non-DES stent. Mild disease left. EF 55%  EVAR Duplex 08/27/2017:  AAA sac size: 3.1 cm; Right CIA: 1.5 cm; Left CIA: 1.5 cm   no endoleak detected  Previous (05-24-16): 3.7 cm   Vascular US Duplex 08/27/2017: Abdominal Aorta: The largest aortic measurement is 3.1 cm. The largest aortic diameter has decreased compared to prior exam. Previous diameter  measurement was 3.7 cm obtained on 05/24/16.   Carotid Duplex 08/27/2017: Right ICA: 1-39% stenosis Left ICA: (CEA site) 1-39% stenosis Bilateral vertebral artery flow is antegrade.  Bilateral subclavian artery waveforms are normal.  Decrease in stenosis category of the right ICA compared to the exam on 05-24-16.   Echocardiogram 12/13/2019: 1. Left ventricular ejection fraction, by estimation, is 60 to 65%. The left ventricle has normal function. The left ventricle has no regional wall motion abnormalities. Left ventricular diastolic parameters are indeterminate.  2. Right ventricular systolic function is normal. The right ventricular size is normal. There is normal pulmonary artery systolic pressure.  3. Left atrial size was moderately dilated.  4. The mitral valve is normal in structure. Trivial mitral valve regurgitation. No evidence of mitral stenosis.  5. The aortic valve is tricuspid. Aortic valve regurgitation is trivial. Mild aortic valve sclerosis is present, with no evidence of aortic valve stenosis.  6. The inferior vena cava is normal in size with greater than 50% respiratory variability,  suggesting right atrial pressure of 3 mmHg.   EKG   EKG 12/23/2019: Atrial fibrillation with controlled ventricular response at the rate of 83 bpm, normal axis, no evidence of ischemia. Compared to 12/13/2019, RVR not present and compared to 05/04/2019, no significant change.   Assessment     ICD-10-CM  1. Permanent atrial fibrillation ([place]). CHA2DS2-VASc Score is 6.  Yearly risk of stroke: 9.8% (A, Vasc Dz, HTN, Stroke).   I48.21 EKG 12-Lead  2. Abdominal aortic aneurysm (AAA) without rupture ([facility])  I71.4   3. Carotid stenosis, bilateral  I65.23   4. Atherosclerosis of native coronary artery of native heart without angina pectoris  I25.10   5. Pure hypercholesterolemia  E78.00      No orders of the defined types were placed in this encounter.   There are no discontinued medications.   Recommendations:   Wesley [name]  is a 74 y.o.  male  with history of hypertension, hyperlipidemia and history of presenile dementia, Parkinsons, permanent A. Fibrillation, CAD and h/o baremetal stenting to the RCA on 01/19/2013. He also has endovascular abdominal aortic aneurysm repair by Dr. Oneida Alar on 02/01/2014, and left carotid endarterectomy on 03/16/2014. He also has prior h/o stroke left brain and residual mild right hemiparesis.   He was admitted to the hospital on 12/12/2019 and discharged on 12/14/2019 after he had fallen down and had left ankle sprain, was also found to be in A. fib with RVR.  Since being on combination therapy with calcium channel blocker and a beta-blocker, heart rate is well controlled today.  In view of high cardioembolic risk, would recommend continuing anticoagulation for now.  Blood pressure is well controlled, he has no significant carotid disease and his SP left carotid endarterectomy and no change in physical exam.  He is SP AAA repair with EVAR and no change in his physical exam.  I will see him back in 6 months for follow-up.  I reviewed his labs and external records from  his recent hospitalization.  Although I do not have his recent labs, this is being managed by his PCP Dr. Jeanie Cooks.  Adrian Prows, MD, [facility] 12/27/2019, 11:50 AM [facility] Pager: [phone] Office: [phone]

## 2019-12-23 ENCOUNTER — Encounter: Payer: Self-pay | Admitting: Cardiology

## 2019-12-23 ENCOUNTER — Ambulatory Visit: Payer: Medicare Other | Admitting: Cardiology

## 2019-12-23 ENCOUNTER — Other Ambulatory Visit: Payer: Self-pay

## 2019-12-23 VITALS — BP 110/62 | HR 71 | Resp 16 | Ht 73.0 in | Wt 175.9 lb

## 2019-12-23 DIAGNOSIS — I4821 Permanent atrial fibrillation: Secondary | ICD-10-CM | POA: Diagnosis not present

## 2019-12-23 DIAGNOSIS — I714 Abdominal aortic aneurysm, without rupture, unspecified: Secondary | ICD-10-CM

## 2019-12-23 DIAGNOSIS — I2510 Atherosclerotic heart disease of native coronary artery without angina pectoris: Secondary | ICD-10-CM | POA: Diagnosis not present

## 2019-12-23 DIAGNOSIS — E7800 Pure hypercholesterolemia, unspecified: Secondary | ICD-10-CM

## 2019-12-23 DIAGNOSIS — I6523 Occlusion and stenosis of bilateral carotid arteries: Secondary | ICD-10-CM

## 2020-01-22 ENCOUNTER — Other Ambulatory Visit: Payer: Self-pay

## 2020-01-22 ENCOUNTER — Ambulatory Visit (INDEPENDENT_AMBULATORY_CARE_PROVIDER_SITE_OTHER): Payer: Medicare Other | Admitting: Podiatry

## 2020-01-22 ENCOUNTER — Encounter: Payer: Self-pay | Admitting: Podiatry

## 2020-01-22 DIAGNOSIS — B351 Tinea unguium: Secondary | ICD-10-CM

## 2020-01-22 DIAGNOSIS — M79676 Pain in unspecified toe(s): Secondary | ICD-10-CM

## 2020-01-22 DIAGNOSIS — D689 Coagulation defect, unspecified: Secondary | ICD-10-CM

## 2020-01-22 NOTE — Progress Notes (Signed)
This patient returns to my office for at risk foot care.  This patient requires this care by a professional since this patient will be at risk due to having kidney injury and coagulation defect due to taking eliquis  This patient is unable to cut nails himself since the patient cannot reach his nails.These nails are painful walking and wearing shoes. Patient has not been seen in over 10 months. This patient presents for at risk foot care today.  General Appearance  Alert, conversant and in no acute stress.  Vascular  Dorsalis pedis and posterior tibial  pulses are palpable  bilaterally.  Capillary return is within normal limits  bilaterally. Temperature is within normal limits  bilaterally.  Neurologic  Senn-Weinstein monofilament wire test within normal limits  bilaterally. Muscle power within normal limits bilaterally.  Nails Thick disfigured discolored nails with subungual debris  from hallux to fifth toes bilaterally. No evidence of bacterial infection or drainage bilaterally.  Orthopedic  No limitations of motion  feet .  No crepitus or effusions noted.  No bony pathology or digital deformities noted.  Skin  normotropic skin with no porokeratosis noted bilaterally.  No signs of infections or ulcers noted.   Asymptomatic callus  B/L.  Onychomycosis  Pain in right toes  Pain in left toes  Consent was obtained for treatment procedures.   Mechanical debridement of nails 1-5  bilaterally performed with a nail nipper.  Filed with dremel without incident.    Return office visit   3 months                   Told patient to return for periodic foot care and evaluation due to potential at risk complications.   Gardiner Barefoot DPM

## 2020-04-27 ENCOUNTER — Encounter: Payer: Self-pay | Admitting: Podiatry

## 2020-04-27 ENCOUNTER — Other Ambulatory Visit: Payer: Self-pay

## 2020-04-27 ENCOUNTER — Ambulatory Visit (INDEPENDENT_AMBULATORY_CARE_PROVIDER_SITE_OTHER): Payer: Medicare Other | Admitting: Podiatry

## 2020-04-27 DIAGNOSIS — D689 Coagulation defect, unspecified: Secondary | ICD-10-CM | POA: Diagnosis not present

## 2020-04-27 DIAGNOSIS — M79676 Pain in unspecified toe(s): Secondary | ICD-10-CM

## 2020-04-27 DIAGNOSIS — B351 Tinea unguium: Secondary | ICD-10-CM

## 2020-04-27 NOTE — Progress Notes (Signed)
This patient returns to my office for at risk foot care.  This patient requires this care by a professional since this patient will be at risk due to having kidney injury and coagulation defect due to taking eliquis  This patient is unable to cut nails himself since the patient cannot reach his nails.These nails are painful walking and wearing shoes. . This patient presents for at risk foot care today.  General Appearance  Alert, conversant and in no acute stress.  Vascular  Dorsalis pedis and posterior tibial  pulses are palpable  bilaterally.  Capillary return is within normal limits  bilaterally. Temperature is within normal limits  bilaterally.  Neurologic  Senn-Weinstein monofilament wire test within normal limits  bilaterally. Muscle power within normal limits bilaterally.  Nails Thick disfigured discolored nails with subungual debris  from hallux to fifth toes bilaterally. No evidence of bacterial infection or drainage bilaterally.  Orthopedic  No limitations of motion  feet .  No crepitus or effusions noted.  No bony pathology or digital deformities noted.  Skin  normotropic skin with no porokeratosis noted bilaterally.  No signs of infections or ulcers noted.   Asymptomatic callus  Sub 5th right..  Onychomycosis  Pain in right toes  Pain in left toes  Consent was obtained for treatment procedures.   Mechanical debridement of nails 1-5  bilaterally performed with a nail nipper.  Filed with dremel without incident.    Return office visit   3 months                   Told patient to return for periodic foot care and evaluation due to potential at risk complications.   Gardiner Barefoot DPM

## 2020-05-03 ENCOUNTER — Ambulatory Visit: Payer: Commercial Indemnity | Admitting: Cardiology

## 2020-05-22 ENCOUNTER — Other Ambulatory Visit: Payer: Self-pay | Admitting: Cardiology

## 2020-06-17 ENCOUNTER — Other Ambulatory Visit: Payer: Self-pay | Admitting: Cardiology

## 2020-06-17 DIAGNOSIS — I482 Chronic atrial fibrillation, unspecified: Secondary | ICD-10-CM

## 2020-07-27 ENCOUNTER — Encounter: Payer: Self-pay | Admitting: Podiatry

## 2020-07-27 ENCOUNTER — Other Ambulatory Visit: Payer: Self-pay

## 2020-07-27 ENCOUNTER — Ambulatory Visit (INDEPENDENT_AMBULATORY_CARE_PROVIDER_SITE_OTHER): Payer: Medicare Other | Admitting: Podiatry

## 2020-07-27 DIAGNOSIS — B351 Tinea unguium: Secondary | ICD-10-CM | POA: Diagnosis not present

## 2020-07-27 DIAGNOSIS — D689 Coagulation defect, unspecified: Secondary | ICD-10-CM | POA: Diagnosis not present

## 2020-07-27 DIAGNOSIS — M79676 Pain in unspecified toe(s): Secondary | ICD-10-CM | POA: Diagnosis not present

## 2020-07-27 NOTE — Progress Notes (Signed)
This patient returns to my office for at risk foot care.  This patient requires this care by a professional since this patient will be at risk due to having kidney injury and coagulation defect due to taking eliquis  This patient is unable to cut nails himself since the patient cannot reach his nails.These nails are painful walking and wearing shoes. . This patient presents for at risk foot care today.  General Appearance  Alert, conversant and in no acute stress.  Vascular  Dorsalis pedis and posterior tibial  pulses are palpable  bilaterally.  Capillary return is within normal limits  bilaterally. Temperature is within normal limits  bilaterally.  Neurologic  Senn-Weinstein monofilament wire test within normal limits  bilaterally. Muscle power within normal limits bilaterally.  Nails Thick disfigured discolored nails with subungual debris  from hallux to fifth toes bilaterally. No evidence of bacterial infection or drainage bilaterally.  Orthopedic  No limitations of motion  feet .  No crepitus or effusions noted.  No bony pathology or digital deformities noted.  Skin  normotropic skin with no porokeratosis noted bilaterally.  No signs of infections or ulcers noted.   Asymptomatic callus  Sub 5th right..  Onychomycosis  Pain in right toes  Pain in left toes  Consent was obtained for treatment procedures.   Mechanical debridement of nails 1-5  bilaterally performed with a nail nipper.  Filed with dremel without incident.    Return office visit   3 months                   Told patient to return for periodic foot care and evaluation due to potential at risk complications.   Zionah Criswell DPM  

## 2020-10-03 ENCOUNTER — Other Ambulatory Visit: Payer: Self-pay

## 2020-10-03 DIAGNOSIS — E7800 Pure hypercholesterolemia, unspecified: Secondary | ICD-10-CM

## 2020-10-03 MED ORDER — APIXABAN 5 MG PO TABS: 5.0000 mg | ORAL_TABLET | Freq: Two times a day (BID) | ORAL | 0 refills | Status: AC

## 2020-10-03 MED ORDER — APIXABAN 5 MG PO TABS: 5.0000 mg | ORAL_TABLET | Freq: Two times a day (BID) | ORAL | 1 refills | Status: DC

## 2020-10-03 MED ORDER — SIMVASTATIN 20 MG PO TABS: ORAL_TABLET | ORAL | 0 refills | Status: AC

## 2020-10-26 ENCOUNTER — Ambulatory Visit: Payer: Medicare Other | Admitting: Podiatry

## 2020-11-14 ENCOUNTER — Telehealth: Payer: Self-pay | Admitting: Podiatry

## 2020-11-14 NOTE — Telephone Encounter (Signed)
Patient is moving and has requested referral for new location, patient has asked for recommendation for new location in [place]

## 2020-11-14 NOTE — Telephone Encounter (Signed)
The child of the following patient has requested a referral for podiatry. Currently in the process of moving to [facility] and will like to establish care there, Please Advise

## 2020-11-23 ENCOUNTER — Telehealth: Payer: Self-pay | Admitting: *Deleted

## 2020-11-23 NOTE — Telephone Encounter (Signed)
Returned call to patient's son, no answer, left vmessage for call back concerning a referral from doctor for a new Podiatrist.

## 2020-12-17 ENCOUNTER — Other Ambulatory Visit: Payer: Self-pay | Admitting: Cardiology

## 2020-12-23 ENCOUNTER — Other Ambulatory Visit: Payer: Self-pay | Admitting: Cardiology

## 2021-05-29 ENCOUNTER — Other Ambulatory Visit: Payer: Self-pay | Admitting: Cardiology

## 2021-05-29 DIAGNOSIS — E7800 Pure hypercholesterolemia, unspecified: Secondary | ICD-10-CM

## 2022-05-09 ENCOUNTER — Other Ambulatory Visit: Payer: Self-pay | Admitting: Cardiology
